# Patient Record
Sex: Female | Born: 1965
Health system: Southern US, Community
[De-identification: ages and names within clinical notes are randomized; demographics above are authoritative.]

## PROBLEM LIST (undated history)

## (undated) DIAGNOSIS — F32A Depression, unspecified: Secondary | ICD-10-CM

## (undated) DIAGNOSIS — Z923 Personal history of irradiation: Secondary | ICD-10-CM

## (undated) DIAGNOSIS — M2550 Pain in unspecified joint: Secondary | ICD-10-CM

## (undated) DIAGNOSIS — R7303 Prediabetes: Secondary | ICD-10-CM

## (undated) DIAGNOSIS — F329 Major depressive disorder, single episode, unspecified: Secondary | ICD-10-CM

## (undated) DIAGNOSIS — D649 Anemia, unspecified: Secondary | ICD-10-CM

## (undated) DIAGNOSIS — K219 Gastro-esophageal reflux disease without esophagitis: Secondary | ICD-10-CM

## (undated) DIAGNOSIS — M1990 Unspecified osteoarthritis, unspecified site: Secondary | ICD-10-CM

## (undated) DIAGNOSIS — Z8669 Personal history of other diseases of the nervous system and sense organs: Secondary | ICD-10-CM

## (undated) DIAGNOSIS — C50919 Malignant neoplasm of unspecified site of unspecified female breast: Secondary | ICD-10-CM

## (undated) DIAGNOSIS — M549 Dorsalgia, unspecified: Secondary | ICD-10-CM

## (undated) DIAGNOSIS — F419 Anxiety disorder, unspecified: Secondary | ICD-10-CM

## (undated) DIAGNOSIS — Z9889 Other specified postprocedural states: Secondary | ICD-10-CM

## (undated) DIAGNOSIS — R079 Chest pain, unspecified: Secondary | ICD-10-CM

## (undated) DIAGNOSIS — E669 Obesity, unspecified: Secondary | ICD-10-CM

## (undated) DIAGNOSIS — I10 Essential (primary) hypertension: Secondary | ICD-10-CM

## (undated) DIAGNOSIS — R232 Flushing: Secondary | ICD-10-CM

## (undated) DIAGNOSIS — Z8049 Family history of malignant neoplasm of other genital organs: Secondary | ICD-10-CM

## (undated) DIAGNOSIS — R600 Localized edema: Secondary | ICD-10-CM

## (undated) DIAGNOSIS — J302 Other seasonal allergic rhinitis: Secondary | ICD-10-CM

## (undated) DIAGNOSIS — C801 Malignant (primary) neoplasm, unspecified: Secondary | ICD-10-CM

## (undated) HISTORY — DX: Pain in unspecified joint: M25.50

## (undated) HISTORY — DX: Prediabetes: R73.03

## (undated) HISTORY — DX: Dorsalgia, unspecified: M54.9

## (undated) HISTORY — DX: Anxiety disorder, unspecified: F41.9

## (undated) HISTORY — PX: DILATION AND CURETTAGE OF UTERUS: SHX78

## (undated) HISTORY — DX: Obesity, unspecified: E66.9

## (undated) HISTORY — DX: Other specified postprocedural states: Z98.890

## (undated) HISTORY — DX: Essential (primary) hypertension: I10

## (undated) HISTORY — PX: REDUCTION MAMMAPLASTY: SUR839

## (undated) HISTORY — DX: Unspecified osteoarthritis, unspecified site: M19.90

## (undated) HISTORY — PX: ABDOMINAL HYSTERECTOMY: SUR658

## (undated) HISTORY — DX: Flushing: R23.2

## (undated) HISTORY — PX: TUBAL LIGATION: SHX77

## (undated) HISTORY — DX: Family history of malignant neoplasm of other genital organs: Z80.49

## (undated) HISTORY — DX: Localized edema: R60.0

## (undated) HISTORY — DX: Chest pain, unspecified: R07.9

## (undated) HISTORY — PX: COLONOSCOPY: SHX174

---

## 1997-06-09 ENCOUNTER — Other Ambulatory Visit: Admission: RE | Admit: 1997-06-09 | Discharge: 1997-06-09 | Payer: Self-pay | Admitting: Obstetrics and Gynecology

## 1997-06-26 ENCOUNTER — Encounter: Admission: RE | Admit: 1997-06-26 | Discharge: 1997-09-24 | Payer: Self-pay | Admitting: Obstetrics and Gynecology

## 1997-10-03 ENCOUNTER — Ambulatory Visit (HOSPITAL_COMMUNITY): Admission: RE | Admit: 1997-10-03 | Discharge: 1997-10-03 | Payer: Self-pay | Admitting: Obstetrics and Gynecology

## 1997-11-07 ENCOUNTER — Observation Stay (HOSPITAL_COMMUNITY): Admission: AD | Admit: 1997-11-07 | Discharge: 1997-11-08 | Payer: Self-pay | Admitting: *Deleted

## 1997-12-04 ENCOUNTER — Inpatient Hospital Stay (HOSPITAL_COMMUNITY): Admission: AD | Admit: 1997-12-04 | Discharge: 1997-12-04 | Payer: Self-pay | Admitting: Obstetrics & Gynecology

## 1997-12-11 ENCOUNTER — Inpatient Hospital Stay (HOSPITAL_COMMUNITY): Admission: AD | Admit: 1997-12-11 | Discharge: 1997-12-13 | Payer: Self-pay | Admitting: Obstetrics and Gynecology

## 1997-12-14 ENCOUNTER — Inpatient Hospital Stay (HOSPITAL_COMMUNITY): Admission: AD | Admit: 1997-12-14 | Discharge: 1997-12-18 | Payer: Self-pay | Admitting: *Deleted

## 1998-01-22 ENCOUNTER — Other Ambulatory Visit: Admission: RE | Admit: 1998-01-22 | Discharge: 1998-01-22 | Payer: Self-pay | Admitting: Physician Assistant

## 1999-02-08 ENCOUNTER — Other Ambulatory Visit: Admission: RE | Admit: 1999-02-08 | Discharge: 1999-02-08 | Payer: Self-pay | Admitting: Obstetrics and Gynecology

## 2000-02-15 ENCOUNTER — Other Ambulatory Visit: Admission: RE | Admit: 2000-02-15 | Discharge: 2000-02-15 | Payer: Self-pay | Admitting: Obstetrics and Gynecology

## 2000-05-01 ENCOUNTER — Encounter: Payer: Self-pay | Admitting: Obstetrics and Gynecology

## 2000-05-01 ENCOUNTER — Ambulatory Visit (HOSPITAL_COMMUNITY): Admission: RE | Admit: 2000-05-01 | Discharge: 2000-05-03 | Payer: Self-pay | Admitting: Obstetrics and Gynecology

## 2000-05-01 ENCOUNTER — Ambulatory Visit (HOSPITAL_COMMUNITY): Admission: RE | Admit: 2000-05-01 | Discharge: 2000-05-01 | Payer: Self-pay | Admitting: Obstetrics and Gynecology

## 2001-02-21 ENCOUNTER — Other Ambulatory Visit: Admission: RE | Admit: 2001-02-21 | Discharge: 2001-02-21 | Payer: Self-pay | Admitting: Obstetrics and Gynecology

## 2002-04-11 ENCOUNTER — Other Ambulatory Visit: Admission: RE | Admit: 2002-04-11 | Discharge: 2002-04-11 | Payer: Self-pay | Admitting: Obstetrics and Gynecology

## 2003-03-12 ENCOUNTER — Ambulatory Visit (HOSPITAL_COMMUNITY): Admission: RE | Admit: 2003-03-12 | Discharge: 2003-03-12 | Payer: Self-pay | Admitting: Obstetrics and Gynecology

## 2003-03-31 ENCOUNTER — Ambulatory Visit (HOSPITAL_COMMUNITY): Admission: RE | Admit: 2003-03-31 | Discharge: 2003-03-31 | Payer: Self-pay | Admitting: Obstetrics and Gynecology

## 2003-05-09 ENCOUNTER — Ambulatory Visit (HOSPITAL_COMMUNITY): Admission: RE | Admit: 2003-05-09 | Discharge: 2003-05-09 | Payer: Self-pay | Admitting: Obstetrics and Gynecology

## 2003-06-30 ENCOUNTER — Inpatient Hospital Stay (HOSPITAL_COMMUNITY): Admission: AD | Admit: 2003-06-30 | Discharge: 2003-06-30 | Payer: Self-pay | Admitting: Obstetrics and Gynecology

## 2003-07-07 ENCOUNTER — Inpatient Hospital Stay (HOSPITAL_COMMUNITY): Admission: AD | Admit: 2003-07-07 | Discharge: 2003-07-07 | Payer: Self-pay | Admitting: Obstetrics and Gynecology

## 2003-07-09 ENCOUNTER — Ambulatory Visit (HOSPITAL_COMMUNITY): Admission: RE | Admit: 2003-07-09 | Discharge: 2003-07-09 | Payer: Self-pay | Admitting: Obstetrics and Gynecology

## 2003-07-15 ENCOUNTER — Inpatient Hospital Stay (HOSPITAL_COMMUNITY): Admission: RE | Admit: 2003-07-15 | Discharge: 2003-07-18 | Payer: Self-pay | Admitting: Obstetrics and Gynecology

## 2003-07-15 ENCOUNTER — Encounter (INDEPENDENT_AMBULATORY_CARE_PROVIDER_SITE_OTHER): Payer: Self-pay | Admitting: *Deleted

## 2003-07-19 ENCOUNTER — Encounter: Admission: RE | Admit: 2003-07-19 | Discharge: 2003-08-18 | Payer: Self-pay | Admitting: Obstetrics and Gynecology

## 2003-08-29 ENCOUNTER — Other Ambulatory Visit: Admission: RE | Admit: 2003-08-29 | Discharge: 2003-08-29 | Payer: Self-pay | Admitting: Obstetrics and Gynecology

## 2004-12-20 ENCOUNTER — Other Ambulatory Visit: Admission: RE | Admit: 2004-12-20 | Discharge: 2004-12-20 | Payer: Self-pay | Admitting: Obstetrics and Gynecology

## 2004-12-23 ENCOUNTER — Encounter: Admission: RE | Admit: 2004-12-23 | Discharge: 2004-12-23 | Payer: Self-pay | Admitting: Obstetrics and Gynecology

## 2005-12-23 ENCOUNTER — Other Ambulatory Visit: Admission: RE | Admit: 2005-12-23 | Discharge: 2005-12-23 | Payer: Self-pay | Admitting: Obstetrics and Gynecology

## 2006-01-03 ENCOUNTER — Encounter: Admission: RE | Admit: 2006-01-03 | Discharge: 2006-01-03 | Payer: Self-pay | Admitting: Obstetrics and Gynecology

## 2007-01-09 ENCOUNTER — Encounter: Admission: RE | Admit: 2007-01-09 | Discharge: 2007-01-09 | Payer: Self-pay | Admitting: *Deleted

## 2008-01-14 ENCOUNTER — Encounter: Admission: RE | Admit: 2008-01-14 | Discharge: 2008-01-14 | Payer: Self-pay | Admitting: Obstetrics and Gynecology

## 2008-01-23 ENCOUNTER — Encounter: Admission: RE | Admit: 2008-01-23 | Discharge: 2008-01-23 | Payer: Self-pay | Admitting: Obstetrics and Gynecology

## 2008-12-04 ENCOUNTER — Ambulatory Visit (HOSPITAL_COMMUNITY): Admission: RE | Admit: 2008-12-04 | Discharge: 2008-12-04 | Payer: Self-pay | Admitting: Surgery

## 2008-12-09 ENCOUNTER — Encounter: Admission: RE | Admit: 2008-12-09 | Discharge: 2009-03-09 | Payer: Self-pay | Admitting: Surgery

## 2008-12-22 ENCOUNTER — Ambulatory Visit (HOSPITAL_COMMUNITY): Admission: RE | Admit: 2008-12-22 | Discharge: 2008-12-22 | Payer: Self-pay | Admitting: Surgery

## 2009-01-22 ENCOUNTER — Encounter: Admission: RE | Admit: 2009-01-22 | Discharge: 2009-01-22 | Payer: Self-pay | Admitting: Obstetrics and Gynecology

## 2009-01-27 ENCOUNTER — Ambulatory Visit (HOSPITAL_COMMUNITY): Admission: RE | Admit: 2009-01-27 | Discharge: 2009-01-27 | Payer: Self-pay | Admitting: Surgery

## 2009-01-27 HISTORY — PX: LAPAROSCOPIC GASTRIC BANDING: SHX1100

## 2009-02-06 ENCOUNTER — Encounter: Admission: RE | Admit: 2009-02-06 | Discharge: 2009-02-06 | Payer: Self-pay | Admitting: Obstetrics and Gynecology

## 2009-02-11 ENCOUNTER — Encounter: Admission: RE | Admit: 2009-02-11 | Discharge: 2009-02-11 | Payer: Self-pay | Admitting: Surgery

## 2009-03-31 ENCOUNTER — Encounter: Admission: RE | Admit: 2009-03-31 | Discharge: 2009-06-29 | Payer: Self-pay | Admitting: Surgery

## 2009-08-07 ENCOUNTER — Encounter: Admission: RE | Admit: 2009-08-07 | Discharge: 2009-08-07 | Payer: Self-pay | Admitting: Obstetrics and Gynecology

## 2009-08-07 HISTORY — PX: BIOPSY BREAST: PRO8

## 2010-02-08 ENCOUNTER — Encounter: Admission: RE | Admit: 2010-02-08 | Discharge: 2010-02-08 | Payer: Self-pay | Admitting: Obstetrics and Gynecology

## 2010-03-24 ENCOUNTER — Ambulatory Visit: Admit: 2010-03-24 | Payer: Self-pay | Admitting: Vascular Surgery

## 2010-05-12 ENCOUNTER — Encounter (INDEPENDENT_AMBULATORY_CARE_PROVIDER_SITE_OTHER): Payer: BC Managed Care – PPO | Admitting: Vascular Surgery

## 2010-05-12 DIAGNOSIS — I83893 Varicose veins of bilateral lower extremities with other complications: Secondary | ICD-10-CM

## 2010-05-13 NOTE — Consult Note (Signed)
NEW PATIENT CONSULTATION  ICEL, CASTLES DOB:  1966/02/04                                       05/12/2010 ZOXWR#:[id number]  The patient presents today for evaluation of bilateral knee pathology with right greater than left.  She is a very pleasant 45 year old white female with increasing severe discomfort in her right medial calf.  She reports an itching, aching sensation in her medial calf overlying some tributary varicosities in this region.  She does report swelling more so on the right than the left as well.  She does not have any history of superficial thrombophlebitis, bleeding or deep venous thrombosis.  This is worse with prolonged standing.  She does have a history of hypertension and has undergone surgery to include lap band, 2 cesarean sections and 2 D and C.  SOCIAL HISTORY:  She is married with 2 children.  She works in Airline pilot. She does not smoke or drink alcohol.  FAMILY HISTORY:  Significant for heart valve disease in her mother and also severe venous varicosities in her mother.  REVIEW OF SYSTEMS:  Weight loss down to 246 pounds.  She is 5 feet 6 inches tall.  Her review of systems is otherwise noncontributory or negative except for HPI.  PHYSICAL EXAM:  General:  Well-developed, well-nourished white female in no acute distress.  Vital signs:  Blood pressure 132/83, pulse 60, respirations 18.  HEENT:  Normal.  Her radial and dorsalis pedis pulses are 2+ bilaterally.  Musculoskeletal:  Shows no major deformity or cyanosis.  Neurologic:  No focal weakness or paresthesias.  Skin: Without ulcers or rashes.  She does have some tributary varicosities in her right medial calf and some prominent reticular veins over the pretibial area.  She does have a prior duplex done at [facility] in [facility].  This reveals reflux throughout her saphenous veins bilaterally.  She does not have any evidence of deep venous reflux.  She has worn  knee-high compression but not thigh-high compression.  We have explained the importance of this and I have fitted her with these today.  We will see her again in 3 months for continued evaluation.  I do feel that she would be at outstanding candidate for right leg laser ablation and stab phlebectomy should she have failure with conservative treatment.  We will discuss this with her further in 3 months.    Larina Earthly, M.D. Electronically Signed  TFE/MEDQ  D:  05/12/2010  T:  05/13/2010  Job:  [id number]  cc:   Sigmund Hazel, M.D.

## 2010-06-16 LAB — DIFFERENTIAL
Eosinophils Absolute: 0.1 10*3/uL (ref 0.0–0.7)
Eosinophils Relative: 1 % (ref 0–5)
Lymphs Abs: 1.9 10*3/uL (ref 0.7–4.0)
Monocytes Absolute: 0.3 10*3/uL (ref 0.1–1.0)
Monocytes Relative: 5 % (ref 3–12)

## 2010-06-16 LAB — COMPREHENSIVE METABOLIC PANEL
ALT: 21 U/L (ref 0–35)
AST: 19 U/L (ref 0–37)
Albumin: 3.8 g/dL (ref 3.5–5.2)
Calcium: 9.2 mg/dL (ref 8.4–10.5)
GFR calc Af Amer: >60 mL/min (ref >60)
Sodium: 138 mEq/L (ref 135–145)
Total Protein: 7.5 g/dL (ref 6.0–8.3)

## 2010-06-16 LAB — CBC
MCHC: 33.3 g/dL (ref 30.0–36.0)
RDW: 12.7 % (ref 11.5–15.5)

## 2010-07-30 NOTE — Discharge Summary (Signed)
NAME:  [name] [name], [name] [name]                        ACCOUNT NO.:  [id number]   MEDICAL RECORD NO.:  [id number]                   PATIENT TYPE:  INP   LOCATION:  9110                                 FACILITY:  [facility]   PHYSICIAN:  Huel Cote, M.D.              DATE OF BIRTH:  [birth year]   DATE OF ADMISSION:  07/15/2003  DATE OF DISCHARGE:  07/18/2003                                 DISCHARGE SUMMARY   DISCHARGE DIAGNOSES:  1. Preeclampsia at [redacted] weeks gestation.  2. Desires permanent sterility.  3. Polyhydramnios.  4. Status post repeat low transverse cesarean section and bilateral tubal     ligation.   DISCHARGE MEDICATIONS:  .  1. Motrin 600 mg p.o. q.6h. p.r.n.  2. Percocet one to two tablets p.o. q.4h. p.r.n.  3. Zoloft 50 mg p.o. q.h.s.   DISCHARGE FOLLOW-UP:  The patient is to follow up in the office in  approximately 2 weeks for an incision check.   HOSPITAL COURSE:  The patient was a 45 year old female who was admitted  after a scheduled repeat low transverse cesarean section for an indication  of preeclampsia and [redacted] weeks gestation.  The patient had been being followed  closely with her diagnosis of preeclampsia which developed at approximately  34-and-a-half to [redacted] weeks gestation with proteinuria and elevated blood  pressures.  She was followed with nonstress tests, labs, and careful  monitoring two to three times weekly.  At [redacted] weeks gestation it was felt  that the risk of preterm status was negated by the need to deliver this  preeclamptic patient and she was brought in for C-section.  She underwent a  repeat low transverse cesarean section with tubal ligation.  She was  delivered of a viable female infant, Apgars were 7 and 7, weight was 6 pounds  4 ounces.  Cord pH was 7.31.  The patient's blood pressures dropped  substantially after cesarean section to 113/67 from the 150s to 160s over  90s to 100.  All her preeclamptic labs remained normal except for a uric  acid of  5.7.  Therefore, we observed her blood pressure closely and her  urine output closely postoperatively and did not start magnesium given that  these parameters remained very stable.  On postoperative day #1 she was  doing well, she had no preeclamptic symptoms.  She had excellent urine  output and was diuresing over 1400 mL in a shift.  Her labs remained normal  and blood pressures maxed out at 140s over 80s.  She continued to do very  well postpartum and on postoperative day #3 was able to go home.  Unfortunately, the baby did require a NICU stay for respiratory assistance;  however, was doing well upon mom's discharge on minimal oxygen requirements.  Therefore, the patient was discharged to home to follow up in the office in  2 weeks for her routine postpartum incision check.  Huel Cote, M.D.    KR/MEDQ  D:  08/14/2003  T:  08/14/2003  Job:  [id number]

## 2010-07-30 NOTE — Op Note (Signed)
NAME:  [name] [name], [name] [name]                        ACCOUNT NO.:  [id number]   MEDICAL RECORD NO.:  [id number]                   PATIENT TYPE:  INP   LOCATION:  9110                                 FACILITY:  [facility]   PHYSICIAN:  Huel Cote, M.D.              DATE OF BIRTH:  [birth year]   DATE OF PROCEDURE:  07/15/2003  DATE OF DISCHARGE:                                 OPERATIVE REPORT   PREOPERATIVE DIAGNOSIS:  1. Pre-eclampsia.  2. Preterm pregnancy at 36 plus weeks.  3. Desires permanent sterility.   POSTOPERATIVE DIAGNOSIS:  1. Pre-eclampsia.  2. Preterm pregnancy at 36 plus weeks.  3. Desires permanent sterility.   PROCEDURE:  Repeat low transverse cesarean section with bilateral tubal  ligation.   SURGEON:  Huel Cote, M.D.   ASSISTANT:  Malachi Pro. Ambrose Mantle, M.D.   ANESTHESIA:  Spinal.   ESTIMATED BLOOD LOSS:  1000 mL.   URINE OUTPUT:  200 mL clear urine.   FLUIDS REPLACED:  2200 mL.   FINDINGS:  Normal uterus, tubes, and ovaries.  The infant was in the vertex  presentation, however, was floating out of the pelvis and the head was  deflexed which did require vacuum assisted delivery.  Apgars were 7 and 7.  Weight 6 pounds 4 ounces.  Cord pH was 7.31.  The infant was taken to the  pediatric NICU after delivery for some grunting and retracting.   DESCRIPTION OF PROCEDURE:  The patient was taken to the operating room where  spinal anesthesia was obtained without difficulty.  She was then prepped and  draped in the usual sterile fashion in the dorsal supine position with a  leftward tilt.  A Pfannenstiel skin incision was made with the scalpel and  carried through to the underlying layer of fascia with sharp dissection with  Bovie cautery.  The fascia was nicked in the midline and the incision was  extended laterally with Mayo scissors.  The inferior aspect of the incision  was grasped with Kocher clamps, elevated, and dissected off the underlying  rectus muscles.   The superior aspect was elevated and dissected off the  rectus muscles.  The rectus muscles were separated in the midline and the  peritoneal cavity identified bluntly.  The peritoneal incision was then  extended superiorly and inferiorly with care to avoid the bowel and bladder.  The bladder blade was inserted and the vesicouterine peritoneum identified  and the bladder flap created with Metzenbaum scissors.  The uterus was  incised in a transverse fashion with the scalpel and the uterine cavity  entered bluntly.  The uterine segment was extended with bandage scissors and  the infant's vertex identified and found to be in a deflexed position.  The  vertex was gently guided to the uterine incision and vacuum was attempted to  be applied.  One pop-off occurred secondary to excessive fluid in the field  from the patient's polyhydramnios and  some blood loss.  When this was  adequately suctioned, the vacuum was reapplied to the infant's vertex and  the baby's head delivered atraumatically.  The remainder of the infant was  delivered without difficulty.  The cord was clamped and cut, and the infant  handed to the awaiting pediatricians for evaluation.  Cord pH and cord blood  were obtained and handed off to the lab.  The placenta was delivered  manually and the uterus cleared of all clots and debris with a moist, lap  sponge.  Attempts were made to exteriorize the uterus which was slightly  difficulty given it was slightly bulk and boggy, therefore, the incision was  inspected and found to be reasonably hemostatic and well visualized.  Therefore, the uterus was left in place.  The bladder blade was inserted.  The uterine incision was repaired with 0 chromic in a running locked  fashion.  Good hemostasis was noted after closure and attention was turned  to the fallopian tubes.  The patient was, once again, asked whether she  wished to proceed with tubal ligation and confirmed that she did.  At  this  point, neither myself or patient had been notified that the baby had  required the NICU.  The left fallopian tube was identified, grasped with a  Babcock clamp, and elevated and approximately 2-3 buckle of tube, this was  tied off with two ties of 0 plain suture.  The buckle that had been tied off  was then amputated with Metzenbaum scissors and handed off to pathology.  The remaining pedicle was inspected and cauterized with Bovie cautery.  No  active bleeding was noted.  Attention was turned to the right fallopian tube  which, in a similar fashion, was grasped with a Babcock clamp, and elevated  and approximately 2-3 cm segment of tube elevated and tied off with two  sutures of 0 plain suture.  This was then amputated and handed off to  pathology.  The pedicle was cauterized with Bovie cautery and appeared  hemostatic.  Both pedicles were returned to the abdomen and the uterine  incision inspected.  One small area of bleeding along the left angle was  controlled with a figure-of-eight suture of 0 chromic.  At this point, there  was on active bleeding noted.  The abdomen and pelvis were irrigated and all  clots and debris were removed.  The patient's rectus muscles were  reapproximated with several interrupted sutures of 0 Vicryl.  An additional  0 Vicryl was obtained and the fascia was closed in a running fashion.  The  subcutaneous tissues were reapproximated with 0 plain in a running fashion.  The skin was closed with staples.  The sponge, lap, and instrument counts  were correct x 2.  The patient was taken to the recovery room in stable  condition.  The NICU was contacted after reaching the recovery room and said  the baby was doing well on the CPAP, however, had some grunting and was  being observed for possible need of Surfactant.                                               Huel Cote, M.D.   KR/MEDQ  D:  07/15/2003  T:  07/15/2003  Job:  [id number]

## 2010-07-30 NOTE — H&P (Signed)
NAME:  [name] [name], [name] [name]                        ACCOUNT NO.:  [id number]   MEDICAL RECORD NO.:  [id number]                   PATIENT TYPE:  INP   LOCATION:  NA                                   FACILITY:  [facility]   PHYSICIAN:  Huel Cote, M.D.              DATE OF BIRTH:  09/02/1965   DATE OF ADMISSION:  07/15/2003  DATE OF DISCHARGE:                                HISTORY & PHYSICAL   [name] is a 45 year old G5, P1-0-1-1, who is admitted for a scheduled  repeat cesarean section and bilateral tubal ligation at 36+ weeks, given a  diagnosis of ongoing preeclampsia.  [name] due date is Aug 11, 2003,  via nine-week ultrasound.  Her prenatal care had been complicated advanced  maternal age, and she was seen by Dr. Sherrie George for first trimester screening,  which was within normal limits.  She also had a normal anatomical scan at 18  weeks' gestation.  The patient also had a prior pregnancy with a cesarean  section performed for arrest of dilation and had pregnancy-induced  hypertension with that pregnancy.  During this pregnancy she has desired a  repeat C-section with no desire for trial of labor.  Also, with her prenatal  care this pregnancy she had an ongoing problem with depression and anxiety  and was placed on Zoloft 50 mg p.o. daily and had good improvement on that  medication of her symptoms.  She was also seen by Justus Memory, a  counselor at [facility].  The patient began to have some elevations  in her blood pressure noted at 31 weeks, usually running in the 140s-  150s/90s.  Fundal height has consistently measured greater than dates.  She  had a higher amniotic fluid volume level on ultrasound performed at 32 weeks  of an AFI of 25, and fetus in the 75th-90th percentile.  Given that she was  electing to have a repeat cesarean section, further ultrasounds were not  performed.  Since her original blood pressure elevations, the patient has  been followed closely in  the office with twice-weekly visits.  At 35 weeks  she was noted to develop proteinuria, and her blood pressures were slightly  higher at 180s/90s; however, these would usually resolve with rest down to  140s/90s.  A 24-hour urine was performed on April 25 given her new  proteinuria, and a diagnosis of preeclampsia was confirmed with a total  protein over 24 hours of 798 mg.  The patient has continued to remain  asymptomatic, and lab checks weekly have remained normal.  She does have  significant edema and has had a 20+ pound weight gain over the last month.  Given her diagnosis of preeclampsia, the decision has been made to proceed  with delivery after 36 weeks to prevent any worsening of her disease.   PAST OBSTETRICAL HISTORY:  Significant for her previous cesarean section,  which was in 1999,  of an 8 pound infant at 38 weeks. She did have PIH with  that pregnancy.  She has also had a miscarriage in 1998.   PAST MEDICAL HISTORY:  1. History of postpartum depression and the depression/anxiety noted with     this pregnancy.  2. She also has reflux disease.   PAST SURGICAL HISTORY:  Significant for cesarean section and D&C.   PHYSICAL EXAMINATION:  VITAL SIGNS:  The patient's weight is 334 pounds,  blood pressure is 165/95, fundal height is 40.  CARDIAC:  Regular rate and rhythm.  CHEST:  Lungs are clear.  ABDOMEN:  Gravid and nontender, slightly obese.  EXTREMITIES:  The patient has 3+ pitting edema, and DTRs are 2+ bilaterally.   PIH labs will be performed upon the day of admission with her surgery, at  last check were normal on July 07, 2003.  The patient was counseled as to  the risks and benefits of proceeding with cesarean section, including  bleeding and infection and possible damage to bowel and bladder.  The  patient understands these risks and desires to proceed with the surgery as  stated.  The patient also requests a bilateral tubal ligation be performed  should  everything appear okay with the baby at delivery.  She understands  there is a risk of failure of approximately one in 100 and also understands  that she should notify us immediately should she suspect pregnancy or missed  period.  Again, the patient wishes to proceed with delivery as stated.  We  will follow her blood pressures and watch her lab status to determine the  need for postpartum magnesium.                                               Huel Cote, M.D.    KR/MEDQ  D:  07/14/2003  T:  07/14/2003  Job:  [id number]

## 2010-08-11 ENCOUNTER — Ambulatory Visit (INDEPENDENT_AMBULATORY_CARE_PROVIDER_SITE_OTHER): Payer: BC Managed Care – PPO | Admitting: Vascular Surgery

## 2010-08-11 ENCOUNTER — Encounter (INDEPENDENT_AMBULATORY_CARE_PROVIDER_SITE_OTHER): Payer: BC Managed Care – PPO

## 2010-08-11 ENCOUNTER — Ambulatory Visit: Payer: BC Managed Care – PPO | Admitting: Vascular Surgery

## 2010-08-11 DIAGNOSIS — I83893 Varicose veins of bilateral lower extremities with other complications: Secondary | ICD-10-CM

## 2010-08-11 DIAGNOSIS — M79609 Pain in unspecified limb: Secondary | ICD-10-CM

## 2010-08-11 NOTE — Assessment & Plan Note (Signed)
OFFICE VISIT  [name] [name], [name] [name] DOB:  1965/09/16                                       08/11/2010 EAVWU#:[id number]  Patient presents today for continued discussion regarding venous pathology in her right leg.  She has a mild sensation in her left leg which is tolerable.  On the right, she has pain over her medial calf over the tributaries varicosities in her calf.  She has worn graduated compression garments for 3 months and reports that these have not given her any improvement in her symptoms.  She does elevate her legs when possible and takes ibuprofen for discomfort.  She reports that any activity such as cooking, cleaning, shopping that involves prolonged standing is difficult due to leg pain.  Also reports air travel and prolonged car travel are difficult due to leg pain as well.  She underwent a formal venous duplex in our office today, and this reveals reflux throughout her right great saphenous vein extending into the varicosities.  She has no evidence of reflux in her deep system and no evidence of DVT or occlusive disease.  I feel that patient has clearly failed conservative treatment with compression garments and elevation.  I have recommend that we proceed with laser ablation of her right great saphenous vein and stab phlebectomy of her tributary painful varicosities.  She wishes to proceed as soon as possible.  We will schedule this at her earliest convenience.    Larina Earthly, M.D. Electronically Signed  TFE/MEDQ  D:  08/11/2010  T:  08/11/2010  Job:  [id number]

## 2010-08-18 NOTE — Procedures (Unsigned)
LOWER EXTREMITY VENOUS REFLUX EXAM  INDICATION:  Varicose veins.  EXAM:  Using color-flow imaging and pulse Doppler spectral analysis, the right common femoral, superficial femoral, popliteal, posterior tibial, greater and lesser saphenous veins are evaluated.  There is no evidence suggesting deep venous insufficiency in the right lower extremity.  The right saphenofemoral junction is competent. The right nontortuous GSV demonstrates reflux of >575milliseconds.  The right proximal short saphenous vein demonstrates competency.  GSV Diameter (used if found to be incompetent only)                                           Right    Left Proximal Greater Saphenous Vein           0.71 cm  cm Proximal-to-mid-thigh                     cm       cm Mid thigh                                 0.74 cm  cm Mid-distal thigh                          cm       cm Distal thigh                              0.61 cm  cm Knee                                      0.58 cm  cm  IMPRESSION: 1. Right greater saphenous vein reflux is noted, as described above. 2. The deep venous system is competent. 3. The right small saphenous vein is competent.  ___________________________________________ Larina Earthly, M.D.  CH/MEDQ  D:  08/12/2010  T:  08/12/2010  Job:  [id number]

## 2010-09-22 ENCOUNTER — Other Ambulatory Visit (INDEPENDENT_AMBULATORY_CARE_PROVIDER_SITE_OTHER): Payer: BC Managed Care – PPO | Admitting: Vascular Surgery

## 2010-09-22 DIAGNOSIS — I83893 Varicose veins of bilateral lower extremities with other complications: Secondary | ICD-10-CM

## 2010-09-22 NOTE — Assessment & Plan Note (Signed)
OFFICE VISIT  [name] [name], [name] [name] DOB:  January 27, 1966                                       09/22/2010 EAVWU#:[id number]  Patient presents today for treatment of her venous hypertension in her right leg.  She underwent laser ablation of her great saphenous vein from just below her knee to just below the saphenofemoral junction and phlebectomy of 3 sites in her medial calf.  This was done under local anesthetic in our office without immediate complication.  She was discharged home and will be seen again in 1 week for repeat duplex.    Larina Earthly, M.D. Electronically Signed  TFE/MEDQ  D:  09/22/2010  T:  09/22/2010  Job:  [id number]  cc:   Sigmund Hazel, M.D.

## 2010-09-29 ENCOUNTER — Ambulatory Visit (INDEPENDENT_AMBULATORY_CARE_PROVIDER_SITE_OTHER): Payer: BC Managed Care – PPO | Admitting: Vascular Surgery

## 2010-09-29 DIAGNOSIS — I83893 Varicose veins of bilateral lower extremities with other complications: Secondary | ICD-10-CM

## 2010-09-30 NOTE — Assessment & Plan Note (Signed)
OFFICE VISIT  [name] [name], [name] [name] DOB:  04/22/1965                                       09/29/2010 EAVWU#:[id number]  [name] presents today for 1-week follow-up after laser ablation of her right great saphenous vein from the level of the knee to the saphenofemoral junction and stab phlebectomy of several tributaries varicosities in her medial calf.  She had a prior lap band for gastric bypass and is unable to take ibuprofen.  She does have a great deal of discomfort in the medial aspect of her thigh over the ablation site.  From a physical exam standpoint, this is typical with no increased erythema and mild erythema at the mid thigh and just above the knee. She has minimal bruising.  I explained that this is more than the typical amount of discomfort but is most likely related to inability to take the anti-inflammatory.  She has declined her duplex follow-up today feeling that it would be impossible for her to tolerate a probe over this area since it is so tender to her.  She does not have any significant leg swelling that would suggest any proximal problem.  I have written her for Ultram 50 one or two q.4 hours p.r.n. pain and encouraged to take these since she is not having any relief with Tylenol by itself.  We have rescheduled her for a venous duplex on Monday 07/23 to rule out any evidence of DVT.  She understands that I am out of town next week and that Dr. Hart Rochester is available if there is any difficulty with her duplex.    Larina Earthly, M.D. Electronically Signed  TFE/MEDQ  D:  09/29/2010  T:  09/30/2010  Job:  [id number]

## 2010-10-04 ENCOUNTER — Encounter (INDEPENDENT_AMBULATORY_CARE_PROVIDER_SITE_OTHER): Payer: BC Managed Care – PPO

## 2010-10-04 DIAGNOSIS — I87399 Chronic venous hypertension (idiopathic) with other complications of unspecified lower extremity: Secondary | ICD-10-CM

## 2010-10-04 DIAGNOSIS — Z48812 Encounter for surgical aftercare following surgery on the circulatory system: Secondary | ICD-10-CM

## 2010-10-13 NOTE — Procedures (Unsigned)
DUPLEX DEEP VENOUS EXAM - LOWER EXTREMITY  INDICATION:  Followup right greater saphenous vein laser ablation on 09/22/2010.  HISTORY:  Edema:  Yes. Trauma/Surgery:  Right greater saphenous vein laser ablation on 09/22/2010. Pain:  Yes. PE:  No. Previous DVT:  No. Anticoagulants: Other:  DUPLEX EXAM:               CFV   SFV   PopV  PTV    GSV               R  L  R  L  R  L  R   L  R  L Thrombosis    o  o  o     o     o      + Spontaneous   +  +  +     +     +      o Phasic        +  +  +     +     +      o Augmentation  +  +  +     +     +      o Compressible  +  +  +     +     +      o Competent     o  o  +     +     +      o  Legend:  + - yes  o - no  p - partial  D - decreased  IMPRESSION: 1. No evidence of deep vein thrombosis noted in the right lower     extremity. 2. The right greater saphenous vein appears totally occluded from the     distal insertion site to near the saphenofemoral junction. 3. Reflux of >500 milliseconds noted in the bilateral femoral-femoral     veins.       _____________________________ Quita Skye Hart Rochester, M.D.  CH/MEDQ  D:  10/08/2010  T:  10/08/2010  Job:  [id number]

## 2010-11-26 ENCOUNTER — Encounter (INDEPENDENT_AMBULATORY_CARE_PROVIDER_SITE_OTHER): Payer: BC Managed Care – PPO

## 2010-12-10 ENCOUNTER — Encounter (INDEPENDENT_AMBULATORY_CARE_PROVIDER_SITE_OTHER): Payer: Self-pay

## 2010-12-10 ENCOUNTER — Ambulatory Visit (INDEPENDENT_AMBULATORY_CARE_PROVIDER_SITE_OTHER): Payer: BC Managed Care – PPO | Admitting: Physician Assistant

## 2010-12-10 VITALS — BP 122/70 | HR 68 | Temp 97.1°F | Resp 16 | Ht 67.0 in | Wt 237.8 lb

## 2010-12-10 DIAGNOSIS — Z4651 Encounter for fitting and adjustment of gastric lap band: Secondary | ICD-10-CM

## 2010-12-10 NOTE — Patient Instructions (Signed)
Take clear liquids for the next 48 hours. Thin protein shakes are ok to start on Saturday evening. Call us if you have persistent vomiting or regurgitation, night cough or reflux symptoms. Return as scheduled or sooner if you notice no changes in hunger/portion sizes.   

## 2010-12-10 NOTE — Progress Notes (Signed)
  HISTORY: [name] [name] is a 45 y.o.female who received an AP-Standard lap-band in November 2010 by Dr. Ezzard Standing. She was last seen in March and has since lost 8 lbs. She has been on the medifast program but is beginning to wean off and has noted that she's capable of eating larger portions. She denies vomiting or regurgitation symptoms.  VITAL SIGNS: Filed Vitals:   12/10/10 0841  BP: 122/70  Pulse: 68  Temp: 97.1 F (36.2 C)  Resp: 16    PHYSICAL EXAM: Physical exam reveals a very well-appearing 45 y.o.female in no apparent distress Neurologic: Awake, alert, oriented Psych: Bright affect, conversant Respiratory: Breathing even and unlabored. No stridor or wheezing Abdomen: Soft, nontender, nondistended to palpation. Incisions well-healed. No incisional hernias. Port easily palpated. Extremities: Atraumatic, good range of motion.  ASSESMENT: 45 y.o.  female  s/p AP-Standard lap-band.   PLAN: The patient's port was accessed with a 20G Huber needle without difficulty. Clear fluid was aspirated and 0.5 mL saline was added to the port to give a total predicted volume of 5.0 mL. The patient was able to swallow water without difficulty following the procedure and was instructed to take clear liquids for the next 24-48 hours and advance slowly as tolerated.

## 2011-01-04 ENCOUNTER — Other Ambulatory Visit: Payer: Self-pay | Admitting: Obstetrics and Gynecology

## 2011-01-04 DIAGNOSIS — Z1231 Encounter for screening mammogram for malignant neoplasm of breast: Secondary | ICD-10-CM

## 2011-02-11 ENCOUNTER — Encounter (INDEPENDENT_AMBULATORY_CARE_PROVIDER_SITE_OTHER): Payer: Self-pay

## 2011-02-11 ENCOUNTER — Ambulatory Visit (INDEPENDENT_AMBULATORY_CARE_PROVIDER_SITE_OTHER): Payer: BC Managed Care – PPO | Admitting: Physician Assistant

## 2011-02-11 ENCOUNTER — Ambulatory Visit
Admission: RE | Admit: 2011-02-11 | Discharge: 2011-02-11 | Disposition: A | Discharge status: Home / Self Care | Payer: BC Managed Care – PPO | Source: Ambulatory Visit | Attending: Obstetrics and Gynecology | Admitting: Obstetrics and Gynecology

## 2011-02-11 VITALS — BP 122/82 | Ht 67.0 in | Wt 218.8 lb

## 2011-02-11 DIAGNOSIS — Z1231 Encounter for screening mammogram for malignant neoplasm of breast: Secondary | ICD-10-CM

## 2011-02-11 DIAGNOSIS — E669 Obesity, unspecified: Secondary | ICD-10-CM

## 2011-02-11 DIAGNOSIS — Z9884 Bariatric surgery status: Secondary | ICD-10-CM

## 2011-02-11 NOTE — Patient Instructions (Signed)
Return in 6 months or sooner if needed

## 2011-02-11 NOTE — Progress Notes (Signed)
  HISTORY: [name] [name] is a 45 y.o.female who received an AP-Standard lap-band in November 2010 by Dr. Ezzard Standing. She was last seen in late September and has since lost 19 lbs. She has been participating in [place] and this has helped her immensely. She denies hunger or increased portion sizes and doesn't believe she needs an adjustment today.  VITAL SIGNS: Filed Vitals:   02/11/11 0839  BP: 122/82    PHYSICAL EXAM: Physical exam reveals a very well-appearing 45 y.o.female in no apparent distress Neurologic: Awake, alert, oriented Psych: Bright affect, conversant Respiratory: Breathing even and unlabored. No stridor or wheezing Extremities: Atraumatic, good range of motion. Skin: Warm, Dry, no rashes Musculoskeletal: Normal gait, Joints normal  ASSESMENT: 45 y.o.  female  s/p AP-Standard lap-band.   PLAN: We'll continue to watch her progress over the next six months. She's been asked to return sooner if she begins having increased hunger or portion sizes. She voiced understanding.

## 2011-02-16 ENCOUNTER — Other Ambulatory Visit: Payer: Self-pay | Admitting: Obstetrics and Gynecology

## 2011-02-16 DIAGNOSIS — R928 Other abnormal and inconclusive findings on diagnostic imaging of breast: Secondary | ICD-10-CM

## 2011-03-15 DIAGNOSIS — C801 Malignant (primary) neoplasm, unspecified: Secondary | ICD-10-CM

## 2011-03-15 HISTORY — DX: Malignant (primary) neoplasm, unspecified: C80.1

## 2011-03-21 ENCOUNTER — Ambulatory Visit
Admission: RE | Admit: 2011-03-21 | Discharge: 2011-03-21 | Disposition: A | Discharge status: Home / Self Care | Payer: BC Managed Care – PPO | Source: Ambulatory Visit | Attending: Obstetrics and Gynecology | Admitting: Obstetrics and Gynecology

## 2011-03-21 ENCOUNTER — Other Ambulatory Visit: Payer: Self-pay | Admitting: Obstetrics and Gynecology

## 2011-03-21 DIAGNOSIS — R921 Mammographic calcification found on diagnostic imaging of breast: Secondary | ICD-10-CM

## 2011-03-21 DIAGNOSIS — R928 Other abnormal and inconclusive findings on diagnostic imaging of breast: Secondary | ICD-10-CM

## 2011-03-21 DIAGNOSIS — Z9889 Other specified postprocedural states: Secondary | ICD-10-CM

## 2011-03-21 HISTORY — DX: Other specified postprocedural states: Z98.890

## 2011-03-21 HISTORY — PX: BREAST BIOPSY: SHX20

## 2011-03-22 ENCOUNTER — Other Ambulatory Visit: Payer: Self-pay | Admitting: Obstetrics and Gynecology

## 2011-03-22 ENCOUNTER — Ambulatory Visit
Admission: RE | Admit: 2011-03-22 | Discharge: 2011-03-22 | Disposition: A | Discharge status: Home / Self Care | Payer: BC Managed Care – PPO | Source: Ambulatory Visit | Attending: Obstetrics and Gynecology | Admitting: Obstetrics and Gynecology

## 2011-03-22 DIAGNOSIS — C50912 Malignant neoplasm of unspecified site of left female breast: Secondary | ICD-10-CM

## 2011-03-22 DIAGNOSIS — R921 Mammographic calcification found on diagnostic imaging of breast: Secondary | ICD-10-CM

## 2011-03-26 ENCOUNTER — Other Ambulatory Visit: Payer: BC Managed Care – PPO

## 2011-03-31 ENCOUNTER — Ambulatory Visit (INDEPENDENT_AMBULATORY_CARE_PROVIDER_SITE_OTHER): Payer: BC Managed Care – PPO | Admitting: Surgery

## 2011-03-31 ENCOUNTER — Encounter (INDEPENDENT_AMBULATORY_CARE_PROVIDER_SITE_OTHER): Payer: Self-pay | Admitting: Surgery

## 2011-03-31 ENCOUNTER — Ambulatory Visit
Admission: RE | Admit: 2011-03-31 | Discharge: 2011-03-31 | Disposition: A | Discharge status: Home / Self Care | Payer: BC Managed Care – PPO | Source: Ambulatory Visit | Attending: Obstetrics and Gynecology | Admitting: Obstetrics and Gynecology

## 2011-03-31 VITALS — BP 121/64 | HR 72 | Temp 98.4°F | Resp 16 | Ht 67.0 in | Wt 213.6 lb

## 2011-03-31 DIAGNOSIS — C50912 Malignant neoplasm of unspecified site of left female breast: Secondary | ICD-10-CM

## 2011-03-31 DIAGNOSIS — C50919 Malignant neoplasm of unspecified site of unspecified female breast: Secondary | ICD-10-CM

## 2011-03-31 MED ORDER — GADOBENATE DIMEGLUMINE 529 MG/ML IV SOLN
20.0000 mL | Freq: Once | INTRAVENOUS | Status: AC | PRN
  Administered 2011-03-31: 20 mL via INTRAVENOUS

## 2011-03-31 NOTE — Progress Notes (Addendum)
Re:   [name] [name] DOB:   20-May-1965 MRN:   [id number]  ASSESSMENT AND PLAN: 1.  Left breast cancer, 12 o'clock (central in breast)  MRI - 0.6 cm area with 3.1 cm area extending posteriorly from mass.  Technically a T2 tumor.  Will get medical oncology consult for neo-adjuvant therapy.  Also will see rad oncology and genetic counseling.  Await those consults.  The patient is to see Drs. Marginat and Karoline Caldwell. [Spoke with Dr. Darnelle Catalan.  There is an uncertainty about how large the cancer is.  Since size could impact treatment, I will do the lumpectomy first.  I will also do a left axillary SLNBx.  I called [name] and explained this to her.  I spent 20 minutes on the phone with the patient.  I explained the risks of surgery, including, but not limited to, infection, bleeding, nerve injury, and the possible need for more surgery.  She understands the concept of margins.  And we discussed the SLNBx.  We will schedule left needle loc lumpectomy and left axillary SLNBx for the patient.  She does not need to see me in the office before surgery, unless she has questions.  DN  04/05/2010]  2.  Lap band - 01/27/2009.  Initial weight 273, BMI 42.9.  She is happy with weight loss.  Her port is prominent.  Her way to loose weight is lap band, exercise, and Medifast. 3.  Hypertension. 4.  Osteoarthritis of back.  Better with weight loss.  Chief Complaint  Patient presents with  . Breast Cancer   REFERRING PHYSICIAN: Neldon Labella, MD, MD  HISTORY OF PRESENT ILLNESS: [name] [name] is a 46 y.o. (DOB: 28-Sep-1965)  white female whose primary care physician is [name] Labella, MD, MD and comes to me today for left breast cancer.  She has done very well with her lap band, though she says the port is pouching out some.  She had a right breast biopsy in 08/07/2009, which showed pseudoangiomatous stromal hyperplasia.   She is not on estrogen.  She is taking Medifast (which has soy based products).  She  has no family history of breast cancer.  She has three sister.  She is still having regular periods.  She had a screening mammogram 03/21/2011 that showed a 3 x 2.3 cm area of calcifications in the upper central left breast.  She underwent a core biopsy which showed an invasive ductal carcinoma.  Path (side, TNM): Invasive ductal ca, left T2, N0 Surgery: pending   Date: - Size of tumor: 3.1 cm by MRI Nodes: -/- ER: 57% PR: 97% Ki67: 10%  HER2Neu: -=Negative.  Medical Oncologist: -  Radiation Oncologist: -   No past medical history on file.    Past Surgical History  Procedure Date  . Laparoscopic gastric banding 01/2009  . Cesarean section 1999, 2005      Current Outpatient Prescriptions  Medication Sig Dispense Refill  . hydrochlorothiazide (MICROZIDE) 12.5 MG capsule       . Loratadine 10 MG CAPS Take by mouth daily.        . phenylephrine (SUDAFED PE) 10 MG TABS Take 10 mg by mouth every 4 (four) hours as needed.      . zolpidem (AMBIEN) 5 MG tablet Take 5 mg by mouth at bedtime as needed.       No current facility-administered medications for this visit.   Facility-Administered Medications Ordered in Other Visits  Medication Dose Route Frequency Provider Last Rate Last Dose  .  gadobenate dimeglumine (MULTIHANCE) injection 20 mL  20 mL Intravenous Once PRN Medication Radiologist, MD   20 mL at 03/31/11 0938     No Known Allergies  REVIEW OF SYSTEMS: Skin:  No history of rash.  No history of abnormal moles. Infection:  No history of hepatitis or HIV.  No history of MRSA. Neurologic:  No history of stroke.  No history of seizure.  No history of headaches. Cardiac:  Hypertension, though she is only on diuretics. Pulmonary:  Does not smoke cigarettes.  No asthma or bronchitis.  No OSA/CPAP.  Endocrine:  No diabetes. No thyroid disease. Gastrointestinal:  No history of stomach disease.  No history of liver disease.  No history of gall bladder disease.  No history of pancreas  disease.  No history of colon disease. Urologic:  No history of kidney stones.  No history of bladder infections. Musculoskeletal:  HIstory of back pain/osteroarthritis.  This is better since her weight loss surgery. Hematologic:  No bleeding disorder.  No history of anemia.  Not anticoagulated. Psycho-social:  The patient is oriented.   The patient has no obvious psychologic or social impairment to understanding our conversation and plan.  SOCIAL and FAMILY HISTORY: Separated. Blossom Hoops, is with her. She works in Clinical biochemist at [employer].  PHYSICAL EXAM: BP 121/64  Pulse 72  Temp(Src) 98.4 F (36.9 C) (Temporal)  Resp 16  Ht 5\' 7"  (1.702 m)  Wt 213 lb 9.6 oz (96.888 kg)  BMI 33.45 kg/m2  General: WN WF who is alert and generally healthy appearing.  HEENT: Normal. Pupils equal. Good dentition. Neck: Supple. No mass.  No thyroid mass.  Carotid pulse okay with no bruit. Lymph Nodes:  No supraclavicular or cervical or axillary nodes. Lungs: Clear to auscultation and symmetric breath sounds. Heart:  RRR. No murmur or rub. Breasts:  Left: Bruise at 12 o'clock.  Lumpy, but no mass of concern.  Right:  Unremarkable.  Abdomen: Soft. No mass. No tenderness.  Lap band port in RUQ.  Somewhat more prominent than normal. Rectal: Not done. Extremities:  Good strength and ROM  in upper and lower extremities. Neurologic:  Grossly intact to motor and sensory function. Psychiatric: Has normal mood and affect. Behavior is normal.   DATA REVIEWED: Path and radiology reprorts. I spent about 1 hour with the patient.  [name] Kin, MD,  [facility] [address] [place]  [address]   [place], [place]    [address] Phone:  [phone] FAX:  [phone]

## 2011-03-31 NOTE — Patient Instructions (Signed)
1.  To make appointment with medical oncology, radiation oncology, and genetic counseling.

## 2011-04-04 ENCOUNTER — Telehealth: Payer: Self-pay | Admitting: *Deleted

## 2011-04-04 ENCOUNTER — Telehealth (INDEPENDENT_AMBULATORY_CARE_PROVIDER_SITE_OTHER): Payer: Self-pay

## 2011-04-04 NOTE — Telephone Encounter (Signed)
I received a call from [place] at the [facility] about the patient's referrals.  She will see Dr Darnelle Catalan 1/22 at 1pm and Dr Basilio Cairo 1/23 0900am.

## 2011-04-04 NOTE — Telephone Encounter (Signed)
Confirmed 04/05/11 appt for Dr. Darnelle Catalan & 04/06/11 appt for Dr. Basilio Cairo w/ pt.  Unable to mail before letter - gave verbal.  Called [facility] & left message.

## 2011-04-04 NOTE — Telephone Encounter (Signed)
Confirmed 04/18/11 genetics appt w/ pt. 

## 2011-04-05 ENCOUNTER — Encounter: Payer: Self-pay | Admitting: *Deleted

## 2011-04-05 ENCOUNTER — Ambulatory Visit: Payer: BC Managed Care – PPO

## 2011-04-05 ENCOUNTER — Other Ambulatory Visit (HOSPITAL_BASED_OUTPATIENT_CLINIC_OR_DEPARTMENT_OTHER): Payer: BC Managed Care – PPO | Admitting: Lab

## 2011-04-05 ENCOUNTER — Ambulatory Visit (HOSPITAL_BASED_OUTPATIENT_CLINIC_OR_DEPARTMENT_OTHER): Payer: BC Managed Care – PPO | Admitting: Oncology

## 2011-04-05 ENCOUNTER — Other Ambulatory Visit: Payer: Self-pay | Admitting: *Deleted

## 2011-04-05 VITALS — BP 133/77 | HR 79 | Temp 98.7°F | Ht 66.0 in | Wt 217.6 lb

## 2011-04-05 DIAGNOSIS — C50419 Malignant neoplasm of upper-outer quadrant of unspecified female breast: Secondary | ICD-10-CM

## 2011-04-05 DIAGNOSIS — C50412 Malignant neoplasm of upper-outer quadrant of left female breast: Secondary | ICD-10-CM | POA: Insufficient documentation

## 2011-04-05 LAB — CBC WITH DIFFERENTIAL/PLATELET
Basophils Absolute: 0 10*3/uL (ref 0.0–0.1)
EOS%: 0.6 % (ref 0.0–7.0)
HCT: 35 % (ref 34.8–46.6)
HGB: 11.2 g/dL — ABNORMAL LOW (ref 11.6–15.9)
MCH: 26.1 pg (ref 25.1–34.0)
NEUT%: 64.8 % (ref 38.4–76.8)
WBC: 8.3 10*3/uL (ref 3.9–10.3)
lymph#: 2.3 10*3/uL (ref 0.9–3.3)

## 2011-04-05 LAB — COMPREHENSIVE METABOLIC PANEL
AST: 20 U/L (ref 0–37)
BUN: 11 mg/dL (ref 6–23)
CO2: 27 mEq/L (ref 19–32)
Calcium: 9.4 mg/dL (ref 8.4–10.5)
Chloride: 102 mEq/L (ref 96–112)
Creatinine, Ser: 0.83 mg/dL (ref 0.50–1.10)
Glucose, Bld: 94 mg/dL (ref 70–99)

## 2011-04-05 NOTE — Progress Notes (Signed)
ID: [name] [name]  DOB: [birth year]  MR#: [id number]  CSN#: [id number]  HPI: [name] [name] is a 46 year old [place] woman who underwent a yearly screening mammography in 02/11/2011. This showed heterogeneously dense tissue and a possible mass in the left breast. Additional views 03/21/2011 showed an area of amorphous calcifications in the upper central aspect of the left breast, measuring up to 3.0 cm. There was no associated mass. Biopsy was performed the same day and showed ([id number]) invasive ductal breast cancer, grade 1-2, with ductal carcinoma in situ, grade 1-3. The invasive tumor was 57% estrogen receptor positive, 97% progesterone receptor positive, with an MIB-1 of 10% and no HER-2 amplification.  With this information the patient proceeded to bilateral breast MRIs 03/31/2011 this showed a round enhancing mass with irregular margins measuring 6 mm, with an associated biopsy clip. There was additional clumped and linear enhancement of compatible with DCIS the total area measuring 3.1 cm. There were no other suspicious masses in either breast and there was no axillary or internal mammary adenopathy noted  Past medical Hx: She had a right breast biopsy remotely, which was benign. She has mild degenerative disc disease involving the cervical and lumbar spines. She status post lap band surgery November of 2010, and has lost 92 pounds so far. She has a history of seasonal allergies, hypertension which is improving eyes she continues to lose weight, history of postpartum migraines, resolved, history of seizure, remote, and is status post C-section x2 and dilatation and curettage times one  Family Hx: The patient's father died at the age of 16 from sepsis in the setting of lymphoma. The patient's mother died at the age of 53 after a heart attack in the setting of multiple strokes. The patient had no brothers. She has 3 sisters. There is no history of breast or ovarian cancer in the family  GYN  History: Menarche age 21, first pregnancy to term age 51. She is GX P2. Most recent menstrual period was 03/21/2011. Her periods usually last for 5 days, only one of which is heavy.  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 Her husband then is an Art gallery manager. They are separated but not divorced. The patient has 65 and 70-year-old sons at home, which sheared custody. She understands that in the absence of a formal divorced her husband will have a hematocrit healthcare power of attorney in case of a disabling event. She says this is not a problem for her. She attends [employer] locally  ROS:  She was having no symptoms prior to her mammogram and currently a detailed review of systems is significant only for mild low back pain, for which she sees a [facility]. She's had no problems related to her biopsy and has no systemic symptoms. A detailed review of systems was otherwise noncontributory.  No Known Allergies  Current Outpatient Prescriptions  Medication Sig Dispense Refill  . diclofenac (VOLTAREN) 0.1 % ophthalmic solution Place 1 drop into both eyes 4 (four) times daily as needed.      . hydrochlorothiazide (MICROZIDE) 12.5 MG capsule       . Loratadine 10 MG CAPS Take by mouth daily.        . MULTIPLE VITAMIN PO Take 1 tablet by mouth.      . phenylephrine (SUDAFED PE) 10 MG TABS Take 10 mg by mouth every 4 (four) hours as needed.      . zolpidem (AMBIEN) 5 MG tablet Take 5 mg by  mouth at bedtime as needed.         Objective:  Filed Vitals:   04/05/11 1346  BP: 133/77  Pulse: 79  Temp: 98.7 F (37.1 C)    BMI: Body mass index is 35.12 kg/(m^2).   ECOG FS: 0  Physical Exam:   Sclerae unicteric  Oropharynx clear  No peripheral adenopathy  Lungs clear -- no rales or rhonchi  Heart regular rate and rhythm  Abdomen benign  MSK no focal spinal tenderness, no peripheral edema  Neuro nonfocal  Breast exam: Both breasts  are moderately lumpy. In the left breast I do not palpate a definite mass. There are no skin changes or nipple retraction.  Lab Results:      Chemistry      Component Value Date/Time   NA 138 01/26/2009 0830   K 4.3 01/26/2009 0830   CL 102 01/26/2009 0830   CO2 28 01/26/2009 0830   BUN 16 01/26/2009 0830   CREATININE 0.82 01/26/2009 0830      Component Value Date/Time   CALCIUM 9.2 01/26/2009 0830   ALKPHOS 18* 01/26/2009 0830   AST 19 01/26/2009 0830   ALT 21 01/26/2009 0830   BILITOT 0.4 01/26/2009 0830       Lab Results  Component Value Date   WBC 8.3 04/05/2011   HGB 11.2* 04/05/2011   HCT 35.0 04/05/2011   MCV 81.4 04/05/2011   PLT 311 04/05/2011   NEUTROABS 5.4 04/05/2011    Studies/Results:  Mr Breast Bilateral W Wo Contrast  03/31/2011  *RADIOLOGY REPORT*  Clinical Data: New diagnosis left sided breast cancer  BUN and creatinine were obtained on site at [facility] at [address] Results:  BUN 6 mg/dL,  Creatinine 0.8 mg/dL.  BILATERAL BREAST MRI WITH AND WITHOUT CONTRAST  Technique: Multiplanar, multisequence MR images of both breasts were obtained prior to and following the intravenous administration of 20ml of Multihan Three dimensional images were evaluated at the independent DynaCad workstation.  Comparison:  Multiple prior mammograms, most recently dated 03/21/2011.  Findings: Mild background parenchymal enhancement is seen.  A round, enhancing mass with irregular margins is imaged in the 12 o'clock position of the left breast, anteriorly measuring 0.4 x 0.5 x 0.6 cm with associated biopsy clip.  Additional clumped and linear enhancement is seen extending posteriorly from the mass measuring 3.1 (AP) x 0.8 x 0.9 cm compatible with DCIS.  No additional suspicious mass or enhancement is seen in either breast. No axillary or internal mammary adenopathy is seen.  IMPRESSION: Known malignancy, left breast as detailed above.  No axillary or internal mammary  adenopathy is seen.  No MRI specific evidence of malignancy, right breast.  THREE-DIMENSIONAL MR IMAGE RENDERING ON INDEPENDENT WORKSTATION:  Three-dimensional MR images were rendered by post-processing of the original MR data on an independent workstation.  The three- dimensional MR images were interpreted, and findings were reported in the accompanying complete MRI report for this study.  BI-RADS CATEGORY 6:  Known biopsy-proven malignancy - appropriate action should be taken.  Original Report Authenticated By: Hiram Gash, M.D.    Assessment: 46 year old [place] woman with an area of abnormal calcifications in the left breast measuring up to 3.1 cm, but MRI showing a 6 mm mass in the setting of non-masslike enhancement measuring 3.0 cm. Biopsy shows invasive and in situ ductal carcinoma. The invasive component is estrogen and progesterone receptor positive, HER-2 negative, with an MIB-1 of 10   Plan: If we knew  for fact that all 3 cm of calcifications in her left breast are invasive, I think neoadjuvant treatment would be a good idea. I am concerned however that we have a small area of invasive disease in the setting of more extensive DCIS. Obviously chemotherapy would not be indicated in that case.  I do not know way of resolving this issue other than proceeding directly to surgery. That was my recommendation to data Ms. Smith. Her case will be presented at the multidisciplinary breast cancer conference tomorrow, and I have sent a note to Dr. Ezzard Standing, her surgeon, letting him know where the discussion is at this point. If she does proceed to surgery, and lymph nodes are negative, I will probably obtain an Oncotype to help [place] make a decision regarding chemotherapy, since her tumors pattern of positivity suggests a luminal a type breast cancer, and does generally do not benefit from chemotherapy.  Tentatively I have made Ms. Katrinka Blazing a return appointment here in one month she knows we will move that  up or back on an as-needed basis. We spent the better part of her 1 hour visit today discussing these issues.  MAGRINAT,GUSTAV C 04/05/2011

## 2011-04-06 ENCOUNTER — Ambulatory Visit
Admission: RE | Admit: 2011-04-06 | Discharge: 2011-04-06 | Disposition: A | Discharge status: Home / Self Care | Payer: BC Managed Care – PPO | Source: Ambulatory Visit | Attending: Radiation Oncology | Admitting: Radiation Oncology

## 2011-04-06 ENCOUNTER — Telehealth (INDEPENDENT_AMBULATORY_CARE_PROVIDER_SITE_OTHER): Payer: Self-pay | Admitting: Surgery

## 2011-04-06 ENCOUNTER — Encounter: Payer: Self-pay | Admitting: Radiation Oncology

## 2011-04-06 ENCOUNTER — Other Ambulatory Visit (INDEPENDENT_AMBULATORY_CARE_PROVIDER_SITE_OTHER): Payer: Self-pay | Admitting: Surgery

## 2011-04-06 DIAGNOSIS — C50119 Malignant neoplasm of central portion of unspecified female breast: Secondary | ICD-10-CM

## 2011-04-06 DIAGNOSIS — C50912 Malignant neoplasm of unspecified site of left female breast: Secondary | ICD-10-CM

## 2011-04-06 DIAGNOSIS — R238 Other skin changes: Secondary | ICD-10-CM | POA: Insufficient documentation

## 2011-04-06 DIAGNOSIS — Z809 Family history of malignant neoplasm, unspecified: Secondary | ICD-10-CM | POA: Insufficient documentation

## 2011-04-06 DIAGNOSIS — Z79899 Other long term (current) drug therapy: Secondary | ICD-10-CM | POA: Insufficient documentation

## 2011-04-06 DIAGNOSIS — I10 Essential (primary) hypertension: Secondary | ICD-10-CM | POA: Insufficient documentation

## 2011-04-06 DIAGNOSIS — Z170 Estrogen receptor positive status [ER+]: Secondary | ICD-10-CM | POA: Insufficient documentation

## 2011-04-06 DIAGNOSIS — C50419 Malignant neoplasm of upper-outer quadrant of unspecified female breast: Secondary | ICD-10-CM

## 2011-04-06 DIAGNOSIS — Z510 Encounter for antineoplastic radiation therapy: Secondary | ICD-10-CM | POA: Insufficient documentation

## 2011-04-06 DIAGNOSIS — Z980 Intestinal bypass and anastomosis status: Secondary | ICD-10-CM | POA: Insufficient documentation

## 2011-04-06 DIAGNOSIS — C50919 Malignant neoplasm of unspecified site of unspecified female breast: Secondary | ICD-10-CM | POA: Insufficient documentation

## 2011-04-06 NOTE — Telephone Encounter (Signed)
Dr Ezzard Standing plans on calling the patient today.

## 2011-04-06 NOTE — Progress Notes (Signed)
[name] [name] today.  Accompanied by  Cameron Ali.  No voiced complaints.  To call [facility] to find out when she will have surgery.

## 2011-04-06 NOTE — Telephone Encounter (Signed)
Patient called, she 's already seen the oncologist, and she says that Dr. Ezzard Standing gave her a very small window to have her sx done, so she needs someone to call her and let her know what her next step should be, please call.

## 2011-04-06 NOTE — Progress Notes (Signed)
Please see the Nurse Progress Note in the MD Initial Consult Encounter for this patient. 

## 2011-04-06 NOTE — Progress Notes (Signed)
[facility] Radiation Oncology NEW PATIENT EVALUATION  Name: [name] [name] MRN: [id number]  Date: 04/06/2011  DOB: [birth year]  Status: outpatient   CC: Neldon Labella, MD, MD  Kandis Cocking, MD  Gus Magrinat MD   REFERRING PHYSICIAN: Kandis Cocking, MD   DIAGNOSIS: clinical T1bN0M0 Left Breast cancer, invasive ductal carcinoma, ER/PR positive HER-2/neu negative Ki-67 10% grade 1-2. There was DCIS in the specimen    HISTORY OF PRESENT ILLNESS:  [name] [name] is a 46 y.o. female who was found on screening mammogram on 02/11/2011 to have a possible mass in the left breast. She subsequently underwent Digital diagnostic left mammogram on 03/21/2011 which demonstrated calcifications in the upper central aspect of the left breast. The distribution of calcifications spans 3.0 x 2.3 cm.. The patient underwent biopsy of the left breast on 03/21/2011. Invasive ductal carcinoma was demonstrated which was ER/PR positive and HER-2/neu negative with a Ki-67 of 10%. The invasive disease is grade 1-2. This resulted DCIS with comedonecrosis in specimen, grade 1-3. I've also were viewed her MRI from 03/31/2011 which demonstrates a round enhancing mass with irregular margins at the 12:00 position of the left breast that measures 0.4 x 0.5 x 0.6 cm. Additionally there is enhancement extending posteriorly from the mass that measures 3.1 x 0.8 x 0.9 cm compatible with DCIS. There is no axillary or internal mammary adenopathy appreciated on this study. The right breast appears benign.  She has appreciated some postbiopsy hematoma. She is going to call Dr. Allene Pyo office today to schedule surgical consultation. She was discussed at our tumor board this morning. Because most of the enhancement on her MRI appears compatible with DCIS, she is not a good candidate for neoadjuvant chemotherapy. She will need to discuss with Dr. Ezzard Standing as to whether a lumpectomy is realistic with a good cosmetic outcome  or whether mastectomy would be preferable. She may undergo Oncotype testing, eventually, if her lymph nodes are negative. Eventually she will be a candidate for antiestrogen therapy under the care of Dr. Darnelle Catalan.   PREVIOUS RADIATION THERAPY: No   PAST MEDICAL HISTORY:  has a past medical history of Hypertension; Status post biopsy (03/21/11); and Arthritis.     PAST SURGICAL HISTORY:  Past Surgical History  Procedure Date  . Laparoscopic gastric banding 01/27/2009  . Cesarean section 1999, 2005  . Biopsy breast 08/07/09    Breast, Right Needle core Biopsy, UOQ - Pseudoangiomatous Stromal hyperplasia    GYN History: She is GxP2, she started menstruating at age 50 and her first pregnancy to term was at age 53. Her last menstrual period was 03/21/2011   FAMILY HISTORY: family history includes Cancer in her father and Stroke in her mother. there is no family history of ovarian or breast cancer.   SOCIAL HISTORY:  reports that she has never smoked. She has never used smokeless tobacco. She reports that she drinks alcohol. She reports that she does not use illicit drugs.   ALLERGIES: Review of patient's allergies indicates no known allergies.   MEDICATIONS:  Current Outpatient Prescriptions  Medication Sig Dispense Refill  . cetirizine (ZYRTEC) 10 MG tablet Take 10 mg by mouth daily.      Marland Kitchen ibuprofen (ADVIL,MOTRIN) 200 MG tablet Take 200 mg by mouth every 6 (six) hours as needed.      . diclofenac (VOLTAREN) 0.1 % ophthalmic solution Place 1 drop into both eyes 4 (four) times daily as needed.      . hydrochlorothiazide (MICROZIDE) 12.5 MG  capsule       . Loratadine 10 MG CAPS Take by mouth daily.        . MULTIPLE VITAMIN PO Take 1 tablet by mouth.      . phenylephrine (SUDAFED PE) 10 MG TABS Take 10 mg by mouth every 4 (four) hours as needed.      . zolpidem (AMBIEN) 5 MG tablet Take 5 mg by mouth at bedtime as needed.         REVIEW OF SYSTEMS:  Pertinent items are noted in  HPI. a complete 14 point review of systems is otherwise negative.    PHYSICAL EXAM:  weight is 215 lb 6.4 oz (97.705 kg). Her temperature is 98.2 F (36.8 C). Her blood pressure is 120/72 and her pulse is 69.   General: Alert and oriented, in no acute distress HEENT: Head is normocephalic. Pupils are equally round and reactive to light. Extraocular movements are intact. Oropharynx is clear. Neck: Neck is supple, no palpable cervical or supraclavicular lymphadenopathy. Heart: Regular in rate and rhythm with no murmurs, rubs, or gallops. Chest: Clear to auscultation bilaterally, with no rhonchi, wheezes, or rales. Abdomen: Soft, nontender, nondistended, with no rigidity or guarding. She has a port from her gastric banding surgery that is palpable in the abdomen Extremities: No cyanosis or edema. Lymphatics: No concerning lymphadenopathy. Skin: No concerning lesions. Musculoskeletal: symmetric strength and muscle tone throughout. Neurologic: Cranial nerves II through XII are grossly intact. No obvious focalities. Speech is fluent. Coordination is intact. Psychiatric: Judgment and insight are intact. Affect is appropriate. Breasts: Her left breast is notable for a palpable lesion consistent with post biopsy hematoma at the 12:00 position. Axillary regions bilaterally are benign to palpation. She has no palpable axillary lymph nodes.    LABORATORY DATA:  Lab Results  Component Value Date   WBC 8.3 04/05/2011   HGB 11.2* 04/05/2011   HCT 35.0 04/05/2011   MCV 81.4 04/05/2011   PLT 311 04/05/2011   CMP     Component Value Date/Time   NA 139 04/05/2011 1331   K 4.5 04/05/2011 1331   CL 102 04/05/2011 1331   CO2 27 04/05/2011 1331   GLUCOSE 94 04/05/2011 1331   BUN 11 04/05/2011 1331   CREATININE 0.83 04/05/2011 1331   CALCIUM 9.4 04/05/2011 1331   PROT 7.0 04/05/2011 1331   ALBUMIN 4.4 04/05/2011 1331   AST 20 04/05/2011 1331   ALT 17 04/05/2011 1331   ALKPHOS 17* 04/05/2011 1331   BILITOT 0.5  04/05/2011 1331   GFRNONAA >60 01/26/2009 0830   GFRAA  Value: >60        The eGFR has been calculated using the MDRD equation. This calculation has not been validated in all clinical situations. eGFR's persistently <60 mL/min signify possible Chronic Kidney Disease. 01/26/2009 0830      PATHOLOGY: As above   RADIOLOGY: As above    IMPRESSION/PLAN: This is a very pleasant 46 year old woman with clinical T1 BN0M0 invasive ductal carcinoma of the left breast. She has adjacent enhancement consistent with at least 3 cm of DCIS. She will be meeting with Dr. Ezzard Standing to discuss surgical options. It is still unclear whether she will need to have a mastectomy versus taking the approach of breast conservation.   I talked to her about different scenarios:   #1 if she ends up needing a mastectomy, the indications for postmastectomy radiation would be positive lymph node(s) and/or positive margins. In that case I would be happy to see  her back to discuss the delivery of approximately 6 weeks of radiation to the chest wall and regional lymph nodes   #2 she ends up having a lumpectomy and sentinel lymph node biopsy in the setting of breast conservation, she will need whole breast radiotherapy to decrease her risk of a local regional recurrence. I explained that radiation would decrease her risk of a recurrence in the breast by about two thirds. Whether she'll need chemotherapy will be determined later; she may undergo Oncotype testing if her lymph nodes are negative. I explained that if she needs chemotherapy this would follow surgery and take place before radiotherapy.   It was a pleasure meeting the patient today. We discussed the risks, benefits, and side effects of radiotherapy. She understands that radiotherapy would take place over approximately 6 weeks and that she would be in our department for about half an hour a day on weekdays with The weekends off from treatment. We discussed the potential side  effects of radiotherapy which include skin irritation and cosmetic changes of the breast as well as very rare damage to the lung and heart. We spoke about special techniques that we take to decrease the risk of side effects. For example, I explained that she may be a candidate for breath-hold technique to decrease the amount of heart that is radiated.   We did briefly speak about prognosis. The patient was somewhat surprised when I mentioned that breast cancer can affect patients' life expectancy. I reassured her that we will do everything we can to treat her disease aggressively so that she has the best possible chance of cure. She seemed reassured by this. I would not be surprised if more questions come up in the future regarding prognosis, which I will be happy to discuss with her further.  I gave the patient to contact information if she has any questions in the interim. If she needs adjuvant radiotherapy, I will be happy to participate in her care.

## 2011-04-06 NOTE — Progress Notes (Signed)
Encounter addended by: Delynn Flavin, RN on: 04/06/2011  4:31 PM<BR>     Documentation filed: Charges VN

## 2011-04-07 ENCOUNTER — Encounter: Payer: Self-pay | Admitting: *Deleted

## 2011-04-07 ENCOUNTER — Encounter: Payer: Self-pay | Admitting: Oncology

## 2011-04-07 NOTE — Progress Notes (Signed)
Mailed after appt letter to pt. 

## 2011-04-07 NOTE — Progress Notes (Signed)
Faxed fmla papers to [employer] attn: Keene Breath @ [phone] and put originals in registration desk.

## 2011-04-11 ENCOUNTER — Encounter: Payer: Self-pay | Admitting: *Deleted

## 2011-04-11 ENCOUNTER — Other Ambulatory Visit (INDEPENDENT_AMBULATORY_CARE_PROVIDER_SITE_OTHER): Payer: Self-pay | Admitting: Surgery

## 2011-04-11 DIAGNOSIS — C50912 Malignant neoplasm of unspecified site of left female breast: Secondary | ICD-10-CM

## 2011-04-11 NOTE — Progress Notes (Signed)
[facility] Psychosocial Distress Screening Clinical Social Work  Clinical Social Work was referred by distress screening protocol.  The patient scored a 7 on the Psychosocial Distress Thermometer which indicates moderate distress. Clinical Social Worker was unable to meet with pt in the exam room; therefore contacted pt at home to assess for distress and other psychosocial needs.  Pt stated she was doing well and had no urgent needs at this time.  CSW informed pt of the supportive services and support team at [facility].  CSW encouraged pt to call with questions or concerns.      Clinical Social Worker follow up needed: not at this time.  Tamala Julian, MSW, LCSW Clinical Social Worker [facility] [phone]

## 2011-04-12 ENCOUNTER — Ambulatory Visit: Payer: BC Managed Care – PPO

## 2011-04-12 ENCOUNTER — Ambulatory Visit: Payer: BC Managed Care – PPO | Admitting: Radiation Oncology

## 2011-04-12 ENCOUNTER — Telehealth: Payer: Self-pay | Admitting: *Deleted

## 2011-04-12 NOTE — Telephone Encounter (Signed)
Pt called stating that she does not want to have the genetics test completed.  Cancelled 2/4 appt per pts request.

## 2011-04-13 ENCOUNTER — Encounter (HOSPITAL_BASED_OUTPATIENT_CLINIC_OR_DEPARTMENT_OTHER): Payer: Self-pay

## 2011-04-13 MED ORDER — CHLORHEXIDINE GLUCONATE 4 % EX LIQD: 1.0000 "application " | Freq: Once | CUTANEOUS | Status: DC

## 2011-04-13 MED ORDER — CEFAZOLIN SODIUM 1-5 GM-% IV SOLN: 1.0000 g | INTRAVENOUS | Status: DC

## 2011-04-13 NOTE — Progress Notes (Addendum)
Patient with history of lap band in 2010 with subsequent weight loss-patient currently states she is on HCTZ for fluid retention and that her BP issues have resolved as per her PMD. As per Dr. Jean Rosenthal, pt. to come in for BMET prior to surgery. As per Dr. Jean Rosenthal, no EKG needed .Pt. advised to bring all home medications DOS.Pt. Advised to come to surgery center immedietely after done at breast center the DOS.

## 2011-04-18 ENCOUNTER — Encounter (HOSPITAL_BASED_OUTPATIENT_CLINIC_OR_DEPARTMENT_OTHER)
Admission: RE | Admit: 2011-04-18 | Discharge: 2011-04-18 | Disposition: A | Discharge status: Home / Self Care | Payer: BC Managed Care – PPO | Source: Ambulatory Visit | Attending: Surgery | Admitting: Surgery

## 2011-04-18 LAB — BASIC METABOLIC PANEL
Calcium: 9.3 mg/dL (ref 8.4–10.5)
GFR calc Af Amer: >90 mL/min (ref >90)
GFR calc non Af Amer: 85 mL/min — ABNORMAL LOW (ref >90)
Sodium: 138 mEq/L (ref 135–145)

## 2011-04-19 ENCOUNTER — Other Ambulatory Visit (HOSPITAL_COMMUNITY): Payer: BC Managed Care – PPO

## 2011-04-19 ENCOUNTER — Ambulatory Visit (HOSPITAL_COMMUNITY)
Admission: RE | Admit: 2011-04-19 | Discharge: 2011-04-19 | Disposition: A | Discharge status: Home / Self Care | Payer: BC Managed Care – PPO | Source: Ambulatory Visit | Attending: Surgery | Admitting: Surgery

## 2011-04-19 ENCOUNTER — Encounter (HOSPITAL_BASED_OUTPATIENT_CLINIC_OR_DEPARTMENT_OTHER): Payer: Self-pay | Admitting: Anesthesiology

## 2011-04-19 ENCOUNTER — Ambulatory Visit
Admission: RE | Admit: 2011-04-19 | Discharge: 2011-04-19 | Disposition: A | Discharge status: Home / Self Care | Payer: BC Managed Care – PPO | Source: Ambulatory Visit | Attending: Surgery | Admitting: Surgery

## 2011-04-19 ENCOUNTER — Encounter (HOSPITAL_BASED_OUTPATIENT_CLINIC_OR_DEPARTMENT_OTHER): Payer: Self-pay

## 2011-04-19 ENCOUNTER — Ambulatory Visit (HOSPITAL_BASED_OUTPATIENT_CLINIC_OR_DEPARTMENT_OTHER)
Admission: RE | Admit: 2011-04-19 | Discharge: 2011-04-19 | Disposition: A | Discharge status: Home / Self Care | Payer: BC Managed Care – PPO | Source: Ambulatory Visit | Attending: Surgery | Admitting: Surgery

## 2011-04-19 ENCOUNTER — Encounter (HOSPITAL_BASED_OUTPATIENT_CLINIC_OR_DEPARTMENT_OTHER)
Admission: RE | Disposition: A | Discharge status: Home / Self Care | Payer: Self-pay | Source: Ambulatory Visit | Attending: Surgery

## 2011-04-19 ENCOUNTER — Other Ambulatory Visit (INDEPENDENT_AMBULATORY_CARE_PROVIDER_SITE_OTHER): Payer: Self-pay | Admitting: Surgery

## 2011-04-19 DIAGNOSIS — C50912 Malignant neoplasm of unspecified site of left female breast: Secondary | ICD-10-CM

## 2011-04-19 DIAGNOSIS — C50919 Malignant neoplasm of unspecified site of unspecified female breast: Secondary | ICD-10-CM | POA: Insufficient documentation

## 2011-04-19 DIAGNOSIS — Z01812 Encounter for preprocedural laboratory examination: Secondary | ICD-10-CM | POA: Insufficient documentation

## 2011-04-19 DIAGNOSIS — I10 Essential (primary) hypertension: Secondary | ICD-10-CM | POA: Insufficient documentation

## 2011-04-19 DIAGNOSIS — C50119 Malignant neoplasm of central portion of unspecified female breast: Secondary | ICD-10-CM

## 2011-04-19 DIAGNOSIS — C50419 Malignant neoplasm of upper-outer quadrant of unspecified female breast: Secondary | ICD-10-CM

## 2011-04-19 HISTORY — PX: BREAST LUMPECTOMY: SHX2

## 2011-04-19 HISTORY — DX: Anemia, unspecified: D64.9

## 2011-04-19 HISTORY — DX: Malignant (primary) neoplasm, unspecified: C80.1

## 2011-04-19 SURGERY — BREAST LUMPECTOMY WITH NEEDLE LOCALIZATION AND AXILLARY SENTINEL LYMPH NODE BX
Anesthesia: General | Site: Breast | Laterality: Left | Wound class: Clean

## 2011-04-19 MED ORDER — BUPIVACAINE HCL (PF) 0.25 % IJ SOLN
INTRAMUSCULAR | Status: DC | PRN
  Administered 2011-04-19: 30 mL

## 2011-04-19 MED ORDER — MEPERIDINE HCL 25 MG/ML IJ SOLN: 6.2500 mg | INTRAMUSCULAR | Status: DC | PRN

## 2011-04-19 MED ORDER — HYDROMORPHONE HCL PF 1 MG/ML IJ SOLN
0.2500 mg | INTRAMUSCULAR | Status: DC | PRN
  Administered 2011-04-19 (×4): 0.5 mg via INTRAVENOUS

## 2011-04-19 MED ORDER — HYDROCODONE-ACETAMINOPHEN 5-325 MG PO TABS: 1.0000 | ORAL_TABLET | Freq: Four times a day (QID) | ORAL | Status: AC | PRN

## 2011-04-19 MED ORDER — PROPOFOL 10 MG/ML IV EMUL: INTRAVENOUS | Status: DC | PRN

## 2011-04-19 MED ORDER — LACTATED RINGERS IV SOLN: INTRAVENOUS | Status: DC

## 2011-04-19 MED ORDER — MIDAZOLAM HCL 2 MG/2ML IJ SOLN
2.0000 mg | INTRAMUSCULAR | Status: DC | PRN
  Administered 2011-04-19: 2 mg via INTRAVENOUS

## 2011-04-19 MED ORDER — CEFAZOLIN SODIUM-DEXTROSE 2-3 GM-% IV SOLR
2.0000 g | INTRAVENOUS | Status: AC
  Administered 2011-04-19: 2 g via INTRAVENOUS

## 2011-04-19 MED ORDER — MIDAZOLAM HCL 5 MG/5ML IJ SOLN
INTRAMUSCULAR | Status: DC | PRN
  Administered 2011-04-19: 2 mg via INTRAVENOUS

## 2011-04-19 MED ORDER — PROMETHAZINE HCL 25 MG/ML IJ SOLN: 6.2500 mg | INTRAMUSCULAR | Status: DC | PRN

## 2011-04-19 MED ORDER — DROPERIDOL 2.5 MG/ML IJ SOLN
INTRAMUSCULAR | Status: DC | PRN
  Administered 2011-04-19: 0.625 mg via INTRAVENOUS

## 2011-04-19 MED ORDER — DIPHENHYDRAMINE HCL 50 MG/ML IJ SOLN
6.2500 mg | Freq: Once | INTRAMUSCULAR | Status: AC
  Administered 2011-04-19: 12.5 mg via INTRAVENOUS

## 2011-04-19 MED ORDER — LIDOCAINE HCL (CARDIAC) 20 MG/ML IV SOLN
INTRAVENOUS | Status: DC | PRN
  Administered 2011-04-19: 60 mg via INTRAVENOUS

## 2011-04-19 MED ORDER — FENTANYL CITRATE 0.05 MG/ML IJ SOLN
100.0000 ug | INTRAMUSCULAR | Status: DC | PRN
  Administered 2011-04-19: 100 ug via INTRAVENOUS

## 2011-04-19 MED ORDER — TECHNETIUM TC 99M SULFUR COLLOID FILTERED
1.0000 | Freq: Once | INTRAVENOUS | Status: AC | PRN
  Administered 2011-04-19: 1 via INTRADERMAL

## 2011-04-19 MED ORDER — PROPOFOL 10 MG/ML IV EMUL
INTRAVENOUS | Status: DC | PRN
  Administered 2011-04-19: 200 mg via INTRAVENOUS

## 2011-04-19 MED ORDER — ACETAMINOPHEN 10 MG/ML IV SOLN
1000.0000 mg | Freq: Once | INTRAVENOUS | Status: AC
  Administered 2011-04-19: 1000 mg via INTRAVENOUS

## 2011-04-19 MED ORDER — FENTANYL CITRATE 0.05 MG/ML IJ SOLN
INTRAMUSCULAR | Status: DC | PRN
  Administered 2011-04-19: 100 ug via INTRAVENOUS

## 2011-04-19 MED ORDER — LACTATED RINGERS IV SOLN
INTRAVENOUS | Status: DC | PRN
  Administered 2011-04-19 (×2): via INTRAVENOUS

## 2011-04-19 MED ORDER — ONDANSETRON HCL 4 MG/2ML IJ SOLN
INTRAMUSCULAR | Status: DC | PRN
  Administered 2011-04-19: 4 mg via INTRAVENOUS

## 2011-04-19 MED ORDER — SODIUM CHLORIDE 0.9 % IJ SOLN
INTRAMUSCULAR | Status: DC | PRN
  Administered 2011-04-19: 15:00:00 via INTRAMUSCULAR

## 2011-04-19 MED ORDER — DEXAMETHASONE SODIUM PHOSPHATE 4 MG/ML IJ SOLN
INTRAMUSCULAR | Status: DC | PRN
  Administered 2011-04-19: 10 mg via INTRAVENOUS

## 2011-04-19 SURGICAL SUPPLY — 67 items
ADH SKN CLS APL DERMABOND .7 (GAUZE/BANDAGES/DRESSINGS) ×1
APPLIER CLIP 11 MED OPEN (CLIP)
APR CLP MED 11 20 MLT OPN (CLIP)
BANDAGE ELASTIC 6 VELCRO ST LF (GAUZE/BANDAGES/DRESSINGS) ×1 IMPLANT
BINDER BREAST LRG (GAUZE/BANDAGES/DRESSINGS) IMPLANT
BINDER BREAST MEDIUM (GAUZE/BANDAGES/DRESSINGS) IMPLANT
BINDER BREAST XLRG (GAUZE/BANDAGES/DRESSINGS) ×1 IMPLANT
BINDER BREAST XXLRG (GAUZE/BANDAGES/DRESSINGS) IMPLANT
BLADE HEX COATED 2.75 (ELECTRODE) ×2 IMPLANT
BLADE SURG 10 STRL SS (BLADE) ×2 IMPLANT
BLADE SURG 15 STRL LF DISP TIS (BLADE) ×1 IMPLANT
BLADE SURG 15 STRL SS (BLADE) ×2
CANISTER SUCTION 1200CC (MISCELLANEOUS) ×2 IMPLANT
CHLORAPREP W/TINT 26ML (MISCELLANEOUS) ×2 IMPLANT
CLIP APPLIE 11 MED OPEN (CLIP) IMPLANT
CLIP TI WIDE RED SMALL 6 (CLIP) ×1 IMPLANT
CLOTH BEACON ORANGE TIMEOUT ST (SAFETY) ×2 IMPLANT
COVER MAYO STAND STRL (DRAPES) ×2 IMPLANT
COVER PROBE W GEL 5X96 (DRAPES) ×2 IMPLANT
COVER TABLE BACK 60X90 (DRAPES) ×2 IMPLANT
DECANTER SPIKE VIAL GLASS SM (MISCELLANEOUS) IMPLANT
DERMABOND ADVANCED (GAUZE/BANDAGES/DRESSINGS) ×1
DERMABOND ADVANCED .7 DNX12 (GAUZE/BANDAGES/DRESSINGS) ×1 IMPLANT
DEVICE DUBIN W/COMP PLATE 8390 (MISCELLANEOUS) ×2 IMPLANT
DRAIN CHANNEL 19F RND (DRAIN) IMPLANT
DRAIN HEMOVAC 1/8 X 5 (WOUND CARE) IMPLANT
DRAPE LAPAROSCOPIC ABDOMINAL (DRAPES) ×2 IMPLANT
DRAPE UTILITY XL STRL (DRAPES) ×2 IMPLANT
ELECT COATED BLADE 2.86 ST (ELECTRODE) ×2 IMPLANT
ELECT REM PT RETURN 9FT ADLT (ELECTROSURGICAL) ×2
ELECTRODE REM PT RTRN 9FT ADLT (ELECTROSURGICAL) ×1 IMPLANT
EVACUATOR SILICONE 100CC (DRAIN) IMPLANT
GAUZE SPONGE 4X4 12PLY STRL LF (GAUZE/BANDAGES/DRESSINGS) IMPLANT
GAUZE SPONGE 4X4 16PLY XRAY LF (GAUZE/BANDAGES/DRESSINGS) IMPLANT
GLOVE BIO SURGEON STRL SZ7 (GLOVE) ×1 IMPLANT
GLOVE BIOGEL PI IND STRL 7.0 (GLOVE) IMPLANT
GLOVE BIOGEL PI INDICATOR 7.0 (GLOVE) ×1
GLOVE SURG SIGNA 7.5 PF LTX (GLOVE) ×3 IMPLANT
GOWN PREVENTION PLUS XLARGE (GOWN DISPOSABLE) ×3 IMPLANT
GOWN PREVENTION PLUS XXLARGE (GOWN DISPOSABLE) ×2 IMPLANT
KIT MARKER MARGIN INK (KITS) ×1 IMPLANT
NDL HYPO 25X1 1.5 SAFETY (NEEDLE) ×2 IMPLANT
NDL SAFETY ECLIPSE 18X1.5 (NEEDLE) ×1 IMPLANT
NEEDLE HYPO 18GX1.5 SHARP (NEEDLE) ×2
NEEDLE HYPO 25X1 1.5 SAFETY (NEEDLE) ×4 IMPLANT
NS IRRIG 1000ML POUR BTL (IV SOLUTION) ×2 IMPLANT
PACK BASIN DAY SURGERY FS (CUSTOM PROCEDURE TRAY) ×2 IMPLANT
PAD ALCOHOL SWAB (MISCELLANEOUS) ×2 IMPLANT
PENCIL BUTTON HOLSTER BLD 10FT (ELECTRODE) ×3 IMPLANT
PIN SAFETY STERILE (MISCELLANEOUS) IMPLANT
SHEET MEDIUM DRAPE 40X70 STRL (DRAPES) ×2 IMPLANT
SLEEVE SCD COMPRESS KNEE MED (MISCELLANEOUS) ×2 IMPLANT
SPONGE GAUZE 4X4 12PLY (GAUZE/BANDAGES/DRESSINGS) ×1 IMPLANT
SPONGE LAP 18X18 X RAY DECT (DISPOSABLE) ×2 IMPLANT
STAPLER VISISTAT 35W (STAPLE) ×1 IMPLANT
SUT ETHILON 2 0 FS 18 (SUTURE) IMPLANT
SUT MON AB 5-0 PS2 18 (SUTURE) ×2 IMPLANT
SUT SILK 3 0 TIES 17X18 (SUTURE)
SUT SILK 3-0 18XBRD TIE BLK (SUTURE) IMPLANT
SUT VICRYL 3-0 CR8 SH (SUTURE) ×3 IMPLANT
SYR CONTROL 10ML LL (SYRINGE) ×4 IMPLANT
TAPE CLOTH SURG 4X10 WHT LF (GAUZE/BANDAGES/DRESSINGS) ×1 IMPLANT
TOWEL OR 17X24 6PK STRL BLUE (TOWEL DISPOSABLE) ×4 IMPLANT
TOWEL OR NON WOVEN STRL DISP B (DISPOSABLE) ×2 IMPLANT
TUBE CONNECTING 20X1/4 (TUBING) ×2 IMPLANT
WATER STERILE IRR 1000ML POUR (IV SOLUTION) ×1 IMPLANT
YANKAUER SUCT BULB TIP NO VENT (SUCTIONS) ×2 IMPLANT

## 2011-04-19 NOTE — Anesthesia Preprocedure Evaluation (Signed)
Anesthesia Evaluation  Patient identified by MRN, date of birth, ID band Patient awake    Reviewed: Allergy & Precautions, H&P , NPO status , Patient's Chart, lab work & pertinent test results  Airway Mallampati: II TM Distance: >3 FB Neck ROM: Full    Dental No notable dental hx. (+) Teeth Intact   Pulmonary neg pulmonary ROS,  clear to auscultation  Pulmonary exam normal       Cardiovascular hypertension, On Medications neg cardio ROS Regular Normal    Neuro/Psych Negative Neurological ROS  Negative Psych ROS   GI/Hepatic negative GI ROS, Neg liver ROS,   Endo/Other  Negative Endocrine ROS  Renal/GU negative Renal ROS  Genitourinary negative   Musculoskeletal   Abdominal   Peds  Hematology negative hematology ROS (+)   Anesthesia Other Findings   Reproductive/Obstetrics negative OB ROS                           Anesthesia Physical Anesthesia Plan  ASA: II  Anesthesia Plan: General   Post-op Pain Management:    Induction: Intravenous  Airway Management Planned: LMA  Additional Equipment:   Intra-op Plan:   Post-operative Plan: Extubation in OR  Informed Consent: I have reviewed the patients History and Physical, chart, labs and discussed the procedure including the risks, benefits and alternatives for the proposed anesthesia with the patient or authorized representative who has indicated his/her understanding and acceptance.     Plan Discussed with: CRNA  Anesthesia Plan Comments:         Anesthesia Quick Evaluation

## 2011-04-19 NOTE — Anesthesia Postprocedure Evaluation (Signed)
  Anesthesia Post-op Note  Patient: [name] [name]  Procedure(s) Performed:  BREAST LUMPECTOMY WITH NEEDLE LOCALIZATION AND AXILLARY SENTINEL LYMPH NODE BX  Patient Location: PACU  Anesthesia Type: General  Level of Consciousness: awake  Airway and Oxygen Therapy: Patient Spontanous Breathing  Post-op Pain: mild  Post-op Assessment: Post-op Vital signs reviewed, Patient's Cardiovascular Status Stable, Respiratory Function Stable, Patent Airway and No signs of Nausea or vomiting  Post-op Vital Signs: Reviewed and stable  Complications: No apparent anesthesia complications

## 2011-04-19 NOTE — Brief Op Note (Signed)
04/19/2011  3:52 PM  PATIENT:  [name] [name], 46 y.o., female, MRN: [id number]  PREOP DIAGNOSIS:  left breast cancer   POSTOP DIAGNOSIS:   Left breast cancer, clinical T2, N0.  PROCEDURE:   Procedure(s): Left BREAST LUMPECTOMY WITH NEEDLE LOCALIZATION (2 wires) AND left AXILLARY SENTINEL LYMPH NODE BX, injection of 40% methylene blue (approx. 1 cc)  SURGEON:   Ovidio Kin, M.D.  ASSISTANT:   none  ANESTHESIA:   general  W. Autumn Patty, MD - Anesthesiologist Sharyne Richters, CRNA - CRNA Gladys Damme, CRNA - CRNA  General  EBL:  <100  ml  BLOOD ADMINISTERED: none  DRAINS: none   LOCAL MEDICATIONS USED:   30 cc 1/4% marcaine  SPECIMEN:   Left breast lumpectomy and left axillary sentinel lymph node  COUNTS CORRECT:  YES  INDICATIONS FOR PROCEDURE:  [name] is a 46 y.o. (DOB: 26-Sep-1965) white female whose primary care physician is Neldon Labella, MD, MD and comes for left breast lumpectomy and left axillary sentinel lymph node biopsy.   The indications and risks of the surgery were explained to the patient.  The risks include, but are not limited to, infection, bleeding, and nerve injury.  Note dictated to:   [id number]  DN  04/19/2011

## 2011-04-19 NOTE — Transfer of Care (Signed)
Immediate Anesthesia Transfer of Care Note  Patient: [name] [name]  Procedure(s) Performed:  BREAST LUMPECTOMY WITH NEEDLE LOCALIZATION AND AXILLARY SENTINEL LYMPH NODE BX  Patient Location: PACU  Anesthesia Type: General  Level of Consciousness: awake and alert   Airway & Oxygen Therapy: Patient Spontanous Breathing and Patient connected to face mask oxygen  Post-op Assessment: Report given to PACU RN and Post -op Vital signs reviewed and stable  Post vital signs: Reviewed and stable  Complications: No apparent anesthesia complications

## 2011-04-19 NOTE — H&P (View-Only) (Signed)
 Re:   [name] [name] DOB:   04/16/1965 MRN:   [id number]  ASSESSMENT AND PLAN: 1.  Left breast cancer, 12 o'clock (central in breast)  MRI - 0.6 cm area with 3.1 cm area extending posteriorly from mass.  Technically a T2 tumor.  Will get medical oncology consult for neo-adjuvant therapy.  Also will see rad oncology and genetic counseling.  Await those consults.  The patient is to see Drs. Marginat and Squires. [Spoke with Dr. Magrinat.  There is an uncertainty about how large the cancer is.  Since size could impact treatment, I will do the lumpectomy first.  I will also do a left axillary SLNBx.  I called Ms. [name] and explained this to her.  I spent 20 minutes on the phone with the patient.  I explained the risks of surgery, including, but not limited to, infection, bleeding, nerve injury, and the possible need for more surgery.  She understands the concept of margins.  And we discussed the SLNBx.  We will schedule left needle loc lumpectomy and left axillary SLNBx for the patient.  She does not need to see me in the office before surgery, unless she has questions.  DN  04/05/2010]  2.  Lap band - 01/27/2009.  Initial weight 273, BMI 42.9.  She is happy with weight loss.  Her port is prominent.  Her way to loose weight is lap band, exercise, and Medifast. 3.  Hypertension. 4.  Osteoarthritis of back.  Better with weight loss.  Chief Complaint  Patient presents with  . Breast Cancer   REFERRING PHYSICIAN: MILLER,LISA LYNN, MD, MD  HISTORY OF PRESENT ILLNESS: [name] [name] is a 46 y.o. (DOB: 02/23/1966)  white female whose primary care physician is MILLER,LISA LYNN, MD, MD and comes to me today for left breast cancer.  She has done very well with her lap band, though she says the port is pouching out some.  She had a right breast biopsy in 08/07/2009, which showed pseudoangiomatous stromal hyperplasia.   She is not on estrogen.  She is taking Medifast (which has soy based products).  She  has no family history of breast cancer.  She has three sister.  She is still having regular periods.  She had a screening mammogram 03/21/2011 that showed a 3 x 2.3 cm area of calcifications in the upper central left breast.  She underwent a core biopsy which showed an invasive ductal carcinoma.  Path (side, TNM): Invasive ductal ca, left T2, N0 Surgery: pending   Date: - Size of tumor: 3.1 cm by MRI Nodes: -/- ER: 57% PR: 97% Ki67: 10%  HER2Neu: -=Negative.  Medical Oncologist: -  Radiation Oncologist: -   No past medical history on file.    Past Surgical History  Procedure Date  . Laparoscopic gastric banding 01/2009  . Cesarean section 1999, 2005      Current Outpatient Prescriptions  Medication Sig Dispense Refill  . hydrochlorothiazide (MICROZIDE) 12.5 MG capsule       . Loratadine 10 MG CAPS Take by mouth daily.        . phenylephrine (SUDAFED PE) 10 MG TABS Take 10 mg by mouth every 4 (four) hours as needed.      . zolpidem (AMBIEN) 5 MG tablet Take 5 mg by mouth at bedtime as needed.       No current facility-administered medications for this visit.   Facility-Administered Medications Ordered in Other Visits  Medication Dose Route Frequency Provider Last Rate Last Dose  .   gadobenate dimeglumine (MULTIHANCE) injection 20 mL  20 mL Intravenous Once PRN Medication Radiologist, MD   20 mL at 03/31/11 0938     No Known Allergies  REVIEW OF SYSTEMS: Skin:  No history of rash.  No history of abnormal moles. Infection:  No history of hepatitis or HIV.  No history of MRSA. Neurologic:  No history of stroke.  No history of seizure.  No history of headaches. Cardiac:  Hypertension, though she is only on diuretics. Pulmonary:  Does not smoke cigarettes.  No asthma or bronchitis.  No OSA/CPAP.  Endocrine:  No diabetes. No thyroid disease. Gastrointestinal:  No history of stomach disease.  No history of liver disease.  No history of gall bladder disease.  No history of pancreas  disease.  No history of colon disease. Urologic:  No history of kidney stones.  No history of bladder infections. Musculoskeletal:  HIstory of back pain/osteroarthritis.  This is better since her weight loss surgery. Hematologic:  No bleeding disorder.  No history of anemia.  Not anticoagulated. Psycho-social:  The patient is oriented.   The patient has no obvious psychologic or social impairment to understanding our conversation and plan.  SOCIAL and FAMILY HISTORY: Separated. Friend, Nancy Williams, is with her. She works in customer service at [employer].  PHYSICAL EXAM: BP 121/64  Pulse 72  Temp(Src) 98.4 F (36.9 C) (Temporal)  Resp 16  Ht 5' 7" (1.702 m)  Wt 213 lb 9.6 oz (96.888 kg)  BMI 33.45 kg/m2  General: WN WF who is alert and generally healthy appearing.  HEENT: Normal. Pupils equal. Good dentition. Neck: Supple. No mass.  No thyroid mass.  Carotid pulse okay with no bruit. Lymph Nodes:  No supraclavicular or cervical or axillary nodes. Lungs: Clear to auscultation and symmetric breath sounds. Heart:  RRR. No murmur or rub. Breasts:  Left: Bruise at 12 o'clock.  Lumpy, but no mass of concern.  Right:  Unremarkable.  Abdomen: Soft. No mass. No tenderness.  Lap band port in RUQ.  Somewhat more prominent than normal. Rectal: Not done. Extremities:  Good strength and ROM  in upper and lower extremities. Neurologic:  Grossly intact to motor and sensory function. Psychiatric: Has normal mood and affect. Behavior is normal.   DATA REVIEWED: Path and radiology reprorts. I spent about 1 hour with the patient.  Blayze Haen, MD,  FACS [facility] [address]  [address]   [place], [place]    [address] Phone:  [phone] FAX:  [phone]  

## 2011-04-19 NOTE — Interval H&P Note (Signed)
History and Physical Interval Note:  04/19/2011 1:56 PM  [name] [name]  has presented today for surgery, with the diagnosis of left breast cancer   The various methods of treatment have been discussed with the patient and family.   After consideration of risks, benefits and other options for treatment, the patient has consented to  Procedure(s): Left BREAST LUMPECTOMY WITH NEEDLE LOCALIZATION AND AXILLARY SENTINEL LYMPH NODE BX as a surgical intervention .    The patients' history has been reviewed, patient examined, no change in status, stable for surgery.  I have reviewed the patients' chart and labs.  Questions were answered to the patient's satisfaction.     [name] H

## 2011-04-19 NOTE — Op Note (Signed)
NAMEMarland Kitchen  [name] [name], [name] [name] NO.:  [id number]  MEDICAL RECORD NO.:  [id number]  LOCATION:  [facility]                          FACILITY:  [facility]  PHYSICIAN:  Sandria Bales. Ezzard Standing, M.D.  DATE OF BIRTH:  [birth year]  DATE OF PROCEDURE:  04/19/2011                              OPERATIVE REPORT  PREOPERATIVE DIAGNOSIS:  Left breast cancer at 12 o'clock position, 3.1 cm per MRI (T2 N0).  POSTOPERATIVE DIAGNOSIS:  Left breast cancer at 12 o'clock position, 3.1 cm by MRI (T2, N0).  Final pathology pending.  PROCEDURE:  Wire localization of the left breast lumpectomy, left axillary sentinel lymph node biopsy, the injection of 40% methylene blue in the subareolar space for SLN localization.  SURGEON:  Sandria Bales. Ezzard Standing, M.D.  FIRST ASSISTANT:  None.  ANESTHESIA:  General LMA with approximately 30 mL of 0.25% Marcaine.  COMPLICATION:  None.  INDICATION FOR PROCEDURE:  [name] [name] is a 46 year old white female who sees Dr. Sigmund [name] who is her primary care doctor.  She presented with microcalcifications in her left breast and underwent a biopsy that showed a invasive ductal carcinoma.  The area of concern looked to be about 6 mm by ultrasound; however, on MRI this suggested a diameter of 3.1 cm. She now comes for lumpectomy to better define the size of this tumor.  I also discussed with her, left axillary sentinel lymph node biopsy.  I discussed with her the indications, potential complications of surgery.  Potential complications of surgery include, but are not limited to, bleeding, infection, nerve injury, the need for further surgery.  The patient came from the breast center where she had 2 wires in her left breast at the 12 o'clock position, which bracketed the microcalcification in the left breast.  In the preoperative area, her left breast was injected with 1 millicuries of technetium sulfur colloid, and I injected 1 mL of 40% methylene blue in the subareolar space in the OR after  she was asleep.  OPERATIVE NOTE:  The patient was taken to the operating room #5 where she underwent a general LMA anesthesia supervised by Dr. Autumn Patty.  She was given Ancef 2 g initially during procedure.  Her left breast was prepped with ChloraPrep, sterilely draped.  A time-out was held and surgical checklist run.  I first started in the left axilla.  I identified a hot node in the left axilla, so  I made incision posterior to the lateral edge of the pectoralis major Muscle.  I excised a block of axillary tissue, which contained a faint blue node with counts of 4500, the background was 15, and this was sent for permanent analysis of lymph node.  Hemostasis was controlled with Bovie electrocautery.  I then turned my attention to the breast.  She had 2 wires coming out of the 12 o'clock Position of her left breast, which bracketed the microcalcifications and clip.  I made a transverse incision across her upper breast, included both wires I excised a block of breast tissue about 5 cm in diameter down to the chest wall.  I then painted this with a 6-color paint set for orientation purposes.  I did a specimen mammogram, which  confirmed both wires in the middle of the breast with the clip in the middle, and I think the area of concern excised.  I then irrigated each wound with saline.  I infiltrated about 30 mL of 0.25% Marcaine in the breast and the axilla.  I then used small hemoclips to mark the 12, 3 o'clock, 6 o'clock, and 9 o'clock positions of the breast cavity and 2 clips were placed at the bottom of the breast cavity.  I then closed both wounds with interrupted 3-0 Vicryl sutures in a deep layer of 5-0 Monocryl in the skin.  Both wounds were painted with Dermabond and sterilely dressed.  She was placed with a breast binder.    She tolerated the procedure well.  The sponge and needle count were correct at the end of the case.  Final pathology is pending at the time of  this dictation.  She will be discharged home today.  See me back in 2 weeks for followup.   Sandria Bales. Ezzard Standing, M.D., FACS   DHN/MEDQ  D:  04/19/2011  T:  04/19/2011  Job:  [id number]  cc:   [name] [name], M.D. [name] [name], M.D. [name] [name], M.D.

## 2011-04-20 ENCOUNTER — Telehealth (INDEPENDENT_AMBULATORY_CARE_PROVIDER_SITE_OTHER): Payer: Self-pay | Admitting: Surgery

## 2011-04-21 ENCOUNTER — Telehealth (INDEPENDENT_AMBULATORY_CARE_PROVIDER_SITE_OTHER): Payer: Self-pay | Admitting: General Surgery

## 2011-04-21 NOTE — Telephone Encounter (Signed)
PT CALLED RE INCREASED BREAST PAIN SINCE YESTERDAY AFTER LUMPECTOMY AND SLNBX/ SHE HAS HAD TO TAKE MORE PAIN MEDICATION FOR PAIN AND BURNING ALONG INCISION LINE/ NO REDNESS OR DRAINAGE NOTED/ NO FEVER/ BREAST DOES NOT APPEAR ABNORMALLY SWOLLEN/ I REVIEWED THIS WIH DR. Ezzard Standing AND HE SUGGESTED ICE APPLICATION AND ADVIL OR ALEVE BETWEEN PAIN MEDS/ PT INDICATED ICE DOES RELIEVE THE DISCOMFORT/ I REVIEWED DR. Butch Penny SUGGESTION AND PT WILL CALL IF SYMPTOMS DO NO GET BETER/GY

## 2011-04-25 ENCOUNTER — Telehealth (INDEPENDENT_AMBULATORY_CARE_PROVIDER_SITE_OTHER): Payer: Self-pay

## 2011-04-25 NOTE — Telephone Encounter (Signed)
The patient left me a voicemail and I returned her call.  She would like to go ahead and schedule her re-excision surgery before 2/28 due to her insurance changing.  Dr Ezzard Standing is unavailable to write orders but I have asked the surgery schedulers if they can block a portion of time in the or schedule and hold it until he can write orders.  I left Dr Ezzard Standing a voicemail and the pt.

## 2011-04-26 ENCOUNTER — Encounter (INDEPENDENT_AMBULATORY_CARE_PROVIDER_SITE_OTHER): Payer: Self-pay

## 2011-04-26 ENCOUNTER — Telehealth (INDEPENDENT_AMBULATORY_CARE_PROVIDER_SITE_OTHER): Payer: Self-pay

## 2011-04-26 NOTE — Telephone Encounter (Signed)
The patient left me a voicemail that she needed a return to work note faxed.  She returned 2/11. I typed a note and faxed to [phone].

## 2011-04-29 NOTE — Telephone Encounter (Signed)
Already scheduled

## 2011-05-04 ENCOUNTER — Encounter: Payer: Self-pay | Admitting: *Deleted

## 2011-05-04 ENCOUNTER — Other Ambulatory Visit: Payer: Self-pay | Admitting: Oncology

## 2011-05-04 ENCOUNTER — Other Ambulatory Visit (INDEPENDENT_AMBULATORY_CARE_PROVIDER_SITE_OTHER): Payer: Self-pay | Admitting: Surgery

## 2011-05-04 ENCOUNTER — Encounter (INDEPENDENT_AMBULATORY_CARE_PROVIDER_SITE_OTHER): Payer: Self-pay | Admitting: Surgery

## 2011-05-04 ENCOUNTER — Ambulatory Visit (INDEPENDENT_AMBULATORY_CARE_PROVIDER_SITE_OTHER): Payer: BC Managed Care – PPO | Admitting: Surgery

## 2011-05-04 VITALS — BP 132/84 | HR 68 | Temp 97.8°F | Resp 16 | Ht 67.0 in | Wt 214.2 lb

## 2011-05-04 DIAGNOSIS — R921 Mammographic calcification found on diagnostic imaging of breast: Secondary | ICD-10-CM

## 2011-05-04 DIAGNOSIS — C50419 Malignant neoplasm of upper-outer quadrant of unspecified female breast: Secondary | ICD-10-CM

## 2011-05-04 DIAGNOSIS — C50919 Malignant neoplasm of unspecified site of unspecified female breast: Secondary | ICD-10-CM

## 2011-05-04 NOTE — Progress Notes (Signed)
Ordered Oncotype Dx test w/ [facility].  Faxed request to [facility].  Faxed PAC to [employer]. 

## 2011-05-04 NOTE — Progress Notes (Signed)
 Re:   [name] [name] DOB:   12/01/1965 MRN:   [id number]  ASSESSMENT AND PLAN: 1.  Left breast cancer, 12 o'clock (central in breast)  Lumpectomy 04/19/2011  Final pathology - 3.2 cm cancer - T2, N0.  Represented at Breast Cancer Conference.  Margin of DCIS within 1 mm.  NCCN guidelines suggests 2 mm margin for DCIS.  Plan:  Re-image breast to check calcifications. Re-excise lumpectomy cavity after image. Oncotype to determine further treatment.    Patient declined genetics.   2.  Lap band - 01/27/2009.  Initial weight 273, BMI 42.9.  She is happy with weight loss.  Her port is prominent.  Her way to loose weight is lap band, exercise, and Medifast. 3.  Hypertension. 4.  Osteoarthritis of back.  Better with weight loss.  Chief Complaint  Patient presents with  . Routine Post Op    Breast biopsy 03/21/11   REFERRING PHYSICIAN: MILLER,LISA LYNN, MD, MD  HISTORY OF PRESENT ILLNESS: [name] [name] is a 45 y.o. (DOB: 01/07/1966)  white female whose primary care physician is MILLER,LISA LYNN, MD, MD and comes to me today for follow up of surgery for left breast cancer.  She has done very well with her lap band, though she says the port is pouching out some.  She had a right breast biopsy in 08/07/2009, which showed pseudoangiomatous stromal hyperplasia.   She had a screening mammogram 03/21/2011 that showed a 3 x 2.3 cm area of calcifications in the upper central left breast.  She underwent a core biopsy which showed an invasive ductal carcinoma.  She had a lumpectomy and left axillary SLNBx on 04/19/2011.  Unfortunately, her inferior margin is close.  So I discussed with her re-imaging her breast and then proceeding with a re-excision.  Path (side, TNM): Invasive ductal ca, left T2, N0 Surgery: Left lumpectomt  Date: - 04/19/2011 Size of tumor: 3.2  (inferior margin 1 mm) Nodes: -/- ER: 57% PR: 97% Ki67: 10%  HER2Neu: -=Negative.  Medical Oncologist: - Magrinat Radiation Oncologist: -  Squire    Past Medical History  Diagnosis Date  . Status post biopsy 03/21/11    Breast, Left, Needle Core Biopsy, Upper Central - DCIS, Grade I-III with comedo Necrosis and Calcifications. ER+, PR+, Ki-67 10%, Her2- No Amplification  . Arthritis     Osteoarthritis Neck and Back  . Cancer     Left breast cancer  . Anemia     not at present     Current Outpatient Prescriptions  Medication Sig Dispense Refill  . cetirizine (ZYRTEC) 10 MG tablet Take 10 mg by mouth as needed.       . diclofenac (VOLTAREN) 0.1 % ophthalmic solution Place 1 drop into both eyes 4 (four) times daily as needed.      . hydrochlorothiazide (MICROZIDE) 12.5 MG capsule daily.       . ibuprofen (ADVIL,MOTRIN) 200 MG tablet Take 200 mg by mouth every 6 (six) hours as needed.      . Loratadine 10 MG CAPS Take by mouth as needed.       . MULTIPLE VITAMIN PO Take 1 tablet by mouth.      . phenylephrine (SUDAFED PE) 10 MG TABS Take 10 mg by mouth every 4 (four) hours as needed.      . zolpidem (AMBIEN) 5 MG tablet Take 5 mg by mouth at bedtime as needed.          Allergies  Allergen Reactions  . Hydrocodone-Acetaminophen Other (  See Comments)    Causes severe constipation. If pain medication is required, do not issue this type per patient request.    REVIEW OF SYSTEMS:  Cardiac:  Hypertension, though she is only on diuretics. Musculoskeletal:  HIstory of back pain/osteroarthritis.  This is better since her weight loss surgery.  SOCIAL and FAMILY HISTORY: Separated. Friend, Nancy Williams, is with her. She works in customer service at [employer].  PHYSICAL EXAM: BP 132/84  Pulse 68  Temp(Src) 97.8 F (36.6 C) (Temporal)  Resp 16  Ht 5' 7" (1.702 m)  Wt 214 lb 3.2 oz (97.16 kg)  BMI 33.55 kg/m2  LMP 04/19/2011  General: WN WF who is alert and generally healthy appearing.  HEENT: Normal. Pupils equal. Good dentition. Breasts:  Left: Transverse incision at 12 o'clock above nipple.  Well  healed.  Right:  Unremarkable. Lymph nodes:  Seroma in left axillary wound.  Otherwise okay.  DATA REVIEWED: Path  reprorts.  Jonaya Freshour, MD,  FACS [facility] [address]  [address]   [place], [place]    [address] Phone:  [phone] FAX:  [phone] 

## 2011-05-05 ENCOUNTER — Telehealth: Payer: Self-pay | Admitting: Oncology

## 2011-05-05 ENCOUNTER — Ambulatory Visit
Admission: RE | Admit: 2011-05-05 | Discharge: 2011-05-05 | Disposition: A | Discharge status: Home / Self Care | Payer: BC Managed Care – PPO | Source: Ambulatory Visit | Attending: Surgery | Admitting: Surgery

## 2011-05-05 ENCOUNTER — Telehealth: Payer: Self-pay | Admitting: *Deleted

## 2011-05-05 ENCOUNTER — Encounter (HOSPITAL_BASED_OUTPATIENT_CLINIC_OR_DEPARTMENT_OTHER): Payer: Self-pay | Admitting: *Deleted

## 2011-05-05 DIAGNOSIS — R921 Mammographic calcification found on diagnostic imaging of breast: Secondary | ICD-10-CM

## 2011-05-05 NOTE — Telephone Encounter (Signed)
S/w the pt and she is aware of her r/s apo\pts from feb to march

## 2011-05-05 NOTE — Telephone Encounter (Signed)
Spoke to pt concerning reasoning for r/s f/u appt with Dr. Darnelle Catalan.  Discussed Oncotype Dx testing.  Gave pt resource on oncotype testing.  Encourage pt to call with questions or concerns.  Received verbal understanding.  Contact information given.

## 2011-05-05 NOTE — Progress Notes (Signed)
Was here for lump/snbx 04/19/11-ekg was waved by dr Murrell Redden for fluid-can do [facility] if needed.

## 2011-05-09 ENCOUNTER — Other Ambulatory Visit (INDEPENDENT_AMBULATORY_CARE_PROVIDER_SITE_OTHER): Payer: Self-pay | Admitting: Surgery

## 2011-05-10 ENCOUNTER — Ambulatory Visit (HOSPITAL_BASED_OUTPATIENT_CLINIC_OR_DEPARTMENT_OTHER)
Admission: RE | Admit: 2011-05-10 | Discharge: 2011-05-10 | Disposition: A | Discharge status: Home / Self Care | Payer: BC Managed Care – PPO | Source: Ambulatory Visit | Attending: Surgery | Admitting: Surgery

## 2011-05-10 ENCOUNTER — Encounter (HOSPITAL_BASED_OUTPATIENT_CLINIC_OR_DEPARTMENT_OTHER): Payer: Self-pay | Admitting: *Deleted

## 2011-05-10 ENCOUNTER — Encounter (HOSPITAL_BASED_OUTPATIENT_CLINIC_OR_DEPARTMENT_OTHER): Payer: Self-pay | Admitting: Anesthesiology

## 2011-05-10 ENCOUNTER — Encounter (HOSPITAL_BASED_OUTPATIENT_CLINIC_OR_DEPARTMENT_OTHER): Payer: Self-pay | Admitting: Certified Registered Nurse Anesthetist

## 2011-05-10 ENCOUNTER — Encounter (HOSPITAL_BASED_OUTPATIENT_CLINIC_OR_DEPARTMENT_OTHER)
Admission: RE | Disposition: A | Discharge status: Home / Self Care | Payer: Self-pay | Source: Ambulatory Visit | Attending: Surgery

## 2011-05-10 ENCOUNTER — Ambulatory Visit (HOSPITAL_BASED_OUTPATIENT_CLINIC_OR_DEPARTMENT_OTHER): Payer: BC Managed Care – PPO | Admitting: Anesthesiology

## 2011-05-10 DIAGNOSIS — C50919 Malignant neoplasm of unspecified site of unspecified female breast: Secondary | ICD-10-CM | POA: Insufficient documentation

## 2011-05-10 DIAGNOSIS — D0590 Unspecified type of carcinoma in situ of unspecified breast: Secondary | ICD-10-CM | POA: Insufficient documentation

## 2011-05-10 DIAGNOSIS — M479 Spondylosis, unspecified: Secondary | ICD-10-CM | POA: Insufficient documentation

## 2011-05-10 DIAGNOSIS — Z9884 Bariatric surgery status: Secondary | ICD-10-CM | POA: Insufficient documentation

## 2011-05-10 DIAGNOSIS — I10 Essential (primary) hypertension: Secondary | ICD-10-CM | POA: Insufficient documentation

## 2011-05-10 HISTORY — PX: BREAST LUMPECTOMY: SHX2

## 2011-05-10 LAB — POCT I-STAT, CHEM 8
Chloride: 104 mEq/L (ref 96–112)
Glucose, Bld: 82 mg/dL (ref 70–99)
HCT: 39 % (ref 36.0–46.0)
Hemoglobin: 13.3 g/dL (ref 12.0–15.0)
Potassium: 3.9 mEq/L (ref 3.5–5.1)
Sodium: 141 mEq/L (ref 135–145)

## 2011-05-10 SURGERY — EXCISION, LESION, BREAST
Anesthesia: General | Site: Breast | Laterality: Left | Wound class: Clean

## 2011-05-10 MED ORDER — BUPIVACAINE HCL (PF) 0.25 % IJ SOLN
INTRAMUSCULAR | Status: DC | PRN
  Administered 2011-05-10: 32 mL

## 2011-05-10 MED ORDER — CHLORHEXIDINE GLUCONATE 4 % EX LIQD: 1.0000 "application " | Freq: Once | CUTANEOUS | Status: DC

## 2011-05-10 MED ORDER — FENTANYL CITRATE 0.05 MG/ML IJ SOLN
INTRAMUSCULAR | Status: DC | PRN
  Administered 2011-05-10: 50 ug via INTRAVENOUS
  Administered 2011-05-10 (×2): 25 ug via INTRAVENOUS

## 2011-05-10 MED ORDER — DEXAMETHASONE SODIUM PHOSPHATE 4 MG/ML IJ SOLN
INTRAMUSCULAR | Status: DC | PRN
  Administered 2011-05-10: 10 mg via INTRAVENOUS

## 2011-05-10 MED ORDER — MIDAZOLAM HCL 5 MG/5ML IJ SOLN
INTRAMUSCULAR | Status: DC | PRN
  Administered 2011-05-10: 2 mg via INTRAVENOUS

## 2011-05-10 MED ORDER — ACETAMINOPHEN 10 MG/ML IV SOLN
1000.0000 mg | Freq: Once | INTRAVENOUS | Status: AC
  Administered 2011-05-10: 1000 mg via INTRAVENOUS

## 2011-05-10 MED ORDER — DROPERIDOL 2.5 MG/ML IJ SOLN
INTRAMUSCULAR | Status: DC | PRN
  Administered 2011-05-10: 0.625 mg via INTRAVENOUS

## 2011-05-10 MED ORDER — LIDOCAINE HCL (CARDIAC) 20 MG/ML IV SOLN
INTRAVENOUS | Status: DC | PRN
  Administered 2011-05-10: 60 mg via INTRAVENOUS

## 2011-05-10 MED ORDER — ONDANSETRON HCL 4 MG/2ML IJ SOLN
INTRAMUSCULAR | Status: DC | PRN
  Administered 2011-05-10: 4 mg via INTRAVENOUS

## 2011-05-10 MED ORDER — HYDROMORPHONE HCL PF 1 MG/ML IJ SOLN
0.2500 mg | INTRAMUSCULAR | Status: DC | PRN
  Administered 2011-05-10 (×2): 0.5 mg via INTRAVENOUS

## 2011-05-10 MED ORDER — OXYCODONE HCL 5 MG PO TABS
5.0000 mg | ORAL_TABLET | Freq: Once | ORAL | Status: AC | PRN
  Administered 2011-05-10: 5 mg via ORAL

## 2011-05-10 MED ORDER — CEFAZOLIN SODIUM 1-5 GM-% IV SOLN
1.0000 g | INTRAVENOUS | Status: AC
  Administered 2011-05-10: 2 g via INTRAVENOUS

## 2011-05-10 MED ORDER — LACTATED RINGERS IV SOLN
INTRAVENOUS | Status: DC
  Administered 2011-05-10: 14:00:00 via INTRAVENOUS
  Administered 2011-05-10: 10 mL/h via INTRAVENOUS
  Administered 2011-05-10: 12:00:00 via INTRAVENOUS

## 2011-05-10 MED ORDER — METOCLOPRAMIDE HCL 5 MG/ML IJ SOLN: 10.0000 mg | Freq: Once | INTRAMUSCULAR | Status: DC | PRN

## 2011-05-10 MED ORDER — MORPHINE SULFATE 2 MG/ML IJ SOLN: 0.0500 mg/kg | INTRAMUSCULAR | Status: DC | PRN

## 2011-05-10 MED ORDER — PROPOFOL 10 MG/ML IV EMUL
INTRAVENOUS | Status: DC | PRN
  Administered 2011-05-10: 200 mg via INTRAVENOUS

## 2011-05-10 SURGICAL SUPPLY — 52 items
ADH SKN CLS APL DERMABOND .7 (GAUZE/BANDAGES/DRESSINGS) ×1
APL SKNCLS STERI-STRIP NONHPOA (GAUZE/BANDAGES/DRESSINGS)
BANDAGE ELASTIC 6 VELCRO ST LF (GAUZE/BANDAGES/DRESSINGS) IMPLANT
BENZOIN TINCTURE PRP APPL 2/3 (GAUZE/BANDAGES/DRESSINGS) IMPLANT
BINDER BREAST LRG (GAUZE/BANDAGES/DRESSINGS) IMPLANT
BINDER BREAST MEDIUM (GAUZE/BANDAGES/DRESSINGS) IMPLANT
BINDER BREAST XLRG (GAUZE/BANDAGES/DRESSINGS) IMPLANT
BINDER BREAST XXLRG (GAUZE/BANDAGES/DRESSINGS) IMPLANT
BLADE SURG 10 STRL SS (BLADE) ×2 IMPLANT
BLADE SURG 15 STRL LF DISP TIS (BLADE) IMPLANT
BLADE SURG 15 STRL SS (BLADE) ×2
CANISTER SUCTION 1200CC (MISCELLANEOUS) ×1 IMPLANT
CHLORAPREP W/TINT 26ML (MISCELLANEOUS) ×2 IMPLANT
CLIP TI WIDE RED SMALL 6 (CLIP) IMPLANT
CLOTH BEACON ORANGE TIMEOUT ST (SAFETY) ×2 IMPLANT
COVER MAYO STAND STRL (DRAPES) ×2 IMPLANT
COVER TABLE BACK 60X90 (DRAPES) ×2 IMPLANT
DECANTER SPIKE VIAL GLASS SM (MISCELLANEOUS) ×1 IMPLANT
DERMABOND ADVANCED (GAUZE/BANDAGES/DRESSINGS) ×1
DERMABOND ADVANCED .7 DNX12 (GAUZE/BANDAGES/DRESSINGS) IMPLANT
DEVICE DUBIN W/COMP PLATE 8390 (MISCELLANEOUS) IMPLANT
DRAPE PED LAPAROTOMY (DRAPES) ×2 IMPLANT
DRAPE UTILITY XL STRL (DRAPES) ×2 IMPLANT
ELECT COATED BLADE 2.86 ST (ELECTRODE) ×2 IMPLANT
ELECT REM PT RETURN 9FT ADLT (ELECTROSURGICAL) ×2
ELECTRODE REM PT RTRN 9FT ADLT (ELECTROSURGICAL) ×1 IMPLANT
GAUZE SPONGE 4X4 12PLY STRL LF (GAUZE/BANDAGES/DRESSINGS) IMPLANT
GAUZE SPONGE 4X4 16PLY XRAY LF (GAUZE/BANDAGES/DRESSINGS) IMPLANT
GLOVE BIO SURGEON STRL SZ7 (GLOVE) ×1 IMPLANT
GLOVE SURG SIGNA 7.5 PF LTX (GLOVE) ×3 IMPLANT
GOWN PREVENTION PLUS XLARGE (GOWN DISPOSABLE) ×2 IMPLANT
GOWN PREVENTION PLUS XXLARGE (GOWN DISPOSABLE) ×1 IMPLANT
KIT MARKER MARGIN INK (KITS) ×1 IMPLANT
NDL HYPO 25X1 1.5 SAFETY (NEEDLE) ×1 IMPLANT
NDL SAFETY ECLIPSE 18X1.5 (NEEDLE) IMPLANT
NEEDLE HYPO 18GX1.5 SHARP (NEEDLE)
NEEDLE HYPO 25X1 1.5 SAFETY (NEEDLE) ×2 IMPLANT
NS IRRIG 1000ML POUR BTL (IV SOLUTION) ×1 IMPLANT
PACK BASIN DAY SURGERY FS (CUSTOM PROCEDURE TRAY) ×2 IMPLANT
PENCIL BUTTON HOLSTER BLD 10FT (ELECTRODE) ×2 IMPLANT
SLEEVE SCD COMPRESS KNEE MED (MISCELLANEOUS) ×2 IMPLANT
SPONGE LAP 4X18 X RAY DECT (DISPOSABLE) ×2 IMPLANT
STRIP CLOSURE SKIN 1/4X4 (GAUZE/BANDAGES/DRESSINGS) IMPLANT
SUT VIC AB 3-0 SH 27 (SUTURE) ×2
SUT VIC AB 3-0 SH 27X BRD (SUTURE) IMPLANT
SUT VIC AB 5-0 PS2 18 (SUTURE) ×2 IMPLANT
SUT VICRYL 3-0 CR8 SH (SUTURE) ×2 IMPLANT
SYR CONTROL 10ML LL (SYRINGE) ×2 IMPLANT
TOWEL OR NON WOVEN STRL DISP B (DISPOSABLE) ×2 IMPLANT
TUBE CONNECTING 20X1/4 (TUBING) ×1 IMPLANT
WATER STERILE IRR 1000ML POUR (IV SOLUTION) ×1 IMPLANT
YANKAUER SUCT BULB TIP NO VENT (SUCTIONS) ×1 IMPLANT

## 2011-05-10 NOTE — Brief Op Note (Signed)
05/10/2011  2:30 PM  PATIENT:  [name] [name], 46 y.o., female, MRN: [id number]  PREOP DIAGNOSIS:  re-excise left breast lumpectomy  POSTOP DIAGNOSIS:   Left breast cancer (T2, N0), close inferior margin  PROCEDURE:   Procedure(s): RE-EXCISION OF Left BREAST LUMPECTOMY  SURGEON:   Ovidio Kin, M.D.  ASSISTANT:   none  ANESTHESIA:   general  Constance Goltz, MD - Anesthesiologist Jewel Baize Blocker, CRNA - CRNA  General  EBL:  <50  ml  BLOOD ADMINISTERED: none  DRAINS: none   LOCAL MEDICATIONS USED:   30 cc 1/4% marcaine  SPECIMEN:   Inferior margin, left breast biopsy  COUNTS CORRECT:  YES  INDICATIONS FOR PROCEDURE:  Detria Cummings is a 46 y.o. (DOB: 1965/06/10) white female whose primary care physician is Neldon Labella, MD, MD and comes for re-excision left breast biopsy.   The indications and risks of the surgery were explained to the patient.  The risks include, but are not limited to, infection, bleeding, and nerve injury.  Note dictated to:   #[id number].

## 2011-05-10 NOTE — Discharge Instructions (Signed)
[facility]  [address] [place], [place] [address] [phone]   Post Anesthesia Home Care Instructions  Activity: Get plenty of rest for the remainder of the day. A responsible adult should stay with you for 24 hours following the procedure.  For the next 24 hours, DO NOT: -Drive a car -Operate machinery -Drink alcoholic beverages -Take any medication unless instructed by your physician -Make any legal decisions or sign important papers.  Meals: Start with liquid foods such as gelatin or soup. Progress to regular foods as tolerated. Avoid greasy, spicy, heavy foods. If nausea and/or vomiting occur, drink only clear liquids until the nausea and/or vomiting subsides. Call your physician if vomiting continues.  Special Instructions/Symptoms: Your throat may feel dry or sore from the anesthesia or the breathing tube placed in your throat during surgery. If this causes discomfort, gargle with warm salt water. The discomfort should disappear within 24 hours.   

## 2011-05-10 NOTE — H&P (View-Only) (Signed)
Re:   [name] [name] DOB:   [birth year] MRN:   [id number]  ASSESSMENT AND PLAN: 1.  Left breast cancer, 12 o'clock (central in breast)  Lumpectomy 04/19/2011  Final pathology - 3.2 cm cancer - T2, N0.  Represented at Breast Cancer Conference.  Margin of DCIS within 1 mm.  NCCN guidelines suggests 2 mm margin for DCIS.  Plan:  Re-image breast to check calcifications. Re-excise lumpectomy cavity after image. Oncotype to determine further treatment.    Patient declined genetics.   2.  Lap band - 01/27/2009.  Initial weight 273, BMI 42.9.  She is happy with weight loss.  Her port is prominent.  Her way to loose weight is lap band, exercise, and Medifast. 3.  Hypertension. 4.  Osteoarthritis of back.  Better with weight loss.  Chief Complaint  Patient presents with  . Routine Post Op    Breast biopsy 03/21/11   REFERRING PHYSICIAN: Neldon Labella, MD, MD  HISTORY OF PRESENT ILLNESS: [name] [name] is a 46 y.o. (DOB: 07-10-1965)  white female whose primary care physician is Neldon Labella, MD, MD and comes to me today for follow up of surgery for left breast cancer.  She has done very well with her lap band, though she says the port is pouching out some.  She had a right breast biopsy in 08/07/2009, which showed pseudoangiomatous stromal hyperplasia.   She had a screening mammogram 03/21/2011 that showed a 3 x 2.3 cm area of calcifications in the upper central left breast.  She underwent a core biopsy which showed an invasive ductal carcinoma.  She had a lumpectomy and left axillary SLNBx on 04/19/2011.  Unfortunately, her inferior margin is close.  So I discussed with her re-imaging her breast and then proceeding with a re-excision.  Path (side, TNM): Invasive ductal ca, left T2, N0 Surgery: Left lumpectomt  Date: - 04/19/2011 Size of tumor: 3.2  (inferior margin 1 mm) Nodes: -/- ER: 57% PR: 97% Ki67: 10%  HER2Neu: -=Negative.  Medical Oncologist: - Magrinat Radiation Oncologist: Basilio Cairo    Past Medical History  Diagnosis Date  . Status post biopsy 03/21/11    Breast, Left, Needle Core Biopsy, Upper Central - DCIS, Grade I-III with comedo Necrosis and Calcifications. ER+, PR+, Ki-67 10%, Her2- No Amplification  . Arthritis     Osteoarthritis Neck and Back  . Cancer     Left breast cancer  . Anemia     not at present     Current Outpatient Prescriptions  Medication Sig Dispense Refill  . cetirizine (ZYRTEC) 10 MG tablet Take 10 mg by mouth as needed.       . diclofenac (VOLTAREN) 0.1 % ophthalmic solution Place 1 drop into both eyes 4 (four) times daily as needed.      . hydrochlorothiazide (MICROZIDE) 12.5 MG capsule daily.       Marland Kitchen ibuprofen (ADVIL,MOTRIN) 200 MG tablet Take 200 mg by mouth every 6 (six) hours as needed.      . Loratadine 10 MG CAPS Take by mouth as needed.       . MULTIPLE VITAMIN PO Take 1 tablet by mouth.      . phenylephrine (SUDAFED PE) 10 MG TABS Take 10 mg by mouth every 4 (four) hours as needed.      . zolpidem (AMBIEN) 5 MG tablet Take 5 mg by mouth at bedtime as needed.          Allergies  Allergen Reactions  . Hydrocodone-Acetaminophen Other (  See Comments)    Causes severe constipation. If pain medication is required, do not issue this type per patient request.    REVIEW OF SYSTEMS:  Cardiac:  Hypertension, though she is only on diuretics. Musculoskeletal:  HIstory of back pain/osteroarthritis.  This is better since her weight loss surgery.  SOCIAL and FAMILY HISTORY: Separated. Blossom Hoops, is with her. She works in Clinical biochemist at [employer].  PHYSICAL EXAM: BP 132/84  Pulse 68  Temp(Src) 97.8 F (36.6 C) (Temporal)  Resp 16  Ht 5\' 7"  (1.702 m)  Wt 214 lb 3.2 oz (97.16 kg)  BMI 33.55 kg/m2  LMP 04/19/2011  General: WN WF who is alert and generally healthy appearing.  HEENT: Normal. Pupils equal. Good dentition. Breasts:  Left: Transverse incision at 12 o'clock above nipple.  Well  healed.  Right:  Unremarkable. Lymph nodes:  Seroma in left axillary wound.  Otherwise okay.  DATA REVIEWED: Path  reprorts.  Ovidio Kin, MD,  [facility] [address] [place]  [address]   [place], [place]    [address] Phone:  [phone] FAX:  [phone]

## 2011-05-10 NOTE — Interval H&P Note (Signed)
History and Physical Interval Note:  05/10/2011 1:08 PM  Magdalyn Arenivas  has presented today for surgery, with the diagnosis of re excise lumpectomy   The various methods of treatment have been discussed with the patient and family. [name] [name], with patient.  The repeat mammogram showed no residual macrocalcifications.  After consideration of risks, benefits and other options for treatment, the patient has consented to  Procedure(s) (LRB):  RE-EXCISION OF BREAST LUMPECTOMY (Left) as a surgical intervention .    The patients' history has been reviewed, patient examined, no change in status, stable for surgery.  I have reviewed the patients' chart and labs.  Questions were answered to the patient's satisfaction.     Lonney Revak H

## 2011-05-10 NOTE — Transfer of Care (Signed)
Immediate Anesthesia Transfer of Care Note  Patient: [name] [name]  Procedure(s) Performed: Procedure(s) (LRB): RE-EXCISION OF BREAST LUMPECTOMY (Left)  Patient Location: PACU  Anesthesia Type: General  Level of Consciousness: awake, alert , oriented and patient cooperative  Airway & Oxygen Therapy: Patient Spontanous Breathing and Patient connected to face mask oxygen  Post-op Assessment: Report given to PACU RN and Post -op Vital signs reviewed and stable  Post vital signs: Reviewed and stable  Complications: No apparent anesthesia complications

## 2011-05-10 NOTE — Anesthesia Postprocedure Evaluation (Signed)
Anesthesia Post Note  Patient: [name] [name]  Procedure(s) Performed: Procedure(s) (LRB): RE-EXCISION OF BREAST LUMPECTOMY (Left)  Anesthesia type: General  Patient location: PACU  Post pain: Pain level controlled  Post assessment: Patient's Cardiovascular Status Stable  Last Vitals:  Filed Vitals:   05/10/11 1636  BP: 135/80  Pulse: 66  Temp: 37 C  Resp: 16    Post vital signs: Reviewed and stable  Level of consciousness: alert  Complications: No apparent anesthesia complications

## 2011-05-10 NOTE — Anesthesia Preprocedure Evaluation (Signed)
Anesthesia Evaluation  Patient identified by MRN, date of birth, ID band Patient awake    Reviewed: Allergy & Precautions, H&P , NPO status , Patient's Chart, lab work & pertinent test results, reviewed documented beta blocker date and time   Airway Mallampati: II TM Distance: >3 FB Neck ROM: full    Dental   Pulmonary neg pulmonary ROS,          Cardiovascular hypertension,     Neuro/Psych Negative Neurological ROS  Negative Psych ROS   GI/Hepatic negative GI ROS, Neg liver ROS,   Endo/Other  Negative Endocrine ROS  Renal/GU negative Renal ROS  Genitourinary negative   Musculoskeletal   Abdominal   Peds  Hematology negative hematology ROS (+)   Anesthesia Other Findings See surgeon's H&P   Reproductive/Obstetrics negative OB ROS                           Anesthesia Physical Anesthesia Plan  ASA: II  Anesthesia Plan: General   Post-op Pain Management:    Induction: Intravenous  Airway Management Planned: LMA  Additional Equipment:   Intra-op Plan:   Post-operative Plan: Extubation in OR  Informed Consent: I have reviewed the patients History and Physical, chart, labs and discussed the procedure including the risks, benefits and alternatives for the proposed anesthesia with the patient or authorized representative who has indicated his/her understanding and acceptance.     Plan Discussed with: CRNA and Surgeon  Anesthesia Plan Comments:         Anesthesia Quick Evaluation  

## 2011-05-10 NOTE — Anesthesia Procedure Notes (Signed)
Procedure Name: LMA Insertion Date/Time: 05/10/2011 1:22 PM Performed by: Brynlie Daza D Pre-anesthesia Checklist: Patient identified, Emergency Drugs available, Suction available and Patient being monitored Patient Re-evaluated:Patient Re-evaluated prior to inductionOxygen Delivery Method: Circle System Utilized Preoxygenation: Pre-oxygenation with 100% oxygen Intubation Type: IV induction Ventilation: Mask ventilation without difficulty LMA: LMA inserted LMA Size: 4.0 Number of attempts: 1 Airway Equipment and Method: bite block Placement Confirmation: positive ETCO2 Tube secured with: Tape Dental Injury: Teeth and Oropharynx as per pre-operative assessment

## 2011-05-11 ENCOUNTER — Other Ambulatory Visit: Payer: BC Managed Care – PPO | Admitting: Lab

## 2011-05-11 ENCOUNTER — Ambulatory Visit: Payer: BC Managed Care – PPO | Admitting: Oncology

## 2011-05-11 ENCOUNTER — Telehealth (INDEPENDENT_AMBULATORY_CARE_PROVIDER_SITE_OTHER): Payer: Self-pay

## 2011-05-11 NOTE — Op Note (Signed)
NAME:  [name] [name], [name] [name]                   ACCOUNT NO.:  MEDICAL RECORD NO.:  [id number]  LOCATION:                                 FACILITY:  PHYSICIAN:  Sandria Bales. Ezzard Standing, M.D.  DATE OF BIRTH:  [birth year]  DATE OF PROCEDURE:  05/10/2011                              OPERATIVE REPORT  PREOPERATIVE DIAGNOSIS:  Left breast cancer (T2, N0).  POSTOPERATIVE DIAGNOSIS:  Left breast cancer, close inferior margin (T2, N0).  PROCEDURE:  Left breast biopsy, resection of inferior margin.  SURGEON:  Sandria Bales. Ezzard Standing, MD  ASSISTANT:  There is no first assistant.  ANESTHESIA:  General LMA with 30 mL of 0.25% Marcaine.  COMPLICATIONS:  None.  INDICATIONS FOR PROCEDURE:  [name] is a 46 year old, white female who sees Dr. Zada Girt as her primary medical doctor.  She had a left breast lumpectomy on April 19, 2011, that showed a 3.2-cm invasive ductal carcinoma of her left breast.  Unfortunately, her inferior margin of DCIS was within 1 mm and after reviewing this with Dr. Basilio Cairo, it was felt best she went for a wider re-excision of the inferior margin.  She has had an interval mammogram which showed no residual microcalcifications.  The indication, potential complications of reexcision were explained to the patient.  Potential complications include, but not limited to, bleeding, infection, nerve injury, and the possible need for further surgery.  OPERATIVE NOTE:  The patient was placed in a supine position.  Her left breast was prepped with ChloraPrep and sterilely draped.  A time-out was held and surgical checklist run.  She underwent a general LMA anesthesia, supervised by Dr. Hart Robinsons in room number 8 at [facility].  The breast was prepped with ChloraPrep, sterilely draped.  An excision was made to excise her breast scar.  She had a cavity which was previous size at the superior aspect of her left breast.  I excised the inferior cavity finding another cm of tissue.   After excising this, I then painted it so that the superior aspect of the biopsy mass of the old inferior biopsy wall, I put a long suture medially and used the paint colors to clear the margins.  I then irrigated the wound with saline.  I used electrocautery for control of bleeding.  I then closed the wound in layers with 3-0 Vicryl sutures in the deep layer and subcutaneous layer and then a 5-0 Monocryl for a subcuticular layer.  The wound was painted with Dermabond.  She was sterilely dressed with 4x4s, Hypafix, and breast dressing was placed on her.  She tolerated procedure well and was transferred to the recovery room in good condition.  Sponge and needle count were correct.  Sandria Bales. Ezzard Standing, M.D., FACS   DHN/MEDQ  D:  05/10/2011  T:  05/10/2011  Job:  [id number]  cc:   Sigmund Hazel, M.D. Lowella Dell, M.D. Grayland Jack, M.D.

## 2011-05-11 NOTE — Telephone Encounter (Signed)
Pt called stating she was not able to get percocet rx filled yesterday due to no dosage is on rx. Pt states she had a few pain pills from previous surgery and was able to use those last night. Pt request the rx be corrected so she can fill it. Pt to bring rx by office today for our MD to review and correct or re-write rx. Pt advised to have someone drive her since she is taking narcotic med.  Huntley Dec advised pt will be coming by.

## 2011-05-16 ENCOUNTER — Telehealth (INDEPENDENT_AMBULATORY_CARE_PROVIDER_SITE_OTHER): Payer: Self-pay

## 2011-05-16 ENCOUNTER — Encounter (INDEPENDENT_AMBULATORY_CARE_PROVIDER_SITE_OTHER): Payer: Self-pay

## 2011-05-16 NOTE — Telephone Encounter (Signed)
I notified the pt of her path results and faxed a return to work note to [phone].

## 2011-05-18 ENCOUNTER — Ambulatory Visit (INDEPENDENT_AMBULATORY_CARE_PROVIDER_SITE_OTHER): Payer: BC Managed Care – PPO | Admitting: Surgery

## 2011-05-18 ENCOUNTER — Encounter (INDEPENDENT_AMBULATORY_CARE_PROVIDER_SITE_OTHER): Payer: Self-pay | Admitting: Surgery

## 2011-05-18 VITALS — BP 116/76 | HR 70 | Temp 98.1°F | Resp 18 | Ht 67.0 in | Wt 210.8 lb

## 2011-05-18 DIAGNOSIS — C50419 Malignant neoplasm of upper-outer quadrant of unspecified female breast: Secondary | ICD-10-CM

## 2011-05-18 NOTE — Progress Notes (Addendum)
Re:   [name] [name] DOB:   07-Dec-1965 MRN:   [id number]  ASSESSMENT AND PLAN: 1.  Left breast cancer, 12 o'clock (central in breast)  Lumpectomy 04/19/2011  Final pathology - 3.2 cm cancer - T2, N0.  Re-excision of inferior margin was negative.  Sees Drs. Magrinat and Basilio Cairo.  Oncotype to determine further treatment.  These are pending.  [Oncotype - 20.  Associated with 13% recurrence rate.  DN 05/18/2101]  Patient declined genetics.  I will otherwise see her in 6 months, unless there is some other problem.   2.  Lap band - 01/27/2009.  Initial weight 273, BMI 42.9.  She is happy with weight loss.  Her port is prominent.  Her way to loose weight is lap band, exercise, and Medifast. 3.  Hypertension. 4.  Osteoarthritis of back.  Better with weight loss.  Chief Complaint  Patient presents with  . Routine Post Op    Breast biopsy 03/21/11   REFERRING PHYSICIAN: Neldon Labella, MD, MD  HISTORY OF PRESENT ILLNESS: [name] [name] is a 46 y.o. (DOB: 04-28-1965)  white female whose primary care physician is [name] Labella, MD, MD and comes to me today for follow up of surgery for left breast cancer.  She has done very well with her lap band, though she says the port is pouching out some.  She had a right breast biopsy in 08/07/2009, which showed pseudoangiomatous stromal hyperplasia.   She had a screening mammogram 03/21/2011 that showed a 3 x 2.3 cm area of calcifications in the upper central left breast.  She underwent a core biopsy which showed an invasive ductal carcinoma.  She had a lumpectomy and left axillary SLNBx on 04/19/2011.  Unfortunately, her inferior margin is close.  But re-excision on 05/10/2011 was negative.  She has done well with surgery.  She is awaiting the Oncotype results and she is to see Dr. Darnelle Catalan next week.  Path (side, TNM): Invasive ductal ca, left T2, N0 Surgery: Left lumpectomt  Date: - 04/19/2011 Size of tumor: 3.2  (inferior margin 1 mm) Nodes: -/- ER:  57% PR: 97% Ki67: 10%  HER2Neu: -=Negative.  Medical Oncologist: - Magrinat Radiation Oncologist: Basilio Cairo    Past Medical History  Diagnosis Date  . Status post biopsy 03/21/11    Breast, Left, Needle Core Biopsy, Upper Central - DCIS, Grade I-III with comedo Necrosis and Calcifications. ER+, PR+, Ki-67 10%, Her2- No Amplification  . Arthritis     Osteoarthritis Neck and Back  . Cancer     Left breast cancer  . Anemia     not at present     Current Outpatient Prescriptions  Medication Sig Dispense Refill  . cetirizine (ZYRTEC) 10 MG tablet Take 10 mg by mouth as needed.       . diclofenac (VOLTAREN) 0.1 % ophthalmic solution Place 1 drop into both eyes 4 (four) times daily as needed.      . hydrochlorothiazide (MICROZIDE) 12.5 MG capsule daily.       Marland Kitchen ibuprofen (ADVIL,MOTRIN) 200 MG tablet Take 200 mg by mouth every 6 (six) hours as needed.      . Loratadine 10 MG CAPS Take by mouth as needed.       . MULTIPLE VITAMIN PO Take 1 tablet by mouth.      . phenylephrine (SUDAFED PE) 10 MG TABS Take 10 mg by mouth every 4 (four) hours as needed.      . zolpidem (AMBIEN) 5 MG tablet Take 5  mg by mouth at bedtime as needed.          Allergies  Allergen Reactions  . Hydrocodone-Acetaminophen Other (See Comments)    Causes severe constipation. If pain medication is required, do not issue this type per patient request.   REVIEW OF SYSTEMS:  Cardiac:  Hypertension, though she is only on diuretics. Musculoskeletal:  HIstory of back pain/osteroarthritis.  This is better since her weight loss surgery.  SOCIAL and FAMILY HISTORY: Separated. She works in Clinical biochemist at [employer].  PHYSICAL EXAM: BP 132/84  Pulse 68  Temp(Src) 97.8 F (36.6 C) (Temporal)  Resp 16  Ht 5\' 7"  (1.702 m)  Wt 214 lb 3.2 oz (97.16 kg)  BMI 33.55 kg/m2  LMP 04/19/2011  General: WN WF who is alert and generally healthy appearing.  HEENT: Normal. Pupils equal. Good dentition. Breasts:  Left:  Transverse incision at 12 o'clock above nipple.  Looks good.  Her right axillary incision looks good.  Right:  Unremarkable. Lymph nodes:  Okay.  DATA REVIEWED: Path  report to patient.  [name] Kin, MD,  [facility] [address] [place]  [address]   [place], [place]    [address] Phone:  [phone] FAX:  [phone]

## 2011-05-20 ENCOUNTER — Encounter: Payer: Self-pay | Admitting: *Deleted

## 2011-05-20 NOTE — Progress Notes (Signed)
Received Oncotype Dx results of 20.  Gave copy to MD & Med Rec.

## 2011-05-27 ENCOUNTER — Telehealth: Payer: Self-pay | Admitting: Oncology

## 2011-05-27 ENCOUNTER — Other Ambulatory Visit (HOSPITAL_BASED_OUTPATIENT_CLINIC_OR_DEPARTMENT_OTHER): Payer: BC Managed Care – PPO | Admitting: Lab

## 2011-05-27 ENCOUNTER — Ambulatory Visit (HOSPITAL_BASED_OUTPATIENT_CLINIC_OR_DEPARTMENT_OTHER): Payer: BC Managed Care – PPO | Admitting: Oncology

## 2011-05-27 VITALS — BP 133/84 | HR 57 | Temp 98.7°F | Ht 67.0 in | Wt 212.2 lb

## 2011-05-27 DIAGNOSIS — C50919 Malignant neoplasm of unspecified site of unspecified female breast: Secondary | ICD-10-CM

## 2011-05-27 DIAGNOSIS — C50419 Malignant neoplasm of upper-outer quadrant of unspecified female breast: Secondary | ICD-10-CM

## 2011-05-27 DIAGNOSIS — Z170 Estrogen receptor positive status [ER+]: Secondary | ICD-10-CM

## 2011-05-27 LAB — CBC WITH DIFFERENTIAL/PLATELET
EOS%: 1.9 % (ref 0.0–7.0)
Eosinophils Absolute: 0.2 10*3/uL (ref 0.0–0.5)
MCH: 25.9 pg (ref 25.1–34.0)
MCV: 79.9 fL (ref 79.5–101.0)
MONO%: 6.4 % (ref 0.0–14.0)
NEUT#: 6.5 10*3/uL (ref 1.5–6.5)
RBC: 4.37 10*6/uL (ref 3.70–5.45)
RDW: 14.5 % (ref 11.2–14.5)
nRBC: 0 % (ref 0–0)

## 2011-05-27 NOTE — Telephone Encounter (Signed)
gve the pt her march 2013 appt calendar 

## 2011-05-29 NOTE — Progress Notes (Signed)
ID: [name] [name]   DOB: 1965/11/17  MR#: [id number]  NWG#:[id number]  HISTORY OF PRESENT ILLNESS: [name] [name] is a 46 year old [place] woman who underwent a yearly screening mammography in 02/11/2011. This showed heterogeneously dense tissue and a possible mass in the left breast. Additional views 03/21/2011 showed an area of amorphous calcifications in the upper central aspect of the left breast, measuring up to 3.0 cm. There was no associated mass. Biopsy was performed the same day and showed ([id number]) invasive ductal breast cancer, grade 1-2, with ductal carcinoma in situ, grade 1-3. The invasive tumor was 57% estrogen receptor positive, 97% progesterone receptor positive, with an MIB-1 of 10% and no HER-2 amplification.  With this information the patient proceeded to bilateral breast MRIs 03/31/2011 this showed a round enhancing mass with irregular margins measuring 6 mm, with an associated biopsy clip. There was additional clumped and linear enhancement of compatible with DCIS the total area measuring 3.1 cm. There were no other suspicious masses in either breast and there was no axillary or internal mammary adenopathy noted  INTERVAL HISTORY: Since her last visit here the patient underwent left lumpectomy and sentinel lymph node sampling, with results as detailed below. She also had an Oncotype DX sent. Because of initially positive margins she required reexcision 05/10/2011, that pathology showing no residual tumor.  REVIEW OF SYSTEMS: She did well with her surgery, with no unusual pain, bleeding, dehiscence, fever, swelling, or erythema. She has a little bit of a runny nose, mild chronic low back pain, which is not increased in intensity or frequency from baseline, history of chronic constipation, but overall she describes himself as "perfect". A detailed review of systems is otherwise noncontributory  PAST MEDICAL HISTORY: Past Medical History  Diagnosis Date  . Status post biopsy  03/21/11    Breast, Left, Needle Core Biopsy, Upper Central - DCIS, Grade I-III with comedo Necrosis and Calcifications. ER+, PR+, Ki-67 10%, Her2- No Amplification  . Arthritis     Osteoarthritis Neck and Back  . Cancer     Left breast cancer  . Anemia     not at present  She had a right breast biopsy remotely, which was benign. She has mild degenerative disc disease involving the cervical and lumbar spines. She status post lap band surgery November of 2010, and has lost 92 pounds so far. She has a history of seasonal allergies, hypertension which is improving eyes she continues to lose weight, history of postpartum migraines, resolved, history of seizure, remote, and is status post C-section x2 and dilatation and curettage times one   PAST SURGICAL HISTORY: Past Surgical History  Procedure Date  . Laparoscopic gastric banding 01/27/2009  . Cesarean section 1999, 2005  . Biopsy breast 08/07/09    Breast, Right Needle core Biopsy, UOQ - Pseudoangiomatous Stromal hyperplasia  . Breast biopsy 03/21/11  . Breast lumpectomy 04/19/11    snbx    FAMILY HISTORY Family History  Problem Relation Age of Onset  . Stroke Mother     Stroke - mini  . Cancer Father     Lymphoma - passed age 59  The patient's father died at the age of 58 from sepsis in the setting of lymphoma. The patient's mother died at the age of 60 after a heart attack in the setting of multiple strokes. The patient had no brothers. She has 3 sisters. There is no history of breast or ovarian cancer in the family   GYNECOLOGIC HISTORY: Menarche age 73, first pregnancy to term  age 39. She is GX P2. Most recent menstrual period was 03/21/2011. Her periods usually last for 5 days, only one of which is heavy.  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 Her husband then is an Art gallery manager. They are separated but not divorced. The patient has 59 and 45-year-old sons at home, which sheared  custody. She understands that in the absence of a formal divorced her husband will have a hematocrit healthcare power of attorney in case of a disabling event. She says this is not a problem for her. She attends [employer] locally    ADVANCED DIRECTIVES:  HEALTH MAINTENANCE: History  Substance Use Topics  . Smoking status: Never Smoker   . Smokeless tobacco: Never Used  . Alcohol Use: Yes     rarely     Colonoscopy:  PAP:  Bone density:  Lipid panel:  No Active Allergies  Current Outpatient Prescriptions  Medication Sig Dispense Refill  . cetirizine (ZYRTEC) 10 MG tablet Take 10 mg by mouth as needed.       . hydrochlorothiazide (MICROZIDE) 12.5 MG capsule daily.       Marland Kitchen ibuprofen (ADVIL,MOTRIN) 200 MG tablet Take 200 mg by mouth every 6 (six) hours as needed.      . Loratadine 10 MG CAPS Take by mouth as needed.       . MULTIPLE VITAMIN PO Take 1 tablet by mouth.      . phenylephrine (SUDAFED PE) 10 MG TABS Take 10 mg by mouth every 4 (four) hours as needed.      . diclofenac (VOLTAREN) 0.1 % ophthalmic solution Place 1 drop into both eyes 4 (four) times daily as needed.      Marland Kitchen oxyCODONE-acetaminophen (PERCOCET) 5-325 MG per tablet Take 1 tablet by mouth every 4 (four) hours as needed.      . zolpidem (AMBIEN) 5 MG tablet Take 5 mg by mouth at bedtime as needed.        OBJECTIVE: Middle-aged white woman who appears comfortable Filed Vitals:   05/27/11 0944  BP: 133/84  Pulse: 57  Temp: 98.7 F (37.1 C)     Body mass index is 33.24 kg/(m^2).    ECOG FS: 0  Sclerae unicteric Oropharynx clear No peripheral adenopathy Lungs no rales or rhonchi Heart regular rate and rhythm Abd benign MSK no focal spinal tenderness, no peripheral edema Neuro: nonfocal Breasts: Right breast no suspicious findings; left breast status post lumpectomy; there is no dehiscence, erythema, swelling, or unusual tenderness. There is no evidence of local recurrent  LAB  RESULTS: Lab Results  Component Value Date   WBC 9.7 05/27/2011   NEUTROABS 6.5 05/27/2011   HGB 11.3* 05/27/2011   HCT 34.9 05/27/2011   MCV 79.9 05/27/2011   PLT 277 05/27/2011      Chemistry      Component Value Date/Time   NA 141 05/10/2011 1145   K 3.9 05/10/2011 1145   CL 104 05/10/2011 1145   CO2 31 04/18/2011 0830   BUN 8 05/10/2011 1145   CREATININE 0.80 05/10/2011 1145      Component Value Date/Time   CALCIUM 9.3 04/18/2011 0830   ALKPHOS 17* 04/05/2011 1331   AST 20 04/05/2011 1331   ALT 17 04/05/2011 1331   BILITOT 0.5 04/05/2011 1331       Lab Results  Component Value Date   LABCA2 23 04/05/2011    No components found with this basename: [id number]  No results found for this basename: INR:1;PROTIME:1 in the last 168 hours  Urinalysis No results found for this basename: colorurine, appearanceur, labspec, phurine, glucoseu, hgbur, bilirubinur, ketonesur, proteinur, urobilinogen, nitrite, leukocytesur       STUDIES: Mm Digital Diagnostic Unilat L  05/05/2011  *RADIOLOGY REPORT*  Clinical Data:  The patient underwent left lumpectomy for breast cancer on 04/19/2011.  A close margin was identified.  Left mammogram is requested to evaluate for residual calcifications prior to reexcision.  DIGITAL DIAGNOSTIC LEFT MAMMOGRAM WITH CAD  Comparison:  03/21/2011, 02/11/2011, 02/08/2010  Findings:  As the patient is recently postop, the examination was tailored to cause as little pain as possible.  We obtained CC and magnification CC views as well as an ML magnification view of the upper portion of the left breast.  Post lumpectomy changes are present.  Surgical clips are in the lumpectomy bed.   No suspicious residual calcifications are identified however one must note that compression is somewhat limited and postoperative change may obscure very fine calcifications. Mammographic images were processed with CAD.  IMPRESSION: No suspicious residual calcifications are identified given the  limitations of limited compression and postoperative change.  BI-RADS CATEGORY 2:  Benign finding(s).  Original Report Authenticated By: Daryl Eastern, M.D.    ASSESSMENT: 46 year old [place] woman status post Left lumpectomy 04/19/2011 for a T2 N0, stage IIA invasive ductal carcinoma. Grade 1 estrogen and progesterone receptor positive at 57% and 97% respectively, HER-2 negative, with an MIB-1 of 10. Her Oncotype Dx predicts a distant recurrence rate of 13% if her only systemic treatment is tamoxifen for 5 years.   PLAN: We spent the better part of her hour-long visit today discussing the question of whether she should receive chemotherapy. The prognostic panel of her cancer tells [place] it is likely a luminal A subtype, and these cancers generally do well without chemotherapy and in fact benefit very little from chemotherapy. However without a micro-RAI can only make that statement with an 80% confidence. The Oncotype score falls in the intermediate range, which does not make the chemotherapy decision for [place]. She understands that if we do 2 years of tamoxifen followed by 3 years of an aromatase inhibitor, or 5 years of tamoxifen followed by 5 years of an aromatase inhibitor, or 10 years of tamoxifen, all of those of will furthermore her risk of recurrence by 2-3%, leaving her with a residual risk of recurrence in the 10-11% range.  Chemotherapy generally reduces risk by about a third, so I would've her a 4% benefit from chemotherapy. This means that out of 100 women like her 27 would get no benefit from chemotherapy because there were "cured" of without chemotherapy, and 7 he got no benefit because the cancer returned anyway despite receiving chemotherapy. In other words 96 1100 women like her it only side effects and expenses from chemotherapy. They get no benefit. On the other hand for women of 100 like her in a significant benefit, namely the avoidance of distant recurrence.  We then discussed the  possible toxicities side effects and complications of chemotherapy and I have asked her to come to her chemotherapy school to get further informed.  She understands that in [place] we generally recommend chemotherapy for a benefit of 5% or more. She is certainly in the gray zone, and I will be comfortable with her distant decision to forego chemotherapy but also I would be comfortable if she decided to receive chemotherapy. She is going to press about this think  about it and do some more reading and talk to her support group and she will be calling me soon as he has made a decision. Tentatively I have scheduled her to return to see me on March 29. If she decides for chemotherapy we will operationalize that at that point. If she decides against chemotherapy we will cancel that visit and I will see her again once she completes her radiation treatments under Dr. Odis Luster    05/29/2011

## 2011-05-30 ENCOUNTER — Other Ambulatory Visit: Payer: BC Managed Care – PPO

## 2011-05-31 ENCOUNTER — Encounter: Payer: Self-pay | Admitting: *Deleted

## 2011-06-01 ENCOUNTER — Ambulatory Visit
Admission: RE | Admit: 2011-06-01 | Discharge: 2011-06-01 | Disposition: A | Discharge status: Home / Self Care | Payer: BC Managed Care – PPO | Source: Ambulatory Visit | Attending: Radiation Oncology | Admitting: Radiation Oncology

## 2011-06-01 ENCOUNTER — Encounter: Payer: Self-pay | Admitting: Oncology

## 2011-06-01 ENCOUNTER — Encounter: Payer: Self-pay | Admitting: Radiation Oncology

## 2011-06-01 VITALS — BP 126/79 | HR 59 | Temp 99.1°F | Resp 20 | Wt 213.0 lb

## 2011-06-01 DIAGNOSIS — C50419 Malignant neoplasm of upper-outer quadrant of unspecified female breast: Secondary | ICD-10-CM

## 2011-06-01 NOTE — Progress Notes (Signed)
Encounter addended by: Delynn Flavin, RN on: 06/01/2011  6:00 PM<BR>     Documentation filed: Inpatient Patient Education

## 2011-06-01 NOTE — Progress Notes (Signed)
FUNC  Left breast lumpectomy cancer re-excision  On 05/10/11 Benign,no malignancy   Incision well healed, ,   Allergies:Nkda

## 2011-06-01 NOTE — Progress Notes (Signed)
[facility] Radiation Oncology Follow up Note  Name: Briunna Leicht   Date: 06/01/2011    MRN: [id number] DOB: 1965-06-26  CC:  Neldon Labella, MD, MD  Magrinat, Valentino Hue, MD  DIAGNOSIS: [id number]  Grade I ER/PR positive HER-2/neu negative invasive ductal carcinoma of the left breast  NARRATIVE: Ms. Katrinka Blazing returns for followup today. Since I last saw her, she underwent a lumpectomy and sentinel lymph node biopsy on 04/19/2011. Her tumor was 3.2 cm. Her sentinel lymph nodes were negative. The patient had close margins and underwent reexcision on 05/10/2011. No atypical or or malignancy was appreciated in the reexcision specimen. She also underwent postoperative mammogram on 05/05/2011 which showed no suspicious residual calcifications. The patient underwent Oncotype DX testing. She had an intermediate result. Her projected benefit from chemotherapy was rather small and ultimately the patient has decided not to pursue chemotherapy but eventually she will pursue hormonal therapy. She is doing well otherwise and ready to move on with her life, eager to  complete radiation.    ALLERGIES: Review of patient's allergies indicates no known allergies.   MEDICATIONS:  Current Outpatient Prescriptions  Medication Sig Dispense Refill  . cetirizine (ZYRTEC) 10 MG tablet Take 10 mg by mouth as needed.       . diclofenac (VOLTAREN) 0.1 % ophthalmic solution Place 1 drop into both eyes 4 (four) times daily as needed.      . hydrochlorothiazide (MICROZIDE) 12.5 MG capsule daily.       Marland Kitchen ibuprofen (ADVIL,MOTRIN) 200 MG tablet Take 200 mg by mouth every 6 (six) hours as needed.      . phenylephrine (SUDAFED PE) 10 MG TABS Take 10 mg by mouth every 4 (four) hours as needed.      . Loratadine 10 MG CAPS Take by mouth as needed.       . MULTIPLE VITAMIN PO Take 1 tablet by mouth.      . oxyCODONE-acetaminophen (PERCOCET) 5-325 MG per tablet Take 1 tablet by mouth every 4 (four) hours as needed.        . zolpidem (AMBIEN) 5 MG tablet Take 5 mg by mouth at bedtime as needed.           PHYSICAL EXAM:   weight is 213 lb (96.616 kg). Her oral temperature is 99.1 F (37.3 C). Her blood pressure is 126/79 and her pulse is 59. Her respiration is 20.  Breast exam reveals a well-healing lumpectomy scar in the left breast. There are no palpable supraclavicular or axillary lymph nodes nor any palpable lesions of concern in either breast.  LABORATORY DATA:  Lab Results  Component Value Date   WBC 9.7 05/27/2011   HGB 11.3* 05/27/2011   HCT 34.9 05/27/2011   MCV 79.9 05/27/2011   PLT 277 05/27/2011   CMP     Component Value Date/Time   NA 141 05/10/2011 1145   K 3.9 05/10/2011 1145   CL 104 05/10/2011 1145   CO2 31 04/18/2011 0830   GLUCOSE 82 05/10/2011 1145   BUN 8 05/10/2011 1145   CREATININE 0.80 05/10/2011 1145   CALCIUM 9.3 04/18/2011 0830   PROT 7.0 04/05/2011 1331   ALBUMIN 4.4 04/05/2011 1331   AST 20 04/05/2011 1331   ALT 17 04/05/2011 1331   ALKPHOS 17* 04/05/2011 1331   BILITOT 0.5 04/05/2011 1331   GFRNONAA 85* 04/18/2011 0830   GFRAA >90 04/18/2011 0830     RADIOGRAPHIC STUDIES:  As above  Pathology as above  IMPRESSION/PLAN: This is a delightful 46 year old woman pursuing breast conservation therapy. She has decided not to pursue chemotherapy based on her intermediate Oncotype score. I will simulate her this Friday, March 22. I anticipate treating her with a breath-hold technique, if this will be helpful to minimize the dose of radiation to her heart. I plan to treat her breast with opposed tangential fields to a dose of 50 gray in 25 fractions followed by a boost of 10 gray in 5 fractions. It was a pleasure seeing the patient today. We discussed the risks, benefits, and side effects of radiotherapy. No guarantees of treatment were given. A consent form was signed and placed in the patient's medical record. The patient is enthusiastic about proceeding with treatment. I look forward to  participating in the patient's care.

## 2011-06-01 NOTE — Progress Notes (Signed)
Please see the Nurse Progress Note in the MD Initial Consult Encounter for this patient. 

## 2011-06-03 ENCOUNTER — Ambulatory Visit
Admission: RE | Admit: 2011-06-03 | Discharge: 2011-06-03 | Disposition: A | Discharge status: Home / Self Care | Payer: BC Managed Care – PPO | Source: Ambulatory Visit | Attending: Radiation Oncology | Admitting: Radiation Oncology

## 2011-06-03 DIAGNOSIS — C50419 Malignant neoplasm of upper-outer quadrant of unspecified female breast: Secondary | ICD-10-CM

## 2011-06-03 NOTE — Progress Notes (Signed)
Simulation treatment planning note  Diagnosis: left breast cancer T2, N0, M0  The patient is status post lumpectomy. She will receive whole breast radiotherapy. The patient was laid in the supine position on the treatment table with her arms over her head. Her head was in an Accuform device. I placed adhesive wiring over her lumpectomy scar and around the borders of her left breast tissue . High-resolution CT axial imaging was obtained of the patient's chest. An isocenter was placed in her anterior left lung. The same procedure was repeated while the patient held her breath for extended intervals in order to pull her heart from the chest wall. Skin markings were made and she tolerated the procedure well without any complications.  After assessing the images, I decided to use the CT simulation images with the breath-hold technique in order to minimize heart exposure to radiation.  Treatment planning note: the patient will be treated with opposed tangential fields using MLCs for custom blocks. I plan to prescribe 50 gray in 25 fractions to the left breast. This will be followed by a boost to the lumpectomy cavity of 10 gray in 5 fractions.

## 2011-06-03 NOTE — Progress Notes (Signed)
Encounter addended by: Kristee Angus Mintz Fain Francis, RN on: 06/03/2011  5:13 PM<BR>     Documentation filed: Charges VN

## 2011-06-03 NOTE — Progress Notes (Signed)
Met with patient to discuss RO billing.  Rad Tx: [id number] Extrl Beam  Attending Rad: Dr. Basilio Cairo   Dx: 174.4 Upper-outer quadrant of breast

## 2011-06-09 ENCOUNTER — Other Ambulatory Visit: Payer: Self-pay | Admitting: Oncology

## 2011-06-09 ENCOUNTER — Telehealth: Payer: Self-pay | Admitting: *Deleted

## 2011-06-09 NOTE — Telephone Encounter (Signed)
per orders from 06-09-2011 moved patient appointment to 07-27-2011 at 9:00am  patient confirmed over the phone

## 2011-06-09 NOTE — Progress Notes (Signed)
[name] [name] has decided against chemo. This is a reasonable choice and she is comfortable with it. Accordingly she will see me again 5/15 and we will discuss tamoxifen at that point.

## 2011-06-10 ENCOUNTER — Encounter: Payer: Self-pay | Admitting: Radiation Oncology

## 2011-06-10 ENCOUNTER — Ambulatory Visit: Payer: BC Managed Care – PPO | Admitting: Oncology

## 2011-06-10 ENCOUNTER — Ambulatory Visit
Admission: RE | Admit: 2011-06-10 | Discharge: 2011-06-10 | Disposition: A | Discharge status: Home / Self Care | Payer: BC Managed Care – PPO | Source: Ambulatory Visit | Attending: Radiation Oncology | Admitting: Radiation Oncology

## 2011-06-10 NOTE — Progress Notes (Signed)
NARRATIVE: The patient was laid in the correct position on the treatment table for simulation verification. Portal imaging was obtained for her left breast and I verified the fields and MLCs to be accurate. The patient tolerated the procedure well.

## 2011-06-13 ENCOUNTER — Ambulatory Visit
Admission: RE | Admit: 2011-06-13 | Discharge: 2011-06-13 | Disposition: A | Discharge status: Home / Self Care | Payer: BC Managed Care – PPO | Source: Ambulatory Visit | Attending: Radiation Oncology | Admitting: Radiation Oncology

## 2011-06-13 ENCOUNTER — Encounter: Payer: Self-pay | Admitting: Radiation Oncology

## 2011-06-13 VITALS — BP 129/88 | HR 80 | Temp 97.8°F

## 2011-06-13 DIAGNOSIS — C50419 Malignant neoplasm of upper-outer quadrant of unspecified female breast: Secondary | ICD-10-CM

## 2011-06-13 MED ORDER — RADIAPLEXRX EX GEL
Freq: Once | CUTANEOUS | Status: AC
  Administered 2011-06-13: 10:00:00 via TOPICAL

## 2011-06-13 MED ORDER — ALRA NON-METALLIC DEODORANT (RAD-ONC)
1.0000 "application " | Freq: Once | TOPICAL | Status: AC
  Administered 2011-06-13: 1 via TOPICAL

## 2011-06-13 NOTE — Progress Notes (Signed)
Encounter addended by: Delynn Flavin, RN on: 06/13/2011  3:15 PM<BR>     Documentation filed: Notes Section, Inpatient Patient Education

## 2011-06-13 NOTE — Progress Notes (Signed)
   Weekly Management Note, left breast cancer Current Dose:  200 cGy  Projected Dose: 6000 cGy   Narrative:  The patient presents for routine under treatment assessment.  CBCT/MVCT images/Port film x-rays were reviewed.  The chart was checked.  Physical Findings: Weight:  .  Filed Vitals:   06/13/11 0901  BP: 129/88  Pulse: 80  Temp: 97.8 F (36.6 C)   She is doing well with no complaints of far.  Impression:  The patient is tolerating radiotherapy.  Plan:  Continue radiotherapy as planned.

## 2011-06-13 NOTE — Progress Notes (Addendum)
Encounter addended by: Delynn Flavin, RN on: 4/1/2 3:06 pm  First Treatment today.  Pos-sim education performed in regards to management of Skin reactions, pain, and fatigue.  Informed of weekly, Monday,  PUT visit with Dr. Basilio Cairo and informed of the programs related to Breast cancer and other support groups and support staff within the Cancer Center.  encouraged increased protein in diet to facilitate skin repair.  Given biafine as moisturizer and Radiaplex, Non-metallic deodorant.  She stated understanding of sid-effect management.

## 2011-06-14 ENCOUNTER — Ambulatory Visit
Admission: RE | Admit: 2011-06-14 | Discharge: 2011-06-14 | Disposition: A | Discharge status: Home / Self Care | Payer: BC Managed Care – PPO | Source: Ambulatory Visit | Attending: Radiation Oncology | Admitting: Radiation Oncology

## 2011-06-15 ENCOUNTER — Ambulatory Visit
Admission: RE | Admit: 2011-06-15 | Discharge: 2011-06-15 | Disposition: A | Discharge status: Home / Self Care | Payer: BC Managed Care – PPO | Source: Ambulatory Visit | Attending: Radiation Oncology | Admitting: Radiation Oncology

## 2011-06-16 ENCOUNTER — Ambulatory Visit
Admission: RE | Admit: 2011-06-16 | Discharge: 2011-06-16 | Disposition: A | Discharge status: Home / Self Care | Payer: BC Managed Care – PPO | Source: Ambulatory Visit | Attending: Radiation Oncology | Admitting: Radiation Oncology

## 2011-06-17 ENCOUNTER — Ambulatory Visit
Admission: RE | Admit: 2011-06-17 | Discharge: 2011-06-17 | Disposition: A | Discharge status: Home / Self Care | Payer: BC Managed Care – PPO | Source: Ambulatory Visit | Attending: Radiation Oncology | Admitting: Radiation Oncology

## 2011-06-20 ENCOUNTER — Encounter: Payer: Self-pay | Admitting: Radiation Oncology

## 2011-06-20 ENCOUNTER — Ambulatory Visit
Admission: RE | Admit: 2011-06-20 | Discharge: 2011-06-20 | Disposition: A | Discharge status: Home / Self Care | Payer: BC Managed Care – PPO | Source: Ambulatory Visit | Attending: Radiation Oncology | Admitting: Radiation Oncology

## 2011-06-20 DIAGNOSIS — C50419 Malignant neoplasm of upper-outer quadrant of unspecified female breast: Secondary | ICD-10-CM

## 2011-06-20 NOTE — Progress Notes (Signed)
HERE TODAY FOR PUT.  NO C/O . SKIN LOOKS GOOD . TALKS ABOUT BEING EMOTIONAL

## 2011-06-20 NOTE — Progress Notes (Signed)
Encounter addended by: Lonie Peak, MD on: 06/20/2011  3:33 PM<BR>     Documentation filed: Notes Section

## 2011-06-20 NOTE — Progress Notes (Signed)
   Weekly Management Note Current Dose:  1200 cGy  Projected Dose:  6000cGy   Narrative:  The patient presents for routine under treatment assessment.  CBCT/MVCT images/Port film x-rays were reviewed.  The chart was checked. She is doing well. Her energy is good. She fell emotional for one day last week but this resolved.  Physical Findings: Weight:  . Her weight is 213 pounds. She is in no acute distress. She has no skin changes thus far over her left breast.  Impression:  The patient is tolerating radiotherapy.  Plan:  Continue radiotherapy as planned.

## 2011-06-21 ENCOUNTER — Ambulatory Visit
Admission: RE | Admit: 2011-06-21 | Discharge: 2011-06-21 | Disposition: A | Discharge status: Home / Self Care | Payer: BC Managed Care – PPO | Source: Ambulatory Visit | Attending: Radiation Oncology | Admitting: Radiation Oncology

## 2011-06-22 ENCOUNTER — Ambulatory Visit
Admission: RE | Admit: 2011-06-22 | Discharge: 2011-06-22 | Disposition: A | Discharge status: Home / Self Care | Payer: BC Managed Care – PPO | Source: Ambulatory Visit | Attending: Radiation Oncology | Admitting: Radiation Oncology

## 2011-06-23 ENCOUNTER — Ambulatory Visit
Admission: RE | Admit: 2011-06-23 | Discharge: 2011-06-23 | Disposition: A | Discharge status: Home / Self Care | Payer: BC Managed Care – PPO | Source: Ambulatory Visit | Attending: Radiation Oncology | Admitting: Radiation Oncology

## 2011-06-24 ENCOUNTER — Ambulatory Visit
Admission: RE | Admit: 2011-06-24 | Discharge: 2011-06-24 | Disposition: A | Discharge status: Home / Self Care | Payer: BC Managed Care – PPO | Source: Ambulatory Visit | Attending: Radiation Oncology | Admitting: Radiation Oncology

## 2011-06-27 ENCOUNTER — Encounter: Payer: Self-pay | Admitting: Radiation Oncology

## 2011-06-27 ENCOUNTER — Ambulatory Visit
Admission: RE | Admit: 2011-06-27 | Discharge: 2011-06-27 | Disposition: A | Discharge status: Home / Self Care | Payer: BC Managed Care – PPO | Source: Ambulatory Visit | Attending: Radiation Oncology | Admitting: Radiation Oncology

## 2011-06-27 VITALS — Wt 213.4 lb

## 2011-06-27 DIAGNOSIS — C50419 Malignant neoplasm of upper-outer quadrant of unspecified female breast: Secondary | ICD-10-CM

## 2011-06-27 NOTE — Progress Notes (Signed)
Pt states "ever since last week she is experiencing a sharp pain in middle of her right breast daily, usually mid-late morning". It goes away on it's own. She has never experienced this before. She states she has no pain in left breast but has noticed changes in the shape of her breast "almost daily since beginning radiation". She states she also has noticed "white streaks on her skin in left axilla and under her left breast at bra line".  Pt will discuss these concerns w/Dr Basilio Cairo today.  Taking Zyrtec, Sudafed daily for seasonal allergies.

## 2011-06-27 NOTE — Progress Notes (Signed)
   Weekly Management Note Current Dose:  2200 cGy  Projected Dose: 6000cGy   Narrative:  The patient presents for routine under treatment assessment.  CBCT/MVCT images/Port film x-rays were reviewed.  The chart was checked. She has noted shooting pains in the right breast but her lumpectomy was in the left breast in the left breast is where she is receiving radiotherapy. The pains come and go on their own. She has a history of a biopsy to her right breast which was performed at least a year ago. She reports that she has noticed skin changes in the upper chest, low neck region. The skin is red there. She wonders if this is from the radiotherapy. She has also noted some hypopigmentation in the left axilla.  Physical Findings: Weight: 213 lb 6.4 oz (96.798 kg). She is in no acute distress. Her skin is notable for the following: she has erythema in the upper chest/ low neck region. It appears consistent with sun exposure. It is not in the radiation fields. She has early erythema over the left breast. The left lumpectomy cavity has collapsed a bit and is somewhat concave. She has hyperpigmentation in the axilla with overlying hypopigmentation in patches.  Impression:  The patient is tolerating radiotherapy.  Plan:  Continue radiotherapy as planned. I reviewed the patient's plan, and set up photograph. The area of erythema in her low neck/upper chest is not within the radiation fields. Furthermore it was visible on the day of her verification simulation in her photograph which further implies that this is not from her radiotherapy. I talked to the patient about this and gave her reassurance that the radiation is being directed where it is planned. I explained that I think the lumpectomy cavity has collapsed a bit because probably some of the fluid in the cavity has been resorbed by the breast. The hypopigmentation in her axilla is not concerning. We will continue as planned.  I could not say for sure why she  has shooting pains in her right breast, but they may be a delayed reaction to her history of biopsy there. She may discuss more with Dr Ezzard Standing.

## 2011-06-28 ENCOUNTER — Ambulatory Visit
Admission: RE | Admit: 2011-06-28 | Discharge: 2011-06-28 | Disposition: A | Discharge status: Home / Self Care | Payer: BC Managed Care – PPO | Source: Ambulatory Visit | Attending: Radiation Oncology | Admitting: Radiation Oncology

## 2011-06-29 ENCOUNTER — Ambulatory Visit
Admission: RE | Admit: 2011-06-29 | Discharge: 2011-06-29 | Disposition: A | Discharge status: Home / Self Care | Payer: BC Managed Care – PPO | Source: Ambulatory Visit | Attending: Radiation Oncology | Admitting: Radiation Oncology

## 2011-06-30 ENCOUNTER — Ambulatory Visit
Admission: RE | Admit: 2011-06-30 | Discharge: 2011-06-30 | Disposition: A | Discharge status: Home / Self Care | Payer: BC Managed Care – PPO | Source: Ambulatory Visit | Attending: Radiation Oncology | Admitting: Radiation Oncology

## 2011-07-01 ENCOUNTER — Ambulatory Visit
Admission: RE | Admit: 2011-07-01 | Discharge: 2011-07-01 | Disposition: A | Discharge status: Home / Self Care | Payer: BC Managed Care – PPO | Source: Ambulatory Visit | Attending: Radiation Oncology | Admitting: Radiation Oncology

## 2011-07-04 ENCOUNTER — Encounter: Payer: Self-pay | Admitting: Radiation Oncology

## 2011-07-04 ENCOUNTER — Ambulatory Visit
Admission: RE | Admit: 2011-07-04 | Discharge: 2011-07-04 | Disposition: A | Discharge status: Home / Self Care | Payer: BC Managed Care – PPO | Source: Ambulatory Visit | Attending: Radiation Oncology | Admitting: Radiation Oncology

## 2011-07-04 VITALS — BP 134/84 | HR 62 | Resp 20 | Wt 213.7 lb

## 2011-07-04 DIAGNOSIS — C50419 Malignant neoplasm of upper-outer quadrant of unspecified female breast: Secondary | ICD-10-CM

## 2011-07-04 NOTE — Progress Notes (Signed)
   Weekly Management Note left breast cancer Current Dose:  3200 cGy  Projected Dose: 6000 cGy   Narrative:  The patient presents for routine under treatment assessment.  CBCT/MVCT images/Port film x-rays were reviewed.  The chart was checked. She is doing better than last week  She now realizes that the skin irritation is starting to mimic the radiation fields over her breast. The irritation in her low neck and upper chest is stable. She is reassured that the irritation of the low neck and upper chest is not from the radiotherapy.  Physical Findings: Weight: 213 lb 11.2 oz (96.934 kg). She has erythema over her left breast consistent with radiation fields  Impression:  The patient is tolerating radiotherapy.  Plan:  Continue radiotherapy as planned.

## 2011-07-04 NOTE — Progress Notes (Signed)
Pt has no c/o today. Applying Radiaplex to L breast.

## 2011-07-05 ENCOUNTER — Ambulatory Visit
Admission: RE | Admit: 2011-07-05 | Discharge: 2011-07-05 | Disposition: A | Discharge status: Home / Self Care | Payer: BC Managed Care – PPO | Source: Ambulatory Visit | Attending: Radiation Oncology | Admitting: Radiation Oncology

## 2011-07-05 DIAGNOSIS — C50419 Malignant neoplasm of upper-outer quadrant of unspecified female breast: Secondary | ICD-10-CM | POA: Insufficient documentation

## 2011-07-06 ENCOUNTER — Ambulatory Visit
Admission: RE | Admit: 2011-07-06 | Discharge: 2011-07-06 | Disposition: A | Discharge status: Home / Self Care | Payer: BC Managed Care – PPO | Source: Ambulatory Visit | Attending: Radiation Oncology | Admitting: Radiation Oncology

## 2011-07-07 ENCOUNTER — Ambulatory Visit
Admission: RE | Admit: 2011-07-07 | Discharge: 2011-07-07 | Disposition: A | Discharge status: Home / Self Care | Payer: BC Managed Care – PPO | Source: Ambulatory Visit | Attending: Radiation Oncology | Admitting: Radiation Oncology

## 2011-07-08 ENCOUNTER — Ambulatory Visit
Admission: RE | Admit: 2011-07-08 | Discharge: 2011-07-08 | Disposition: A | Discharge status: Home / Self Care | Payer: BC Managed Care – PPO | Source: Ambulatory Visit | Attending: Radiation Oncology | Admitting: Radiation Oncology

## 2011-07-11 ENCOUNTER — Encounter: Payer: Self-pay | Admitting: Radiation Oncology

## 2011-07-11 ENCOUNTER — Ambulatory Visit
Admission: RE | Admit: 2011-07-11 | Discharge: 2011-07-11 | Disposition: A | Discharge status: Home / Self Care | Payer: BC Managed Care – PPO | Source: Ambulatory Visit | Attending: Radiation Oncology | Admitting: Radiation Oncology

## 2011-07-11 ENCOUNTER — Ambulatory Visit: Payer: BC Managed Care – PPO | Admitting: Radiation Oncology

## 2011-07-11 DIAGNOSIS — C50419 Malignant neoplasm of upper-outer quadrant of unspecified female breast: Secondary | ICD-10-CM

## 2011-07-11 NOTE — Progress Notes (Signed)
[employer], operational Note  Diagnosis: Breast Cancer   The patient's CT images from her initial simulation were reviewed to plan her boost treatment to her left breast  lumpectomy cavity.  Measurements were made regarding the size and depth of the surgical bed. The boost to the lumpectomy cavity will be delivered with 15 MeV electrons prescribed to the 95 % isodose line.  A 2 cm margin to block edge will be provided around the lumpectomy cavity. I am prescribing 10 Gray in 5 fractions. A special port plan was reviewed and approved. A custom electron cut-out will be used for her boost field.

## 2011-07-11 NOTE — Progress Notes (Signed)
S/p pt's mammosite tx this morning, pt in nursing for care. Right breast area w/o redness, drainage.  Mammosite cath intact,  area cleansed, Neopsorin applied, redressed per protocol. Pt had no c/o.

## 2011-07-11 NOTE — Progress Notes (Signed)
   Weekly Management Note left breast cancer Current Dose:  4200 cGy  Projected Dose:  6000 cGy   Narrative:  The patient presents for routine under treatment assessment.  CBCT/MVCT images/Port film x-rays were reviewed.  The chart was checked. Her main complaint is that her left nipple is very sore. At the end of the week, it is swollen but then it recovers over the weekend. She is using radiaplex. She needs to limit her ibuprofen use due to her gastric bypass surgery.  Physical Findings: Weight:  . Weight is 211 pounds. This is stable. Her left breast has diffuse erythema. There is no significant desquamation. The nipple is slightly hyperpigmented.  Impression:  The patient is tolerating radiotherapy.  Plan:  Continue radiotherapy as planned. I recommended that the patient use 1% hydrocortisone cream over her nipple as needed for the soreness

## 2011-07-11 NOTE — Progress Notes (Signed)
HERE TODAY FOR PUT OF LEFT BREAST.  SKIN LOOKS GOOD, BRIGHT RED IN COLOR WITH NO DESQUAMATION NOTED.  DOES HAVE SENSITIVITY AT NIPPLE AREA AND SAID IT WAS VERY SWOLLEN THIS WEEK-END.  USING RADIAPLEX.   

## 2011-07-11 NOTE — Progress Notes (Signed)
HERE TODAY FOR PUT OF LEFT BREAST.  SKIN LOOKS GOOD, BRIGHT RED IN COLOR WITH NO DESQUAMATION NOTED.  DOES HAVE SENSITIVITY AT NIPPLE AREA AND SAID IT WAS VERY SWOLLEN THIS WEEK-END.  USING RADIAPLEX.

## 2011-07-12 ENCOUNTER — Ambulatory Visit
Admission: RE | Admit: 2011-07-12 | Discharge: 2011-07-12 | Disposition: A | Discharge status: Home / Self Care | Payer: BC Managed Care – PPO | Source: Ambulatory Visit | Attending: Radiation Oncology | Admitting: Radiation Oncology

## 2011-07-13 ENCOUNTER — Ambulatory Visit
Admission: RE | Admit: 2011-07-13 | Discharge: 2011-07-13 | Disposition: A | Discharge status: Home / Self Care | Payer: BC Managed Care – PPO | Source: Ambulatory Visit | Attending: Radiation Oncology | Admitting: Radiation Oncology

## 2011-07-13 ENCOUNTER — Ambulatory Visit: Payer: BC Managed Care – PPO | Admitting: Radiation Oncology

## 2011-07-14 ENCOUNTER — Ambulatory Visit
Admission: RE | Admit: 2011-07-14 | Discharge: 2011-07-14 | Disposition: A | Discharge status: Home / Self Care | Payer: BC Managed Care – PPO | Source: Ambulatory Visit | Attending: Radiation Oncology | Admitting: Radiation Oncology

## 2011-07-15 ENCOUNTER — Ambulatory Visit
Admission: RE | Admit: 2011-07-15 | Discharge: 2011-07-15 | Disposition: A | Discharge status: Home / Self Care | Payer: BC Managed Care – PPO | Source: Ambulatory Visit | Attending: Radiation Oncology | Admitting: Radiation Oncology

## 2011-07-18 ENCOUNTER — Ambulatory Visit
Admission: RE | Admit: 2011-07-18 | Discharge: 2011-07-18 | Disposition: A | Discharge status: Home / Self Care | Payer: BC Managed Care – PPO | Source: Ambulatory Visit | Attending: Radiation Oncology | Admitting: Radiation Oncology

## 2011-07-18 ENCOUNTER — Encounter: Payer: Self-pay | Admitting: Radiation Oncology

## 2011-07-18 VITALS — Wt 213.6 lb

## 2011-07-18 DIAGNOSIS — C50419 Malignant neoplasm of upper-outer quadrant of unspecified female breast: Secondary | ICD-10-CM

## 2011-07-18 MED ORDER — RADIAPLEXRX EX GEL
Freq: Once | CUTANEOUS | Status: AC
  Administered 2011-07-18: 14:00:00 via TOPICAL

## 2011-07-18 NOTE — Progress Notes (Signed)
   Weekly Management Note, L breast cancer Current Dose:  5200 cGy  Projected Dose: 6000 cGy   Narrative:  The patient presents for routine under treatment assessment.  CBCT/MVCT images/Port film x-rays were reviewed.  The chart was checked. She reports soreness in the left axilla and over the left nipple. Left axilla is the most irritated.  Physical Findings: Weight: 213 lb 9.6 oz (96.888 kg). She has hyperpigmentation throughout the left breast particularly in the left axilla in the inframammary fold. She has some early dry desquamation in patches.  Impression:  The patient is tolerating radiotherapy.  Plan:  Continue radiotherapy as planned. We gave her hydrogel pads to use in the axillary region. She started her boost treatment today. One month followup after completion of radiotherapy. She also has followup with medical oncology in the near future, I printed an after visit summary for her.

## 2011-07-18 NOTE — Progress Notes (Signed)
Pt c/o "burning, sensitivity, irritation" in left axilla. States Radiaplex gives good relief. Places ice pack in axilla w/cloth between ice pack and skin. Gave pt Hydrogel pad to try in axilla. Warned against using items of extreme temps. Gave another tube of Radiaplex.

## 2011-07-18 NOTE — Progress Notes (Signed)
Encounter addended by: Glennie Hawk, RN on: 07/18/2011  1:30 PM<BR>     Documentation filed: Inpatient MAR, Orders

## 2011-07-19 ENCOUNTER — Ambulatory Visit
Admission: RE | Admit: 2011-07-19 | Discharge: 2011-07-19 | Disposition: A | Discharge status: Home / Self Care | Payer: BC Managed Care – PPO | Source: Ambulatory Visit | Attending: Radiation Oncology | Admitting: Radiation Oncology

## 2011-07-20 ENCOUNTER — Ambulatory Visit
Admission: RE | Admit: 2011-07-20 | Discharge: 2011-07-20 | Disposition: A | Discharge status: Home / Self Care | Payer: BC Managed Care – PPO | Source: Ambulatory Visit | Attending: Radiation Oncology | Admitting: Radiation Oncology

## 2011-07-21 ENCOUNTER — Ambulatory Visit
Admission: RE | Admit: 2011-07-21 | Discharge: 2011-07-21 | Disposition: A | Discharge status: Home / Self Care | Payer: BC Managed Care – PPO | Source: Ambulatory Visit | Attending: Radiation Oncology | Admitting: Radiation Oncology

## 2011-07-22 ENCOUNTER — Encounter: Payer: Self-pay | Admitting: Radiation Oncology

## 2011-07-22 ENCOUNTER — Ambulatory Visit
Admission: RE | Admit: 2011-07-22 | Discharge: 2011-07-22 | Disposition: A | Discharge status: Home / Self Care | Payer: BC Managed Care – PPO | Source: Ambulatory Visit | Attending: Radiation Oncology | Admitting: Radiation Oncology

## 2011-07-22 ENCOUNTER — Ambulatory Visit: Payer: BC Managed Care – PPO

## 2011-07-22 VITALS — BP 122/84 | HR 64 | Wt 208.1 lb

## 2011-07-22 DIAGNOSIS — C50419 Malignant neoplasm of upper-outer quadrant of unspecified female breast: Secondary | ICD-10-CM

## 2011-07-22 NOTE — Progress Notes (Signed)
   Weekly Management Note, left breast cancer Current Dose:   6000cGy  Projected Dose:  6000cGy   Narrative:  The patient presents for routine under treatment assessment.  CBCT/MVCT images/Port film x-rays were reviewed.  The chart was checked. The skin in her axilla is feeling much better. The hydrogel pads helped. She is noticing signs of healing. Still using radiaplex  Physical Findings: Weight: 208 lb 1.6 oz (94.394 kg). She has diffuse hyperpigmentation and erythema throughout the left breast. Hyperpigmentation is most notable in the left axilla. There is dry desquamation but no moist desquamation.  Impression:  The patient  has tolerated radiotherapy   Plan:  Routine followup in one month. She has a followup next week with medical oncology to initiate antiestrogen therapy.

## 2011-07-22 NOTE — Progress Notes (Signed)
Completes today.  Dry desquamation noted in left inframmary fold and left axilla.  Ms [name] [name] states that the majority of her pain during tx. Was in her left axilla, but this has subsided now and she grades the discomfort in this area as a level 3 on a scale of 0-10.    Explained that more peeling of the hyperpigmented skin may occur during the next week or two and that this is normal.  Encouraged to call for any concerns.

## 2011-07-25 ENCOUNTER — Ambulatory Visit: Payer: BC Managed Care – PPO

## 2011-07-26 ENCOUNTER — Ambulatory Visit: Payer: BC Managed Care – PPO

## 2011-07-26 NOTE — Progress Notes (Incomplete)
  Radiation Oncology         [phone]) [phone] ________________________________  Name: [name] [name] MRN: [id number]  Date: 07/22/2011  DOB: January 14, 1966  End of Treatment Note  Diagnosis:  pT2N0M0 Grade I ER/PR positive HER-2/neu negative invasive ductal carcinoma of the left breast   Indication for treatment:  ***       Radiation treatment dates:   06/13/2011-07/22/2011  Site/dose:   ***  Beams/energy:   ***  Narrative: The patient tolerated radiation treatment relatively well.   ***  Plan: The patient has completed radiation treatment. The patient will return to radiation oncology clinic for routine followup in one month. I advised them to call or return sooner if they have any questions or concerns related to their recovery or treatment.  -----------------------------------  Lonie Peak, MD

## 2011-07-27 ENCOUNTER — Ambulatory Visit (HOSPITAL_BASED_OUTPATIENT_CLINIC_OR_DEPARTMENT_OTHER): Payer: BC Managed Care – PPO | Admitting: Oncology

## 2011-07-27 ENCOUNTER — Telehealth: Payer: Self-pay | Admitting: Oncology

## 2011-07-27 ENCOUNTER — Ambulatory Visit: Payer: BC Managed Care – PPO

## 2011-07-27 VITALS — BP 135/87 | HR 60 | Temp 98.4°F | Ht 67.0 in | Wt 212.9 lb

## 2011-07-27 DIAGNOSIS — C50419 Malignant neoplasm of upper-outer quadrant of unspecified female breast: Secondary | ICD-10-CM

## 2011-07-27 DIAGNOSIS — Z170 Estrogen receptor positive status [ER+]: Secondary | ICD-10-CM

## 2011-07-27 MED ORDER — TAMOXIFEN CITRATE 20 MG PO TABS: 20.0000 mg | ORAL_TABLET | Freq: Every day | ORAL | Status: AC

## 2011-07-27 NOTE — Telephone Encounter (Signed)
gve the pt her aug 2013 appt calendar along with the appt to see dr Kelly Splinter the plastic surgeon in aug

## 2011-07-27 NOTE — Progress Notes (Signed)
ID: [name] [name]   DOB: 31-May-1965  MR#: [id number]  JXB#:[id number]  HISTORY OF PRESENT ILLNESS: [name] [name] is a 46 year old [place] woman who underwent a yearly screening mammography in 02/11/2011. This showed heterogeneously dense tissue and a possible mass in the left breast. Additional views 03/21/2011 showed an area of amorphous calcifications in the upper central aspect of the left breast, measuring up to 3.0 cm. There was no associated mass. Biopsy was performed the same day and showed ([id number]) invasive ductal breast cancer, grade 1-2, with ductal carcinoma in situ, grade 1-3. The invasive tumor was 57% estrogen receptor positive, 97% progesterone receptor positive, with an MIB-1 of 10% and no HER-2 amplification.  With this information the patient proceeded to bilateral breast MRIs 03/31/2011 this showed a round enhancing mass with irregular margins measuring 6 mm, with an associated biopsy clip. There was additional clumped and linear enhancement of compatible with DCIS the total area measuring 3.1 cm. There were no other suspicious masses in either breast and there was no axillary or internal mammary adenopathy noted  INTERVAL HISTORY: [name] returns today after completing her radiation. She didn't miss any work time, continued to take care of the kids in the family, and to exercise by walking and with a personal trainer once a week.  REVIEW OF SYSTEMS: She had some dry desquamation, which is resolving she tells me. She had minimal fatigue. She's been having some sinus symptoms which are not uncommon for her this season. She takes Zyrtec and Sudafed daily. She's been having mild headaches associated with this. She keeps a dry cough as well. Otherwise a detailed review of systems today was noncontributory  PAST MEDICAL HISTORY: Past Medical History  Diagnosis Date  . Status post biopsy 03/21/11    Breast, Left, Needle Core Biopsy, Upper Central - DCIS, Grade I-III with comedo Necrosis  and Calcifications. ER+, PR+, Ki-67 10%, Her2- No Amplification  . Arthritis     Osteoarthritis Neck and Back  . Cancer     Left breast cancer  . Anemia     not at present  She had a right breast biopsy remotely, which was benign. She has mild degenerative disc disease involving the cervical and lumbar spines. She status post lap band surgery November of 2010, and has lost 92 pounds so far. She has a history of seasonal allergies, hypertension which is improving eyes she continues to lose weight, history of postpartum migraines, resolved, history of seizure, remote, and is status post C-section x2 and dilatation and curettage times one   PAST SURGICAL HISTORY: Past Surgical History  Procedure Date  . Laparoscopic gastric banding 01/27/2009  . Cesarean section 1999, 2005  . Biopsy breast 08/07/09    Breast, Right Needle core Biopsy, UOQ - Pseudoangiomatous Stromal hyperplasia  . Breast biopsy 03/21/11  . Breast lumpectomy 04/19/11    snbx  . Breast lumpectomy 05/10/11    Left Breast Lumpectomy re-excision    FAMILY HISTORY Family History  Problem Relation Age of Onset  . Stroke Mother     Stroke - mini  . Cancer Father     Lymphoma - passed age 69  The patient's father died at the age of 86 from sepsis in the setting of lymphoma. The patient's mother died at the age of 23 after a heart attack in the setting of multiple strokes. The patient had no brothers. She has 3 sisters. There is no history of breast or ovarian cancer in the family  GYNECOLOGIC HISTORY: Menarche age  9, first pregnancy to term age 69. She is GX P2.  Her periods are regular, usually last for 5 days, only one of which is heavy. 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 Her husband then is an Art gallery manager. They are separated but not divorced. The patient has 51 and 37-year-old sons at home, with shared custody. She understands that in the absence of a  formal divorced her husband will have healthcare power of attorney in case of a disabling event. She says this is not a problem for her. She attends [employer] locally    ADVANCED DIRECTIVES: not in place  HEALTH MAINTENANCE: History  Substance Use Topics  . Smoking status: Never Smoker   . Smokeless tobacco: Never Used  . Alcohol Use: Yes     rarely     Colonoscopy:  PAP:  Bone density:  Lipid panel:  No Known Allergies  Current Outpatient Prescriptions  Medication Sig Dispense Refill  . hydrochlorothiazide (MICROZIDE) 12.5 MG capsule daily.       Marland Kitchen ibuprofen (ADVIL,MOTRIN) 200 MG tablet Take 200 mg by mouth every 6 (six) hours as needed.      . MULTIPLE VITAMIN PO Take 1 tablet by mouth.      . phenylephrine (SUDAFED PE) 10 MG TABS Take 10 mg by mouth every 4 (four) hours as needed.      . cetirizine (ZYRTEC) 10 MG tablet Take 10 mg by mouth as needed.       . diclofenac (VOLTAREN) 0.1 % ophthalmic solution Place 1 drop into both eyes 4 (four) times daily as needed.      . Loratadine 10 MG CAPS Take by mouth as needed.       . zolpidem (AMBIEN) 5 MG tablet Take 5 mg by mouth at bedtime as needed.        OBJECTIVE: Middle-aged white woman in no acute distress Filed Vitals:   07/27/11 0908  BP: 135/87  Pulse: 60  Temp: 98.4 F (36.9 C)     Body mass index is 33.34 kg/(m^2).    ECOG FS: 0  Sclerae unicteric Oropharynx clear No peripheral adenopathy Lungs no rales or rhonchi Heart regular rate and rhythm Abd benign MSK no focal spinal tenderness, no peripheral edema Neuro: nonfocal Breasts: Right breast no suspicious findings; left breast status post radiation. There is minimal dry desquamation in the left axilla. There is hyperpigmentation and some skin edema. There is no evidence of local recurrence  LAB RESULTS: Lab Results  Component Value Date   WBC 9.7 05/27/2011   NEUTROABS 6.5 05/27/2011   HGB 11.3* 05/27/2011   HCT 34.9 05/27/2011   MCV 79.9  05/27/2011   PLT 277 05/27/2011      Chemistry      Component Value Date/Time   NA 141 05/10/2011 1145   K 3.9 05/10/2011 1145   CL 104 05/10/2011 1145   CO2 31 04/18/2011 0830   BUN 8 05/10/2011 1145   CREATININE 0.80 05/10/2011 1145      Component Value Date/Time   CALCIUM 9.3 04/18/2011 0830   ALKPHOS 17* 04/05/2011 1331   AST 20 04/05/2011 1331   ALT 17 04/05/2011 1331   BILITOT 0.5 04/05/2011 1331       Lab Results  Component Value Date   LABCA2 23 04/05/2011    No components found with this basename: [id number]    No results found for this basename: INR:1;PROTIME:1  in the last 168 hours  Urinalysis No results found for this basename: colorurine,  appearanceur,  labspec,  phurine,  glucoseu,  hgbur,  bilirubinur,  ketonesur,  proteinur,  urobilinogen,  nitrite,  leukocytesur   STUDIES: Mm Digital Diagnostic Unilat L  05/05/2011  *RADIOLOGY REPORT*  Clinical Data:  The patient underwent left lumpectomy for breast cancer on 04/19/2011.  A close margin was identified.  Left mammogram is requested to evaluate for residual calcifications prior to reexcision.  DIGITAL DIAGNOSTIC LEFT MAMMOGRAM WITH CAD  Comparison:  03/21/2011, 02/11/2011, 02/08/2010  Findings:  As the patient is recently postop, the examination was tailored to cause as little pain as possible.  We obtained CC and magnification CC views as well as an ML magnification view of the upper portion of the left breast.  Post lumpectomy changes are present.  Surgical clips are in the lumpectomy bed.   No suspicious residual calcifications are identified however one must note that compression is somewhat limited and postoperative change may obscure very fine calcifications. Mammographic images were processed with CAD.  IMPRESSION: No suspicious residual calcifications are identified given the limitations of limited compression and postoperative change.  BI-RADS CATEGORY 2:  Benign finding(s).  Original Report Authenticated By: Daryl Eastern, M.D.    ASSESSMENT: 46 year old [place] woman status post Left lumpectomy and sentinel lymph node sampling 04/19/2011 for a T2 N0, stage IIA invasive ductal carcinoma, grade 1 estrogen and progesterone receptor positive at 57% and 97% respectively, HER-2 negative, with an MIB-1 of 10. Her Oncotype Dx predicts a distant recurrence rate of 13% if her only systemic treatment is tamoxifen for 5 years.  (1) completer radiation 07/22/2011  (2) starting tamoxifen now   PLAN:  We again went over the chemotherapy decision and both she and I are comfortable with foregoing that treatment modality, given that the anticipated benefit would be about 3% (97/100 women like her would get no benefit). She on the other hand understands the significant benefits expected from anti-estrogens namely cutting in half the risk of recurrence and cutting in half the risk of a new breast cancer developing.  Today we discussed the possible toxicities side effects and complications of tamoxifen. She understands this may interrupt her periods. Recall she is status post bilateral tubal ligation, so contraception is not an issue. Monitoring for endometrial cancer it is not standardized, but if she does stop menstruating it would not be unreasonable to consider an ultrasound once a year perhaps. I am leaving that to her gynecologist, Dr. Berenda Morale discretion.  Christen Bame will return to see me in 3 months. If everything is going well at that time of I will start seeing her on an every 6 month basis. At this point we anticipate staying on tamoxifen for 10 years or potentially switching earlier to an aromatase inhibitor depending on her menopausal status. She knows to call for any problems that may develop before the next visit     Zaylen Susman C    07/27/2011

## 2011-07-29 NOTE — Progress Notes (Signed)
Radiation Oncology [phone]) [phone]  ________________________________   Name: [name] [name] MRN: [id number]   Date: 07/22/2011 DOB: 10-01-1965   End of Treatment Note   Diagnosis: pT2N0M0 Grade I ER/PR positive HER-2/neu negative invasive ductal carcinoma of the left breast   Indication for treatment: curative  Radiation treatment dates: 06/13/2011-07/22/2011   Site/dose:  Left Breast / 50 Gy in 25 fractions. Left Breast boost / 10 Gy in 5 fractions.  Beams/energy:  Tangents forward planned / 6 and 10 MV photons . Electron boost / 15 MeV electrons.  Narrative: The patient tolerated radiation treatment well. She developed diffuse hyperpigmentation and erythema throughout the left breast. Hyperpigmentation was most notable in the left axilla. There was dry desquamation but no moist desquamation.  Plan: The patient has completed radiation treatment. The patient will return to radiation oncology clinic for routine followup in one month. I advised her to call or return sooner with any questions or concerns related to her recovery or treatment. She has a followup next week with medical oncology to initiate antiestrogen therapy.     -----------------------------------  Lonie Peak, MD

## 2011-08-04 ENCOUNTER — Ambulatory Visit (INDEPENDENT_AMBULATORY_CARE_PROVIDER_SITE_OTHER): Payer: BC Managed Care – PPO | Admitting: Physician Assistant

## 2011-08-04 ENCOUNTER — Encounter (INDEPENDENT_AMBULATORY_CARE_PROVIDER_SITE_OTHER): Payer: Self-pay | Admitting: Physician Assistant

## 2011-08-04 VITALS — BP 120/78 | HR 68 | Temp 97.4°F | Resp 14 | Ht 67.0 in | Wt 212.4 lb

## 2011-08-04 DIAGNOSIS — Z4651 Encounter for fitting and adjustment of gastric lap band: Secondary | ICD-10-CM

## 2011-08-04 NOTE — Patient Instructions (Signed)
Take prilosec 20mg  tablets once daily for two weeks. Return in six months, or sooner if needed.

## 2011-08-04 NOTE — Progress Notes (Signed)
  HISTORY: [name] [name] is a 46 y.o.female who received an AP-Standard lap-band in November 2010 by Dr. Ezzard Standing. She comes in with a two to three week history of increasing solid food dysphagia, occurring about 3-4 times a week. Occurences are unpredictable. Clear liquids are generally okay. She has complained of sinus drainage due to allergies. She has also completed breast radiation therapy.  VITAL SIGNS: Filed Vitals:   08/04/11 0919  BP: 120/78  Pulse: 68  Temp: 97.4 F (36.3 C)  Resp: 14    PHYSICAL EXAM: Physical exam reveals a very well-appearing 46 y.o.female in no apparent distress Neurologic: Awake, alert, oriented Psych: Bright affect, conversant Respiratory: Breathing even and unlabored. No stridor or wheezing Abdomen: Soft, nontender, nondistended to palpation. Incisions well-healed. No incisional hernias. Port easily palpated. Extremities: Atraumatic, good range of motion.  ASSESMENT: 46 y.o.  female  s/p AP-Standard lap-band.   PLAN: The patient's port was accessed with a 20G Huber needle without difficulty. Clear fluid was aspirated and 0.5 mL saline was removed from the port to give a total predicted volume of 4.5 mL. I requested she take prilosec for the next couple of weeks then return to see [place] in six months or so.

## 2011-08-12 ENCOUNTER — Telehealth: Payer: Self-pay | Admitting: Oncology

## 2011-08-12 NOTE — Telephone Encounter (Signed)
S/w the pt and she is aware of her r/s aug 15th appt to 11/01/2011 due to the md's schedule

## 2011-08-19 ENCOUNTER — Encounter: Payer: Self-pay | Admitting: Radiation Oncology

## 2011-08-19 ENCOUNTER — Ambulatory Visit
Admission: RE | Admit: 2011-08-19 | Discharge: 2011-08-19 | Disposition: A | Discharge status: Home / Self Care | Payer: BC Managed Care – PPO | Source: Ambulatory Visit | Attending: Radiation Oncology | Admitting: Radiation Oncology

## 2011-08-19 VITALS — BP 120/74 | HR 60 | Temp 98.0°F | Wt 211.5 lb

## 2011-08-19 DIAGNOSIS — C50419 Malignant neoplasm of upper-outer quadrant of unspecified female breast: Secondary | ICD-10-CM

## 2011-08-19 HISTORY — DX: Personal history of other diseases of the nervous system and sense organs: Z86.69

## 2011-08-19 HISTORY — DX: Personal history of irradiation: Z92.3

## 2011-08-19 HISTORY — DX: Unspecified osteoarthritis, unspecified site: M19.90

## 2011-08-19 NOTE — Progress Notes (Signed)
Radiation Oncology         [phone]) [phone] ________________________________  Name: [name] [name] MRN: [id number]  Date: 08/19/2011  DOB: [birth year]  Follow-Up Visit Note  CC: Neldon Labella, MD, MD  Kandis Cocking, MD  Diagnosis:  Pathologic T2, N0, M0 left breast cancer, ER positive  Interval Since Last Radiation:  Approximately one month  Narrative:  The patient returns today for routine follow-up. She is doing well. She does report continued shooting pains in her left breast. She's not pleased with her cosmetic outcome and asymmetry between her 2 breasts. She did require quite a bit tissue to be removed for her lumpectomy. She is going to meet with a plastic surgeon in about 2 months. She is taking tamoxifen under the care of Dr. Darnelle Catalan and tolerating that well with no complaints. My nurse has noticed a little bit of swelling in the patient's left arm. She denies any pain there  ALLERGIES:   has no known allergies.  Meds: Current Outpatient Prescriptions  Medication Sig Dispense Refill  . cetirizine (ZYRTEC) 10 MG tablet Take 10 mg by mouth as needed.       . diclofenac (VOLTAREN) 0.1 % ophthalmic solution Place 1 drop into both eyes 4 (four) times daily as needed.      . hydrochlorothiazide (MICROZIDE) 12.5 MG capsule daily.       Marland Kitchen ibuprofen (ADVIL,MOTRIN) 200 MG tablet Take 200 mg by mouth every 6 (six) hours as needed.      . Loratadine 10 MG CAPS Take by mouth as needed.       . phenylephrine (SUDAFED PE) 10 MG TABS Take 10 mg by mouth every 4 (four) hours as needed.      . tamoxifen (NOLVADEX) 20 MG tablet Take 1 tablet (20 mg total) by mouth daily.  90 tablet  12  . MULTIPLE VITAMIN PO Take 1 tablet by mouth.        Physical Findings: The patient is in no acute distress. Patient is alert and oriented.  weight is 211 lb 8 oz (95.936 kg). Her temperature is 98 F (36.7 C). Her blood pressure is 120/74 and her pulse is 60. Marland Kitchen   She is in no acute distress. She has very  mild lymphedema in the biceps triceps region and the forearm of the left upper extremity. No palpable tenderness in the arm. No palpable cords.    Her breast demonstrates a residual hyperpigmentation. The left breast is significantly smaller than the right. There is some concavity underneath the lumpectomy scar.  Lab Findings: Lab Results  Component Value Date   WBC 9.7 05/27/2011   HGB 11.3* 05/27/2011   HCT 34.9 05/27/2011   MCV 79.9 05/27/2011   PLT 277 05/27/2011    CMP     Component Value Date/Time   NA 141 05/10/2011 1145   K 3.9 05/10/2011 1145   CL 104 05/10/2011 1145   CO2 31 04/18/2011 0830   GLUCOSE 82 05/10/2011 1145   BUN 8 05/10/2011 1145   CREATININE 0.80 05/10/2011 1145   CALCIUM 9.3 04/18/2011 0830   PROT 7.0 04/05/2011 1331   ALBUMIN 4.4 04/05/2011 1331   AST 20 04/05/2011 1331   ALT 17 04/05/2011 1331   ALKPHOS 17* 04/05/2011 1331   BILITOT 0.5 04/05/2011 1331   GFRNONAA 85* 04/18/2011 0830   GFRAA >90 04/18/2011 0830      Radiographic Findings: No results found.  Impression:  The patient is recovering from the effects of radiation  Plan: Patient will followup with plastic surgery as scheduled, in light of her suboptimal cosmetic outcome. I told her that her breast is still healing but it is clear that she has significant asymmetry between her breasts and consultation with plastic surgeon in a couple months is reasonable.   I will order a physical therapy referral as the patient has what appears consistent with early lymphedema. I told the patient that this is most likely lymphedema and very unlikely to be due to a blood clot in light of her recently started tamoxifen. I did offer a Doppler ultrasound that can be done today but told her it would also be reasonable to hold any ultrasounds unless her lymphedema progresses significantly. She is comfortable with proceeding with physical therapy alone for now. She'll continue to be followed by medical oncology. She has my contact  information and knows that she can be worked into my clinic at any time. Otherwise we'll see her back on a when necessary basis.   ________________________________  Lonie Peak, MD

## 2011-08-19 NOTE — Progress Notes (Signed)
FU today.  Continues to have Edema of the left Areola region.  Note change in size of Right arm near the antecubital area and laterally but denies any pain.  C/o tenderness in breast.  Taking Tamioxifen and denies any fatigue, aching of joints or N/V

## 2011-08-31 ENCOUNTER — Ambulatory Visit: Payer: BC Managed Care – PPO | Attending: Radiation Oncology | Admitting: Physical Therapy

## 2011-08-31 DIAGNOSIS — IMO0001 Reserved for inherently not codable concepts without codable children: Secondary | ICD-10-CM | POA: Insufficient documentation

## 2011-08-31 DIAGNOSIS — I890 Lymphedema, not elsewhere classified: Secondary | ICD-10-CM | POA: Insufficient documentation

## 2011-10-27 ENCOUNTER — Other Ambulatory Visit: Payer: BC Managed Care – PPO | Admitting: Lab

## 2011-10-27 ENCOUNTER — Ambulatory Visit: Payer: BC Managed Care – PPO | Admitting: Oncology

## 2011-11-01 ENCOUNTER — Telehealth: Payer: Self-pay | Admitting: Oncology

## 2011-11-01 ENCOUNTER — Other Ambulatory Visit (HOSPITAL_BASED_OUTPATIENT_CLINIC_OR_DEPARTMENT_OTHER): Payer: BC Managed Care – PPO | Admitting: Lab

## 2011-11-01 ENCOUNTER — Ambulatory Visit (HOSPITAL_BASED_OUTPATIENT_CLINIC_OR_DEPARTMENT_OTHER): Payer: BC Managed Care – PPO | Admitting: Oncology

## 2011-11-01 VITALS — BP 133/86 | HR 65 | Temp 99.0°F | Resp 20 | Ht 67.0 in | Wt 219.6 lb

## 2011-11-01 DIAGNOSIS — C50419 Malignant neoplasm of upper-outer quadrant of unspecified female breast: Secondary | ICD-10-CM

## 2011-11-01 DIAGNOSIS — R21 Rash and other nonspecific skin eruption: Secondary | ICD-10-CM

## 2011-11-01 DIAGNOSIS — Z171 Estrogen receptor negative status [ER-]: Secondary | ICD-10-CM

## 2011-11-01 LAB — CBC WITH DIFFERENTIAL/PLATELET
BASO%: 0.7 % (ref 0.0–2.0)
MCHC: 33.9 g/dL (ref 31.5–36.0)
MONO#: 0.4 10*3/uL (ref 0.1–0.9)
RBC: 4.32 10*6/uL (ref 3.70–5.45)
RDW: 14.6 % — ABNORMAL HIGH (ref 11.2–14.5)
WBC: 6 10*3/uL (ref 3.9–10.3)
lymph#: 1.8 10*3/uL (ref 0.9–3.3)
nRBC: 0 % (ref 0–0)

## 2011-11-01 NOTE — Telephone Encounter (Signed)
gve the pt her feb 2014 appt calendar °

## 2011-11-01 NOTE — Progress Notes (Signed)
ID: [name] [name]   DOB: Sep 23, 1965  MR#: [id number]  WUJ#:[id number]  HISTORY OF PRESENT ILLNESS: [name] [name] is a 46 year old [place] woman who underwent a yearly screening mammography in 02/11/2011. This showed heterogeneously dense tissue and a possible mass in the left breast. Additional views 03/21/2011 showed an area of amorphous calcifications in the upper central aspect of the left breast, measuring up to 3.0 cm. There was no associated mass. Biopsy was performed the same day and showed ([id number]) invasive ductal breast cancer, grade 1-2, with ductal carcinoma in situ, grade 1-3. The invasive tumor was 57% estrogen receptor positive, 97% progesterone receptor positive, with an MIB-1 of 10% and no HER-2 amplification.  With this information the patient proceeded to bilateral breast MRIs 03/31/2011 this showed a round enhancing mass with irregular margins measuring 6 mm, with an associated biopsy clip. There was additional clumped and linear enhancement of compatible with DCIS the total area measuring 3.1 cm. There were no other suspicious masses in either breast and there was no axillary or internal mammary adenopathy noted. Her subsequent history is as detailed below.  INTERVAL HISTORY: [name] [name] returns today after starting tamoxifen in May of this year. The interval history is significant for her continuing to work through her separation and planned divorced. In the meantime she has met a significant other and she is considering remarried sometime next year, after her divorce is finalized.  REVIEW OF SYSTEMS: She was at a late doing some rowing and other outdoor activities of when overnight one night she felt significant pain in her left shoulder. She describes the pain is intense in the left arm is so weak she had to use her right arm to lift it. She took nonsteroidals for that, and the discomfort lasted about 2 or 3 days then resolved. About a week later or perhaps 10 days later she had a  similar episode involving her right elbow. Again there was no trauma involved, no obvious insect bite, or other probe location. That episode of joint inflammation also lasted a few days, and was controlled on nonsteroidals finally she had a "spotty red rash" over her knees area, where she had some sun. In all this she had no fever, fatigue, or other systemic symptoms. She is having some hot flashes which are likely due to the tamoxifen. (She attributed her other symptoms to tamoxifen as well, although I do not think that is the likely cause). She continues to have periods, although the periods are now every 28 days instead of every 3 weeks and less 2 or 3 days and are scan. A detailed review of systems was otherwise noncontributory  PAST MEDICAL HISTORY: Past Medical History  Diagnosis Date  . Status post biopsy 03/21/11    Breast, Left, Needle Core Biopsy, Upper Central - DCIS, Grade I-III with comedo Necrosis and Calcifications. ER+, PR+, Ki-67 10%, Her2- No Amplification  . Arthritis     Osteoarthritis Neck and Back  . Cancer     Left breast cancer  . Anemia     not at present  . S/P radiation therapy 06/13/2011 - 07/22/11    Left Breast/ 50 Gy in 25 Fractions: Left Breast Boost Boost/ 10 Gy in 5 Fractions  . DJD (degenerative joint disease)     Cervical and lumbar spin  . Hx of seizure disorder     Remote hx  She had a right breast biopsy remotely, which was benign. She has mild degenerative disc disease involving the cervical and lumbar spines.  She status post lap band surgery November of 2010, and has lost 92 pounds so far. She has a history of seasonal allergies, hypertension which is improving eyes she continues to lose weight, history of postpartum migraines, resolved, history of seizure, remote, and is status post C-section x2 and dilatation and curettage times one   PAST SURGICAL HISTORY: Past Surgical History  Procedure Date  . Laparoscopic gastric banding 01/27/2009  . Cesarean  section 1999, 2005  . Biopsy breast 08/07/09    Breast, Right Needle core Biopsy, UOQ - Pseudoangiomatous Stromal hyperplasia  . Breast biopsy 03/21/11  . Breast lumpectomy 04/19/11    snbx  . Breast lumpectomy 05/10/11    Left Breast Lumpectomy re-excision  . Dilation and curettage of uterus     X 1    FAMILY HISTORY Family History  Problem Relation Age of Onset  . Stroke Mother     Stroke - mini  . Cancer Father     Lymphoma - passed age 32  The patient's father died at the age of 14 from sepsis in the setting of lymphoma. The patient's mother died at the age of 62 after a heart attack in the setting of multiple strokes. The patient had no brothers. She has 3 sisters. There is no history of breast or ovarian cancer in the family  GYNECOLOGIC HISTORY: Menarche age 51, first pregnancy to term age 29. She is GX P2. As noted above, she still having periods through tamoxifen, although they are slightly less frequent and briefer. 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 She is in process of separation, with a divorce pending. The patient has 8 and 61-year-old sons at home, with shared custody. She understands that in the absence of a formal divorced her husband will have healthcare power of attorney in case of a disabling event. She says this is not a problem for her. She attends [employer] locally    ADVANCED DIRECTIVES: not in place  HEALTH MAINTENANCE: History  Substance Use Topics  . Smoking status: Never Smoker   . Smokeless tobacco: Never Used  . Alcohol Use: Yes     rarely     Colonoscopy:  PAP:  Bone density:  Lipid panel:  No Known Allergies  Current Outpatient Prescriptions  Medication Sig Dispense Refill  . hydrochlorothiazide (MICROZIDE) 12.5 MG capsule daily.       Marland Kitchen ibuprofen (ADVIL,MOTRIN) 200 MG tablet Take 200 mg by mouth every 6 (six) hours as needed.      . loratadine  (CLARITIN) 10 MG tablet Take 10 mg by mouth daily.      . Loratadine 10 MG CAPS Take by mouth as needed.       . MULTIPLE VITAMIN PO Take 1 tablet by mouth.      . phenylephrine (SUDAFED PE) 10 MG TABS Take 10 mg by mouth every 4 (four) hours as needed.      . cetirizine (ZYRTEC) 10 MG tablet Take 10 mg by mouth as needed.       . diclofenac (VOLTAREN) 0.1 % ophthalmic solution Place 1 drop into both eyes 4 (four) times daily as needed.        OBJECTIVE: Middle-aged white woman in no acute distress Filed Vitals:   11/01/11 1539  BP: 133/86  Pulse: 65  Temp: 99 F (37.2 C)  Resp: 20     Body mass index is 34.39 kg/(m^2).  ECOG FS: 0  Sclerae unicteric Oropharynx clear No peripheral adenopathy Lungs no rales or rhonchi Heart regular rate and rhythm Abd benign MSK no focal spinal tenderness, no peripheral edema; currently there is no evidence of joint inflammation Neuro: nonfocal Breasts: Right breast no suspicious findings; left breast status postlumpectomy and radiation. There is no evidence of local recurrence Skin: No rash is apparent  LAB RESULTS: Lab Results  Component Value Date   WBC 6.0 11/01/2011   NEUTROABS 3.7 11/01/2011   HGB 11.8 11/01/2011   HCT 34.8 11/01/2011   MCV 80.6 11/01/2011   PLT 235 11/01/2011      Chemistry      Component Value Date/Time   NA 141 05/10/2011 1145   K 3.9 05/10/2011 1145   CL 104 05/10/2011 1145   CO2 31 04/18/2011 0830   BUN 8 05/10/2011 1145   CREATININE 0.80 05/10/2011 1145      Component Value Date/Time   CALCIUM 9.3 04/18/2011 0830   ALKPHOS 17* 04/05/2011 1331   AST 20 04/05/2011 1331   ALT 17 04/05/2011 1331   BILITOT 0.5 04/05/2011 1331       Lab Results  Component Value Date   LABCA2 23 04/05/2011    No components found with this basename: [id number]    No results found for this basename: INR:1;PROTIME:1 in the last 168 hours  Urinalysis No results found for this basename: colorurine,  appearanceur,  labspec,  phurine,   glucoseu,  hgbur,  bilirubinur,  ketonesur,  proteinur,  urobilinogen,  nitrite,  leukocytesur   STUDIES: Mm Digital Diagnostic Unilat L  05/05/2011  *RADIOLOGY REPORT*  Clinical Data:  The patient underwent left lumpectomy for breast cancer on 04/19/2011.  A close margin was identified.  Left mammogram is requested to evaluate for residual calcifications prior to reexcision.  DIGITAL DIAGNOSTIC LEFT MAMMOGRAM WITH CAD  Comparison:  03/21/2011, 02/11/2011, 02/08/2010  Findings:  As the patient is recently postop, the examination was tailored to cause as little pain as possible.  We obtained CC and magnification CC views as well as an ML magnification view of the upper portion of the left breast.  Post lumpectomy changes are present.  Surgical clips are in the lumpectomy bed.   No suspicious residual calcifications are identified however one must note that compression is somewhat limited and postoperative change may obscure very fine calcifications. Mammographic images were processed with CAD.  IMPRESSION: No suspicious residual calcifications are identified given the limitations of limited compression and postoperative change.  BI-RADS CATEGORY 2:  Benign finding(s).  Original Report Authenticated By: Daryl Eastern, M.D.    ASSESSMENT: 46 year old [place] woman status post Left lumpectomy and sentinel lymph node sampling 04/19/2011 for a T2 N0, stage IIA invasive ductal carcinoma, grade 1 estrogen and progesterone receptor positive at 57% and 97% respectively, HER-2 negative, with an MIB-1 of 10. Her Oncotype Dx predicts a distant recurrence rate of 13% if her only systemic treatment is tamoxifen for 5 years.  (1) completer radiation 07/22/2011  (2) started tamoxifen May 2013   PLAN:  I am concerned that she had an episode of migratory arthritis associated with a rash. All these symptoms have resolved. There are extremely unlikely to be due to tamoxifen, but they may be suggestive of her  rheumatic disease. If any of those symptoms recur she will let me know and we will consider rheumatologic referral.  Otherwise she is tolerating tamoxifen and I think well. The fact that she is continuing to menstruate  does not mean tamoxifen is not working. On the other hand it will make is less worried about endometrial lining thickening. I am going to start seeing her on an every 6 month basis, so her next visit he will be in February 2014, after her repeat mammography the month before. She is considering some plastic surgery to the left breast and she will be meeting with Dr. Kelly Splinter regarding that she knows to call for any other problems that may develop before the next visit here     Colon Rueth C    11/01/2011

## 2011-11-30 ENCOUNTER — Ambulatory Visit (INDEPENDENT_AMBULATORY_CARE_PROVIDER_SITE_OTHER): Payer: BC Managed Care – PPO | Admitting: Surgery

## 2011-11-30 ENCOUNTER — Encounter (INDEPENDENT_AMBULATORY_CARE_PROVIDER_SITE_OTHER): Payer: Self-pay | Admitting: Surgery

## 2011-11-30 VITALS — BP 118/82 | HR 60 | Temp 98.1°F | Resp 12 | Ht 67.0 in | Wt 220.0 lb

## 2011-11-30 DIAGNOSIS — C50419 Malignant neoplasm of upper-outer quadrant of unspecified female breast: Secondary | ICD-10-CM

## 2011-11-30 NOTE — Progress Notes (Signed)
Re:   [name] [name] DOB:   Jun 08, 1965 MRN:   [id number]  ASSESSMENT AND PLAN: 1.  Left breast cancer, 12 o'clock (central in breast)  Lumpectomy 04/19/2011.  (Re-excision of inferior margin 2/26/20213 - negative)  Final pathology - 3.2 cm cancer - T2, N0.  ER - 57%.  PR - 97%.  Sees Drs. Magrinat and Basilio Cairo.  Oncotype - 20.  Associated with 13% recurrence rate.    Patient declined genetics.  On Tamoxifen.  She had some early problems, but she is tolerating now.  She is seeing Dr. Rhona Leavens for discussion of cosmesis.  Disease free.  She will see me back in 6 months.   2.  Lap band - 01/27/2009.  Initial weight 273, BMI 42.9.  She is happy with weight loss.  Her port is prominent.  She got away from exercise, but she is getting back.  She saw Mardelle Matte on 08/04/2011 - he removed 0.5 cc from her lap band.  She says that she has trouble with allergies in the spring and this prompted the fluid removal.  She is doing okay now. 3.  Hypertension. 4.  Osteoarthritis of back.  Better with weight loss.  Chief Complaint  Patient presents with  . Routine Post Op    Breast biopsy 03/21/11   REFERRING PHYSICIAN: Neldon Labella, MD, MD  HISTORY OF PRESENT ILLNESS: [name] [name] is a 46 y.o. (DOB: 1965/07/02)  white female whose primary care physician is Neldon Labella, MD, MD and comes to me today for follow up of  left breast cancer.  She is doing well.  Has occasional pain in the left axilla.  She wondered whether she has some left lymphedema, but she is not having problems now.  We talked about her lap band and she is just now starting exercise again.  Breast History: She had a right breast biopsy in 08/07/2009, which showed pseudoangiomatous stromal hyperplasia.   She had a screening mammogram 03/21/2011 that showed a 3 x 2.3 cm area of calcifications in the upper central left breast.  She underwent a core biopsy which showed an invasive ductal carcinoma.  She had a lumpectomy and left  axillary SLNBx on 04/19/2011.  Unfortunately, her inferior margin is close.  But re-excision on 05/10/2011 was negative.    Path (side, TNM): Invasive ductal ca, left T2, N0 Surgery: Left lumpectomt  Date: - 04/19/2011 Size of tumor: 3.2  (inferior margin 1 mm) Nodes: -/- ER: 57% PR: 97% Ki67: 10%  HER2Neu: -=Negative.  Medical Oncologist: - Magrinat Radiation Oncologist: Basilio Cairo    Past Medical History  Diagnosis Date  . Status post biopsy 03/21/11    Breast, Left, Needle Core Biopsy, Upper Central - DCIS, Grade I-III with comedo Necrosis and Calcifications. ER+, PR+, Ki-67 10%, Her2- No Amplification  . Arthritis     Osteoarthritis Neck and Back  . Cancer     Left breast cancer  . Anemia     not at present     Current Outpatient Prescriptions  Medication Sig Dispense Refill  . cetirizine (ZYRTEC) 10 MG tablet Take 10 mg by mouth as needed.       . diclofenac (VOLTAREN) 0.1 % ophthalmic solution Place 1 drop into both eyes 4 (four) times daily as needed.      . hydrochlorothiazide (MICROZIDE) 12.5 MG capsule daily.       Marland Kitchen ibuprofen (ADVIL,MOTRIN) 200 MG tablet Take 200 mg by mouth every 6 (six) hours as needed.      Marland Kitchen  Loratadine 10 MG CAPS Take by mouth as needed.       . MULTIPLE VITAMIN PO Take 1 tablet by mouth.      . phenylephrine (SUDAFED PE) 10 MG TABS Take 10 mg by mouth every 4 (four) hours as needed.      . zolpidem (AMBIEN) 5 MG tablet Take 5 mg by mouth at bedtime as needed.          Allergies  Allergen Reactions  . Hydrocodone-Acetaminophen Other (See Comments)    Causes severe constipation. If pain medication is required, do not issue this type per patient request.   REVIEW OF SYSTEMS:  Cardiac:  Hypertension, though she is only on diuretics. Musculoskeletal:  HIstory of back pain/osteroarthritis.  This is better since her weight loss surgery.  SOCIAL and FAMILY HISTORY: Separated. She works in Clinical biochemist at [employer].  PHYSICAL EXAM: BP 132/84   Pulse 68  Temp(Src) 97.8 F (36.6 C) (Temporal)  Resp 16  Ht 5\' 7"  (1.702 m)  Wt 214 lb 3.2 oz (97.16 kg)  BMI 33.55 kg/m2  LMP 04/19/2011  General: WN WF who is alert and generally healthy appearing.  HEENT: Normal. Pupils equal. Good dentition. Breasts:  Left: Transverse incision at 12 o'clock above nipple.  Looks good.  Her right axillary incision looks good.  Right:  Unremarkable. Lymph nodes:  Okay. Upper extremities:  No evidence of lymphedema.  DATA REVIEWED: For mammogram in Jan 2013.  Ovidio Kin, MD,  [facility] [address] [place]  [address]   [place], [place]    [address] Phone:  [phone] FAX:  [phone]

## 2012-01-04 ENCOUNTER — Other Ambulatory Visit: Payer: Self-pay | Admitting: Obstetrics and Gynecology

## 2012-01-04 DIAGNOSIS — Z853 Personal history of malignant neoplasm of breast: Secondary | ICD-10-CM

## 2012-02-14 ENCOUNTER — Ambulatory Visit
Admission: RE | Admit: 2012-02-14 | Discharge: 2012-02-14 | Disposition: A | Discharge status: Home / Self Care | Payer: BC Managed Care – PPO | Source: Ambulatory Visit | Attending: Obstetrics and Gynecology | Admitting: Obstetrics and Gynecology

## 2012-02-14 ENCOUNTER — Encounter (HOSPITAL_BASED_OUTPATIENT_CLINIC_OR_DEPARTMENT_OTHER): Payer: Self-pay | Admitting: *Deleted

## 2012-02-14 DIAGNOSIS — Z853 Personal history of malignant neoplasm of breast: Secondary | ICD-10-CM

## 2012-02-14 NOTE — Progress Notes (Signed)
No labs needed

## 2012-02-15 ENCOUNTER — Encounter (HOSPITAL_BASED_OUTPATIENT_CLINIC_OR_DEPARTMENT_OTHER): Payer: Self-pay | Admitting: Anesthesiology

## 2012-02-15 ENCOUNTER — Encounter (HOSPITAL_BASED_OUTPATIENT_CLINIC_OR_DEPARTMENT_OTHER): Payer: Self-pay | Admitting: *Deleted

## 2012-02-15 ENCOUNTER — Ambulatory Visit (HOSPITAL_BASED_OUTPATIENT_CLINIC_OR_DEPARTMENT_OTHER): Payer: BC Managed Care – PPO | Admitting: Anesthesiology

## 2012-02-15 ENCOUNTER — Encounter (HOSPITAL_BASED_OUTPATIENT_CLINIC_OR_DEPARTMENT_OTHER): Payer: Self-pay | Admitting: Plastic Surgery

## 2012-02-15 ENCOUNTER — Encounter (HOSPITAL_BASED_OUTPATIENT_CLINIC_OR_DEPARTMENT_OTHER)
Admission: RE | Disposition: A | Discharge status: Home / Self Care | Payer: Self-pay | Source: Ambulatory Visit | Attending: Plastic Surgery

## 2012-02-15 ENCOUNTER — Ambulatory Visit (HOSPITAL_BASED_OUTPATIENT_CLINIC_OR_DEPARTMENT_OTHER)
Admission: RE | Admit: 2012-02-15 | Discharge: 2012-02-15 | Disposition: A | Discharge status: Home / Self Care | Payer: BC Managed Care – PPO | Source: Ambulatory Visit | Attending: Plastic Surgery | Admitting: Plastic Surgery

## 2012-02-15 DIAGNOSIS — L905 Scar conditions and fibrosis of skin: Secondary | ICD-10-CM | POA: Insufficient documentation

## 2012-02-15 DIAGNOSIS — N651 Disproportion of reconstructed breast: Secondary | ICD-10-CM | POA: Insufficient documentation

## 2012-02-15 DIAGNOSIS — Z853 Personal history of malignant neoplasm of breast: Secondary | ICD-10-CM | POA: Insufficient documentation

## 2012-02-15 HISTORY — DX: Other seasonal allergic rhinitis: J30.2

## 2012-02-15 HISTORY — PX: BREAST REDUCTION SURGERY: SHX8

## 2012-02-15 SURGERY — MAMMOPLASTY, REDUCTION
Anesthesia: General | Site: Breast | Laterality: Right | Wound class: Clean

## 2012-02-15 MED ORDER — PROPOFOL 10 MG/ML IV BOLUS
INTRAVENOUS | Status: DC | PRN
Start: 2012-02-15 — End: 2012-02-15
  Administered 2012-02-15: 150 mg via INTRAVENOUS

## 2012-02-15 MED ORDER — OXYCODONE HCL 5 MG PO TABS
5.0000 mg | ORAL_TABLET | Freq: Once | ORAL | Status: AC | PRN
  Administered 2012-02-15: 5 mg via ORAL

## 2012-02-15 MED ORDER — PROMETHAZINE HCL 25 MG/ML IJ SOLN: 6.2500 mg | Freq: Once | INTRAMUSCULAR | Status: DC

## 2012-02-15 MED ORDER — ONDANSETRON HCL 4 MG/2ML IJ SOLN
INTRAMUSCULAR | Status: DC | PRN
  Administered 2012-02-15: 4 mg via INTRAVENOUS

## 2012-02-15 MED ORDER — GLYCOPYRROLATE 0.2 MG/ML IJ SOLN
INTRAMUSCULAR | Status: DC | PRN
  Administered 2012-02-15: 0.2 mg via INTRAVENOUS

## 2012-02-15 MED ORDER — HYDROMORPHONE HCL PF 1 MG/ML IJ SOLN
0.2500 mg | INTRAMUSCULAR | Status: DC | PRN
Start: 2012-02-15 — End: 2012-02-15
  Administered 2012-02-15 (×4): 0.5 mg via INTRAVENOUS

## 2012-02-15 MED ORDER — LACTATED RINGERS IV SOLN
INTRAVENOUS | Status: DC
  Administered 2012-02-15 (×2): via INTRAVENOUS

## 2012-02-15 MED ORDER — MIDAZOLAM HCL 2 MG/2ML IJ SOLN: 1.0000 mg | INTRAMUSCULAR | Status: DC | PRN

## 2012-02-15 MED ORDER — LIDOCAINE HCL (CARDIAC) 20 MG/ML IV SOLN
INTRAVENOUS | Status: DC | PRN
  Administered 2012-02-15: 50 mg via INTRAVENOUS

## 2012-02-15 MED ORDER — DEXAMETHASONE SODIUM PHOSPHATE 4 MG/ML IJ SOLN
INTRAMUSCULAR | Status: DC | PRN
  Administered 2012-02-15: 10 mg via INTRAVENOUS

## 2012-02-15 MED ORDER — SODIUM CHLORIDE 0.9 % IR SOLN
Status: DC | PRN
  Administered 2012-02-15: 15:00:00

## 2012-02-15 MED ORDER — CEFAZOLIN SODIUM-DEXTROSE 2-3 GM-% IV SOLR
INTRAVENOUS | Status: DC | PRN
  Administered 2012-02-15: 2 g via INTRAVENOUS

## 2012-02-15 MED ORDER — ACETAMINOPHEN 10 MG/ML IV SOLN
1000.0000 mg | Freq: Once | INTRAVENOUS | Status: AC
  Administered 2012-02-15 (×2): 1000 mg via INTRAVENOUS

## 2012-02-15 MED ORDER — MIDAZOLAM HCL 5 MG/5ML IJ SOLN
INTRAMUSCULAR | Status: DC | PRN
  Administered 2012-02-15: 2 mg via INTRAVENOUS

## 2012-02-15 MED ORDER — OXYCODONE HCL 5 MG/5ML PO SOLN: 5.0000 mg | Freq: Once | ORAL | Status: AC | PRN

## 2012-02-15 MED ORDER — SUCCINYLCHOLINE CHLORIDE 20 MG/ML IJ SOLN
INTRAMUSCULAR | Status: DC | PRN
  Administered 2012-02-15: 100 mg via INTRAVENOUS

## 2012-02-15 MED ORDER — EPHEDRINE SULFATE 50 MG/ML IJ SOLN
INTRAMUSCULAR | Status: DC | PRN
  Administered 2012-02-15 (×2): 10 mg via INTRAVENOUS

## 2012-02-15 MED ORDER — FENTANYL CITRATE 0.05 MG/ML IJ SOLN
INTRAMUSCULAR | Status: DC | PRN
  Administered 2012-02-15: 50 ug via INTRAVENOUS
  Administered 2012-02-15: 100 ug via INTRAVENOUS
  Administered 2012-02-15: 50 ug via INTRAVENOUS

## 2012-02-15 MED ORDER — MORPHINE SULFATE 10 MG/ML IJ SOLN
INTRAMUSCULAR | Status: DC | PRN
  Administered 2012-02-15 (×2): 2 mg via INTRAVENOUS

## 2012-02-15 MED ORDER — SODIUM CHLORIDE 0.9 % IJ SOLN
INTRAMUSCULAR | Status: DC | PRN
  Administered 2012-02-15: 50 mL

## 2012-02-15 MED ORDER — FENTANYL CITRATE 0.05 MG/ML IJ SOLN: 50.0000 ug | INTRAMUSCULAR | Status: DC | PRN

## 2012-02-15 MED ORDER — LIDOCAINE-EPINEPHRINE 1 %-1:100000 IJ SOLN
INTRAMUSCULAR | Status: DC | PRN
  Administered 2012-02-15: 20 mL

## 2012-02-15 SURGICAL SUPPLY — 62 items
ADH SKN CLS APL DERMABOND .7 (GAUZE/BANDAGES/DRESSINGS) ×6
BAG DECANTER FOR FLEXI CONT (MISCELLANEOUS) ×4 IMPLANT
BANDAGE GAUZE ELAST BULKY 4 IN (GAUZE/BANDAGES/DRESSINGS) ×8 IMPLANT
BINDER ABD UNIV 12 30-45 (MISCELLANEOUS) ×1 IMPLANT
BINDER ABDOMINAL 12 (MISCELLANEOUS) ×4
BINDER BREAST LRG (GAUZE/BANDAGES/DRESSINGS) IMPLANT
BINDER BREAST MEDIUM (GAUZE/BANDAGES/DRESSINGS) IMPLANT
BINDER BREAST XLRG (GAUZE/BANDAGES/DRESSINGS) ×2 IMPLANT
BINDER BREAST XXLRG (GAUZE/BANDAGES/DRESSINGS) IMPLANT
BLADE HEX COATED 2.75 (ELECTRODE) ×4 IMPLANT
BLADE SURG 10 STRL SS (BLADE) ×8 IMPLANT
BLADE SURG 15 STRL LF DISP TIS (BLADE) ×6 IMPLANT
BLADE SURG 15 STRL SS (BLADE) ×8
CANISTER LINER 1300 C W/ELBOW (MISCELLANEOUS) ×2 IMPLANT
CANISTER SUCTION 1200CC (MISCELLANEOUS) ×4 IMPLANT
CHLORAPREP W/TINT 26ML (MISCELLANEOUS) ×8 IMPLANT
CLOTH BEACON ORANGE TIMEOUT ST (SAFETY) ×4 IMPLANT
COVER MAYO STAND STRL (DRAPES) ×4 IMPLANT
COVER TABLE BACK 60X90 (DRAPES) ×4 IMPLANT
DECANTER SPIKE VIAL GLASS SM (MISCELLANEOUS) IMPLANT
DERMABOND ADVANCED (GAUZE/BANDAGES/DRESSINGS) ×2
DERMABOND ADVANCED .7 DNX12 (GAUZE/BANDAGES/DRESSINGS) ×6 IMPLANT
DRAPE LAPAROSCOPIC ABDOMINAL (DRAPES) ×4 IMPLANT
DRSG PAD ABDOMINAL 8X10 ST (GAUZE/BANDAGES/DRESSINGS) ×10 IMPLANT
ELECT BLADE 4.0 EZ CLEAN MEGAD (MISCELLANEOUS) ×4
ELECT BLADE 6.5 .24CM SHAFT (ELECTRODE) IMPLANT
ELECT REM PT RETURN 9FT ADLT (ELECTROSURGICAL) ×4
ELECTRODE BLDE 4.0 EZ CLN MEGD (MISCELLANEOUS) ×3 IMPLANT
ELECTRODE REM PT RTRN 9FT ADLT (ELECTROSURGICAL) ×3 IMPLANT
FILTER 7/8 IN (FILTER) ×4 IMPLANT
FILTER LIPOSUCTION (MISCELLANEOUS) ×2 IMPLANT
GLOVE BIO SURGEON STRL SZ 6.5 (GLOVE) ×12 IMPLANT
GLOVE SKINSENSE NS SZ6.5 (GLOVE) ×1
GLOVE SKINSENSE NS SZ7.0 (GLOVE) ×1
GLOVE SKINSENSE STRL SZ6.5 (GLOVE) ×1 IMPLANT
GLOVE SKINSENSE STRL SZ7.0 (GLOVE) ×1 IMPLANT
GOWN PREVENTION PLUS XLARGE (GOWN DISPOSABLE) ×14 IMPLANT
NDL SPNL 18GX3.5 QUINCKE PK (NEEDLE) ×2 IMPLANT
NDL SPNL 22GX3.5 QUINCKE BK (NEEDLE) IMPLANT
NEEDLE SPNL 18GX3.5 QUINCKE PK (NEEDLE) ×4 IMPLANT
NEEDLE SPNL 22GX3.5 QUINCKE BK (NEEDLE) IMPLANT
NS IRRIG 1000ML POUR BTL (IV SOLUTION) ×2 IMPLANT
PACK BASIN DAY SURGERY FS (CUSTOM PROCEDURE TRAY) ×4 IMPLANT
PENCIL BUTTON HOLSTER BLD 10FT (ELECTRODE) ×4 IMPLANT
SLEEVE SCD COMPRESS KNEE MED (MISCELLANEOUS) ×4 IMPLANT
SPONGE LAP 18X18 X RAY DECT (DISPOSABLE) ×8 IMPLANT
SUT MON AB 5-0 PS2 18 (SUTURE) ×8 IMPLANT
SUT PDS AB 2-0 CT2 27 (SUTURE) IMPLANT
SUT VIC AB 3-0 SH 27 (SUTURE) ×8
SUT VIC AB 3-0 SH 27X BRD (SUTURE) ×6 IMPLANT
SUT VICRYL 4-0 PS2 18IN ABS (SUTURE) ×8 IMPLANT
SYR 20CC LL (SYRINGE) ×8 IMPLANT
SYR 50ML LL SCALE MARK (SYRINGE) IMPLANT
SYR BULB IRRIGATION 50ML (SYRINGE) ×4 IMPLANT
SYR CONTROL 10ML LL (SYRINGE) ×12 IMPLANT
TOWEL OR 17X24 6PK STRL BLUE (TOWEL DISPOSABLE) ×12 IMPLANT
TUBE CONNECTING 20X1/4 (TUBING) ×4 IMPLANT
TUBING SET GRADUATE ASPIR 12FT (MISCELLANEOUS) ×4 IMPLANT
UNDERPAD 30X30 INCONTINENT (UNDERPADS AND DIAPERS) ×8 IMPLANT
VAC PENCILS W/TUBING CLEAR (MISCELLANEOUS) ×4 IMPLANT
WATER STERILE IRR 1000ML POUR (IV SOLUTION) IMPLANT
YANKAUER SUCT BULB TIP NO VENT (SUCTIONS) ×4 IMPLANT

## 2012-02-15 NOTE — H&P (Signed)
This document contains confidential information from a [facility] medical record system and may be unauthenticated. Release may be made only with a valid authorization or in accordance with applicable policies of Medical Center or its affiliates. This document must be maintained in a secure manner or discarded/destroyed as required by Medical Center policy or by a confidential means such as shredding.   [name] [name]   02/14/2012 8:45 AM Office Visit  MRN: [id number]  Department:  Plastic Surgery  Dept Phone: [phone]  Description: Female DOB: 1965-06-18  Provider: Wayland Denis, DO    Diagnoses  - Postoperative breast asymmetry   - Primary   611.89     Reason for Visit  -  Breast Reconstruction     Vitals - Last Recorded      143/83  56  98.2 F (36.8 C) (Oral)  1.702 m (5' 7.01")  97.977 kg (216 lb)  33.82 kg/m2     Subjective:   Patient ID: [name] [name] is a 46 y.o. female.  HPI The patient is a 46 yrs old female here for a preoperative history and physical for correction of her breast asymmetry. She is planning on a right breast mastopexy/reduction and fat injection in the left breast.  She underwent a yearly screening mammagram 02/11/11 which showed heterogeneously dense tissue in the left breast. She went on to have a biopsy which showed invasive ductal breast cancer (grade 1-2), with ductal carcinoma in situ (grade 1-3). The tumor was ER/PR positive, HER-2 negative. Bilateral breast MRI showed a round enhancing mass with irregular margins measuring 6mm. She underwent a lumpectomy twice to obtain negative margins. She finished radiation in May. She is 5 feet 7 inches tall. She understands we are trying to achieve better breast symmetry but it will not be exact.  The right is a C and the left is a B. She is a 39 circumferencially.  The following portions of the patient's history were reviewed and updated as appropriate: allergies, current medications,  past family history, past medical history, past social history, past surgical history and problem list.  Review of Systems  Constitutional: Negative.   HENT: Negative.   Eyes: Negative.   Respiratory: Negative.   Cardiovascular: Negative.   Gastrointestinal: Negative.   Genitourinary: Negative.   Musculoskeletal: Negative.   Hematological: Negative.   Psychiatric/Behavioral: Negative.    Objective:    Physical Exam  Constitutional: She appears well-developed and well-nourished.  HENT:   Head: Normocephalic and atraumatic.  Eyes: Conjunctivae normal and EOM are normal. Pupils are equal, round, and reactive to light.  Neck: Normal range of motion.  Cardiovascular: Normal rate.   Pulmonary/Chest: Effort normal. No respiratory distress. She has no wheezes.  Abdominal: Soft. She exhibits no distension. There is no tenderness.  Neurological: She is alert.  Skin: Skin is warm.  Psychiatric: She has a normal mood and affect. Her behavior is normal. Judgment and thought content normal.      Assessment:     1.  Postoperative breast asymmetry        Plan:     Right breast mastopexy/reduction with fat grafting of the left breast for symmetry.  Consent was signed and risks reviewed which included bleeding, pain, scar and risk of infection.  Medications Ordered This Encounter     cephalexin (KEFLEX) 500 MG capsule 28 capsule 0 02/14/2012      Take 1 capsule (500 mg total) by mouth 4 times daily. - Oral  HYDROcodone-acetaminophen (NORCO) 5-325 mg per tablet 30 tablet 0 02/14/2012 02/24/2012    Take 1 tablet by mouth every 6 (six) hours as needed for 10 days for Pain.    docusate sodium (COLACE) 100 MG capsule 10 Take 1 capsule (100 mg total) by mouth 3 times daily.    promethazine (PHENERGAN) 12.5 MG tablet 10Take 2 tablets (25 mg total) by mouth every 6 (six) hours as needed for 7 days for Nausea.

## 2012-02-15 NOTE — Anesthesia Preprocedure Evaluation (Addendum)
Anesthesia Evaluation  Patient identified by MRN, date of birth, ID band Patient awake    Reviewed: Allergy & Precautions, H&P , NPO status , Patient's Chart, lab work & pertinent test results  Airway Mallampati: II TM Distance: >3 FB Neck ROM: Full    Dental No notable dental hx. (+) Teeth Intact and Dental Advisory Given   Pulmonary neg pulmonary ROS,  breath sounds clear to auscultation  Pulmonary exam normal       Cardiovascular negative cardio ROS  Rhythm:Regular Rate:Normal     Neuro/Psych negative neurological ROS  negative psych ROS   GI/Hepatic negative GI ROS, Neg liver ROS,   Endo/Other  negative endocrine ROS  Renal/GU negative Renal ROS  negative genitourinary   Musculoskeletal   Abdominal   Peds  Hematology negative hematology ROS (+)   Anesthesia Other Findings   Reproductive/Obstetrics negative OB ROS                           Anesthesia Physical Anesthesia Plan  ASA: II  Anesthesia Plan: General   Post-op Pain Management:    Induction: Intravenous  Airway Management Planned: Oral ETT  Additional Equipment:   Intra-op Plan:   Post-operative Plan: Extubation in OR  Informed Consent: I have reviewed the patients History and Physical, chart, labs and discussed the procedure including the risks, benefits and alternatives for the proposed anesthesia with the patient or authorized representative who has indicated his/her understanding and acceptance.   Dental advisory given  Plan Discussed with: CRNA  Anesthesia Plan Comments:        Anesthesia Quick Evaluation

## 2012-02-15 NOTE — Transfer of Care (Signed)
Immediate Anesthesia Transfer of Care Note  Patient: [name] [name]  Procedure(s) Performed: Procedure(s) (LRB) with comments: MAMMARY REDUCTION  (BREAST) (Right) - RIGHT BREAST MASTOPEXY FAT INJECTION (Left) - LEFT BREAST FAT INJECTION    Patient Location: PACU  Anesthesia Type:General  Level of Consciousness: awake, alert  and oriented  Airway & Oxygen Therapy: Patient Spontanous Breathing and Patient connected to face mask oxygen  Post-op Assessment: Report given to PACU RN and Post -op Vital signs reviewed and stable  Post vital signs: Reviewed and stable  Complications: No apparent anesthesia complications

## 2012-02-15 NOTE — Addendum Note (Signed)
Addendum  created 02/15/12 1742 by Zenon Mayo, MD   Modules edited:Orders

## 2012-02-15 NOTE — Anesthesia Postprocedure Evaluation (Signed)
  Anesthesia Post-op Note  Patient: [name] [name]  Procedure(s) Performed: Procedure(s) (LRB) with comments: MAMMARY REDUCTION  (BREAST) (Right) - RIGHT BREAST MASTOPEXY FAT INJECTION (Left) - LEFT BREAST FAT INJECTION    Patient Location: PACU  Anesthesia Type:General  Level of Consciousness: awake and alert   Airway and Oxygen Therapy: Patient Spontanous Breathing  Post-op Pain: mild  Post-op Assessment: Post-op Vital signs reviewed, Patient's Cardiovascular Status Stable, Respiratory Function Stable, Patent Airway and No signs of Nausea or vomiting  Post-op Vital Signs: Reviewed and stable  Complications: No apparent anesthesia complications

## 2012-02-15 NOTE — Anesthesia Procedure Notes (Signed)
Procedure Name: Intubation Date/Time: 02/15/2012 2:26 PM Performed by: Zenia Resides D Pre-anesthesia Checklist: Patient identified, Emergency Drugs available, Suction available, Patient being monitored and Timeout performed Patient Re-evaluated:Patient Re-evaluated prior to inductionOxygen Delivery Method: Circle System Utilized Preoxygenation: Pre-oxygenation with 100% oxygen Intubation Type: IV induction Ventilation: Mask ventilation without difficulty Laryngoscope Size: Mac and 3 Grade View: Grade I Tube type: Oral Tube size: 7.0 mm Number of attempts: 1 Airway Equipment and Method: stylet and oral airway Placement Confirmation: ETT inserted through vocal cords under direct vision,  positive ETCO2 and breath sounds checked- equal and bilateral Secured at: 22 cm Tube secured with: Tape Dental Injury: Teeth and Oropharynx as per pre-operative assessment

## 2012-02-15 NOTE — Brief Op Note (Signed)
02/15/2012  5:00 PM  PATIENT:  [name]  46 y.o. female  PRE-OPERATIVE DIAGNOSIS:  BREAST CANCER --LEFT  POST-OPERATIVE DIAGNOSIS:  BREAST CANCER --LEFT  PROCEDURE:  Procedure(s) (LRB) with comments: MAMMARY REDUCTION  (BREAST) (Right) - RIGHT BREAST MASTOPEXY FAT INJECTION (Left) - LEFT BREAST FAT INJECTION    SURGEON:  Surgeon(s) and Role:    * Shaylin Blatt Sanger, DO - Primary  PHYSICIAN ASSISTANT: none  ASSISTANTS: Harrie Foreman, LPN   ANESTHESIA:   general  EBL:  Total I/O In: 1000 [I.V.:1000] Out: - 50 cc  BLOOD ADMINISTERED:none  DRAINS: none   LOCAL MEDICATIONS USED:  LIDOCAINE   SPECIMEN:  none  DISPOSITION OF SPECIMEN:  N/A  COUNTS:  YES  TOURNIQUET:  * No tourniquets in log *  DICTATION: dictation  PLAN OF CARE: Discharge to home after PACU  PATIENT DISPOSITION:  PACU - hemodynamically stable.   Delay start of Pharmacological VTE agent (>24hrs) due to surgical blood loss or risk of bleeding: no

## 2012-02-15 NOTE — Interval H&P Note (Signed)
History and Physical Interval Note:  02/15/2012 1:57 PM  [name] [name]  has presented today for surgery, with the diagnosis of BREAST CANCER   The various methods of treatment have been discussed with the patient and family. After consideration of risks, benefits and other options for treatment, the patient has consented to  Procedure(s) (LRB) with comments: MAMMARY REDUCTION  (BREAST) (Right) - RIGHT BREAST REDUCTION FAT INJECTION (Left) - LEFT BREAST FAT INJECTION  WITH LIPOSUCTION  BREAST REDUCTION WITH LIPOSUCTION (Left) - LEFT BREAST FAT INJECTION  WITH LIPOSUCTION  as a surgical intervention .  The patient's history has been reviewed, patient examined, no change in status, stable for surgery.  I have reviewed the patient's chart and labs.  Questions were answered to the patient's satisfaction.     SANGER,CLAIRE

## 2012-02-16 ENCOUNTER — Encounter (HOSPITAL_BASED_OUTPATIENT_CLINIC_OR_DEPARTMENT_OTHER): Payer: Self-pay | Admitting: Plastic Surgery

## 2012-02-16 LAB — POCT HEMOGLOBIN-HEMACUE: Hemoglobin: 11.9 g/dL — ABNORMAL LOW (ref 12.0–15.0)

## 2012-02-16 NOTE — Op Note (Deleted)
NAME:  [name], [name]              ACCOUNT NO.:  [id number]  MEDICAL RECORD NO.:  [id number]  LOCATION:  [facility]                         FACILITY:  [facility]  PHYSICIAN:  Jaron Czarnecki Sanger, DO      DATE OF BIRTH:  07/26/1965  DATE OF PROCEDURE:  02/15/2012 DATE OF DISCHARGE:  08/31/2011                              OPERATIVE REPORT   PREOPERATIVE DIAGNOSIS:  Left breast cancer history with breast asymmetry and scar contracture of the left breast.  POSTOPERATIVE DIAGNOSIS:  Left breast cancer history with breast asymmetry and scar contracture of the left breast.  PROCEDURE:  Right mastopexy with fat grafting of the left breast.  ATTENDING:  Alissia Lory Sanger, DO  ANESTHESIA:  General.  ASSISTANT:  Mindy Toth, LPN  INDICATION FOR PROCEDURE:  The patient is a 46-year-old female with severe breast asymmetry secondary to a lumpectomy of the left breast for breast cancer.  She also had scar contracture that was quite tight. Risks and complications were reviewed and included bleeding, pain, scar, risk of anesthesia, contracture, worsening of the scar, pain from removal of the location of the fat, nipple areola loss on the right side, and she wished to proceed.  DESCRIPTION OF PROCEDURE:  The patient was taken to the operating room after she had been marked in the preop area.  Risks and complications have been reviewed.  Consent was signed and confirmed.  She was placed on the operating room table in supine position.  General anesthesia was administered.  Once adequate, a time-out was called.  All information was confirmed to be correct.  She was prepped and draped in usual sterile fashion.  We began on the right side.  The mastopexy was done with  ice cream cone type procedure.  The 45 mm cookie cutter was utilized as a template for moving the nipple areola more superiorly, which was approximately 1 cm and a half more superior than its location. The patient was marked with the ice cream cone to  remove the inferior portion of the nipple areola and tighten the inferior pole.  A 10 blade was used to make the incision.  The skin was deepithelialized.  The Bovie was used to lift the flaps 2 cm medially and laterally, as well as to create a space for the nipple areola.  The patient was sat up and markings were confirmed.  The deep layers were closed with 3-0 Vicryl followed by a 4-0 Vicryl and a running subcuticular 5-0 Monocryl was used to close the skin.  Attention was then turned to the left breast. Lidocaine with 1:100 epinephrine and 50 mL of normal saline was used to create the tumescent for the abdominal area.  This was infused in the abdominal area and then suction was used to remove the fat.  The fat was then irrigated and placed in 10 mL syringes.  Spinal needle was used to break the scar contracture at the deep level on the left breast through a stab incision on the lateral areola juncture at the skin.  Once that was complete, the fat was infused into the area to fill the defect and improved symmetry and contour.  120 mL of fat was utilized.    The stab incision of the umbilicus and left breast was closed with 5-0 Monocryl. Dermabond was applied to the right breast.  ABDs were placed with an abdominal binder and a breast binder.  The patient was then allowed to wake up, extubated, and taken to recovery room in stable condition.  She tolerated the procedure well.  There were no complications.     Devaris Quirk Sanger, DO     CS/MEDQ  D:  02/16/2012  T:  02/16/2012  Job:  [id number] 

## 2012-02-16 NOTE — Op Note (Cosign Needed)
NAME:  [name] [name], [name] [name] NO.:  [id number]  MEDICAL RECORD NO.:  [id number]  LOCATION:  [facility]                         FACILITY:  [facility]  PHYSICIAN:  Claire Sanger, DO      DATE OF BIRTH:  24-Mar-1965  DATE OF PROCEDURE:  02/15/2012 DATE OF DISCHARGE:  08/31/2011                              OPERATIVE REPORT   PREOPERATIVE DIAGNOSIS:  Left breast cancer history with breast asymmetry and scar contracture of the left breast.  POSTOPERATIVE DIAGNOSIS:  Left breast cancer history with breast asymmetry and scar contracture of the left breast.  PROCEDURE:  Right mastopexy with fat grafting of the left breast.  ATTENDING:  Wayland Denis, DO  ANESTHESIA:  General.  ASSISTANT:  Harrie Foreman, LPN  INDICATION FOR PROCEDURE:  The patient is a 46 year old female with severe breast asymmetry secondary to a lumpectomy of the left breast for breast cancer.  She also had scar contracture that was quite tight. Risks and complications were reviewed and included bleeding, pain, scar, risk of anesthesia, contracture, worsening of the scar, pain from removal of the location of the fat, nipple areola loss on the right side, and she wished to proceed.  DESCRIPTION OF PROCEDURE:  The patient was taken to the operating room after she had been marked in the preop area.  Risks and complications have been reviewed.  Consent was signed and confirmed.  She was placed on the operating room table in supine position.  General anesthesia was administered.  Once adequate, a time-out was called.  All information was confirmed to be correct.  She was prepped and draped in usual sterile fashion.  We began on the right side.  The mastopexy was done with  ice cream cone type procedure.  The 45 mm cookie cutter was utilized as a template for moving the nipple areola more superiorly, which was approximately 1 cm and a half more superior than its location. The patient was marked with the ice cream cone to  remove the inferior portion of the nipple areola and tighten the inferior pole.  A 10 blade was used to make the incision.  The skin was deepithelialized.  The Bovie was used to lift the flaps 2 cm medially and laterally, as well as to create a space for the nipple areola.  The patient was sat up and markings were confirmed.  The deep layers were closed with 3-0 Vicryl followed by a 4-0 Vicryl and a running subcuticular 5-0 Monocryl was used to close the skin.  Attention was then turned to the left breast. Lidocaine with 1:100 epinephrine and 50 mL of normal saline was used to create the tumescent for the abdominal area.  This was infused in the abdominal area and then suction was used to remove the fat.  The fat was then irrigated and placed in 10 mL syringes.  Spinal needle was used to break the scar contracture at the deep level on the left breast through a stab incision on the lateral areola juncture at the skin.  Once that was complete, the fat was infused into the area to fill the defect and improved symmetry and contour.  120 mL of fat was utilized.  The stab incision of the umbilicus and left breast was closed with 5-0 Monocryl. Dermabond was applied to the right breast.  ABDs were placed with an abdominal binder and a breast binder.  The patient was then allowed to wake up, extubated, and taken to recovery room in stable condition.  She tolerated the procedure well.  There were no complications.     Wayland Denis, DO     CS/MEDQ  D:  02/16/2012  T:  02/16/2012  Job:  [id number]

## 2012-04-12 ENCOUNTER — Ambulatory Visit (INDEPENDENT_AMBULATORY_CARE_PROVIDER_SITE_OTHER): Payer: BC Managed Care – PPO | Admitting: Physician Assistant

## 2012-04-12 ENCOUNTER — Encounter (INDEPENDENT_AMBULATORY_CARE_PROVIDER_SITE_OTHER): Payer: Self-pay | Admitting: Physician Assistant

## 2012-04-12 VITALS — BP 124/80 | HR 92 | Resp 18 | Ht 67.0 in | Wt 231.4 lb

## 2012-04-12 DIAGNOSIS — Z4651 Encounter for fitting and adjustment of gastric lap band: Secondary | ICD-10-CM

## 2012-04-12 NOTE — Progress Notes (Signed)
  HISTORY: [name] [name] is a 47 y.o.female who received an AP-Standard lap-band in November 2010 by Dr. Ezzard Standing. She comes in with 19 lbs weight gain since her last visit in May 2013. She has since had radiation and subsequent weight loss but she has an increased appetite and weight gain since finishing radiation. She denies regurgitation or reflux. She would like a fill today. She's also engaging in a 5k run.  VITAL SIGNS: Filed Vitals:   04/12/12 0920  BP: 124/80  Pulse: 92  Resp: 18    PHYSICAL EXAM: Physical exam reveals a very well-appearing 47 y.o.female in no apparent distress Neurologic: Awake, alert, oriented Psych: Bright affect, conversant Respiratory: Breathing even and unlabored. No stridor or wheezing Abdomen: Soft, nontender, nondistended to palpation. Incisions well-healed. No incisional hernias. Port easily palpated. Extremities: Atraumatic, good range of motion.  ASSESMENT: 47 y.o.  female  s/p AP-Standard lap-band.   PLAN: The patient's port was accessed with a 20G Huber needle without difficulty. Clear fluid was aspirated and 0.5 mL saline was added to the port to give a total predicted volume of 5 mL. The patient was able to swallow water without difficulty following the procedure and was instructed to take clear liquids for the next 24-48 hours and advance slowly as tolerated.

## 2012-04-12 NOTE — Patient Instructions (Signed)
Take clear liquids tonight. Thin protein shakes are ok to start tomorrow morning. Slowly advance your diet thereafter. Call us if you have persistent vomiting or regurgitation, night cough or reflux symptoms. Return as scheduled or sooner if you notice no changes in hunger/portion sizes.  

## 2012-05-01 ENCOUNTER — Ambulatory Visit (HOSPITAL_BASED_OUTPATIENT_CLINIC_OR_DEPARTMENT_OTHER): Payer: BC Managed Care – PPO | Admitting: Lab

## 2012-05-01 ENCOUNTER — Ambulatory Visit (HOSPITAL_BASED_OUTPATIENT_CLINIC_OR_DEPARTMENT_OTHER): Payer: BC Managed Care – PPO | Admitting: Oncology

## 2012-05-01 ENCOUNTER — Telehealth: Payer: Self-pay | Admitting: Oncology

## 2012-05-01 ENCOUNTER — Other Ambulatory Visit (HOSPITAL_BASED_OUTPATIENT_CLINIC_OR_DEPARTMENT_OTHER): Payer: BC Managed Care – PPO | Admitting: Lab

## 2012-05-01 VITALS — BP 134/88 | HR 79 | Temp 99.6°F | Resp 20 | Ht 67.0 in | Wt 232.3 lb

## 2012-05-01 DIAGNOSIS — Z170 Estrogen receptor positive status [ER+]: Secondary | ICD-10-CM

## 2012-05-01 DIAGNOSIS — C50919 Malignant neoplasm of unspecified site of unspecified female breast: Secondary | ICD-10-CM

## 2012-05-01 DIAGNOSIS — C50419 Malignant neoplasm of upper-outer quadrant of unspecified female breast: Secondary | ICD-10-CM

## 2012-05-01 DIAGNOSIS — M1289 Other specific arthropathies, not elsewhere classified, multiple sites: Secondary | ICD-10-CM

## 2012-05-01 LAB — CBC WITH DIFFERENTIAL/PLATELET
BASO%: 0.3 % (ref 0.0–2.0)
Eosinophils Absolute: 0.1 10*3/uL (ref 0.0–0.5)
HCT: 36.6 % (ref 34.8–46.6)
HGB: 12.2 g/dL (ref 11.6–15.9)
MCHC: 33.3 g/dL (ref 31.5–36.0)
MONO#: 0.4 10*3/uL (ref 0.1–0.9)
NEUT#: 5.2 10*3/uL (ref 1.5–6.5)
NEUT%: 75.3 % (ref 38.4–76.8)
WBC: 6.9 10*3/uL (ref 3.9–10.3)
lymph#: 1.2 10*3/uL (ref 0.9–3.3)

## 2012-05-01 LAB — COMPREHENSIVE METABOLIC PANEL (CC13)
ALT: 14 U/L (ref 0–55)
CO2: 27 mEq/L (ref 22–29)
Calcium: 9.2 mg/dL (ref 8.4–10.4)
Chloride: 105 mEq/L (ref 98–107)
Creatinine: 0.9 mg/dL (ref 0.6–1.1)
Glucose: 94 mg/dl (ref 70–99)
Total Protein: 7.4 g/dL (ref 6.4–8.3)

## 2012-05-01 NOTE — Telephone Encounter (Signed)
Pt sent back to lab and given appt schedule for August. Pt aware she will be contacted re appt w/Dr. Kendell Bane for new pt coord [phone]) and sent fax coversheet to HIM to send notes to office [phone]).

## 2012-05-01 NOTE — Progress Notes (Signed)
ID: [name] [name]   DOB: [birth year]  MR#: [id number]  WUJ#:[id number]  PCP: Neldon Labella, MD GYN: SU: OTHER MD: Wayland Denis,   HISTORY OF PRESENT ILLNESS: [name] [name] is a 47 year old [place] woman who underwent a yearly screening mammography in 02/11/2011. This showed heterogeneously dense tissue and a possible mass in the left breast. Additional views 03/21/2011 showed an area of amorphous calcifications in the upper central aspect of the left breast, measuring up to 3.0 cm. There was no associated mass. Biopsy was performed the same day and showed ([id number]) invasive ductal breast cancer, grade 1-2, with ductal carcinoma in situ, grade 1-3. The invasive tumor was 57% estrogen receptor positive, 97% progesterone receptor positive, with an MIB-1 of 10% and no HER-2 amplification.  With this information the patient proceeded to bilateral breast MRIs 03/31/2011 this showed a round enhancing mass with irregular margins measuring 6 mm, with an associated biopsy clip. There was additional clumped and linear enhancement of compatible with DCIS the total area measuring 3.1 cm. There were no other suspicious masses in either breast and there was no axillary or internal mammary adenopathy noted. Her subsequent history is as detailed below.  INTERVAL HISTORY: [name] [name] returns today for followup of her breast cancer. Since her last visit here she had a reduction mammoplasty on the right and some fat injections in the left breast.  REVIEW OF SYSTEMS: She is tolerating tamoxifen with no obvious side effects due to that medication. She still having regular periods. She had an episode of pain in the right arm and shoulder which lasted 2 days and was relieved with Naprosyn. Then in mid December she had pain beginning at the base of the left pinky eventually involving all the fingers of the left hand with swelling and erythema. She denies any trauma or exposures. [facility]'s [facility] and was sent  to an orthopedist, who ruled out gallops. She was given a brace to wear. About 7 or 8 days later of this had resolved on its own. She has occasional mild dry cough, occasional headaches related to sinus symptoms, but a detailed review of systems today was otherwise noncontributory  PAST MEDICAL HISTORY: Past Medical History  Diagnosis Date  . Status post biopsy 03/21/11    Breast, Left, Needle Core Biopsy, Upper Central - DCIS, Grade I-III with comedo Necrosis and Calcifications. ER+, PR+, Ki-67 10%, Her2- No Amplification  . Arthritis     Osteoarthritis Neck and Back  . Cancer     Left breast cancer  . Anemia     not at present  . S/P radiation therapy 06/13/2011 - 07/22/11    Left Breast/ 50 Gy in 25 Fractions: Left Breast Boost Boost/ 10 Gy in 5 Fractions  . DJD (degenerative joint disease)     Cervical and lumbar spin  . Hx of seizure disorder     Remote hx  . Seasonal allergies   She had a right breast biopsy remotely, which was benign. She has mild degenerative disc disease involving the cervical and lumbar spines. She status post lap band surgery November of 2010, and has lost 92 pounds so far. She has a history of seasonal allergies, hypertension which is improving eyes she continues to lose weight, history of postpartum migraines, resolved, history of seizure, remote, and is status post C-section x2 and dilatation and curettage times one   PAST SURGICAL HISTORY: Past Surgical History  Procedure Laterality Date  . Laparoscopic gastric banding  01/27/2009  . Cesarean section  1999, 2005  . Biopsy breast  08/07/09    Breast, Right Needle core Biopsy, [facility] - Pseudoangiomatous Stromal hyperplasia  . Breast biopsy  03/21/11  . Breast lumpectomy  04/19/11    snbx  . Breast lumpectomy  05/10/11    Left Breast Lumpectomy re-excision  . Dilation and curettage of uterus      X 1  . Breast reduction surgery  02/15/2012    Procedure: MAMMARY REDUCTION  (BREAST);  Surgeon: Wayland Denis, DO;   Location: [facility];  Service: Plastics;  Laterality: Right;  RIGHT BREAST MASTOPEXY    FAMILY HISTORY Family History  Problem Relation Age of Onset  . Stroke Mother     Stroke - mini  . Cancer Father     Lymphoma - passed age 38  The patient's father died at the age of 3 from sepsis in the setting of lymphoma. The patient's mother died at the age of 59 after a heart attack in the setting of multiple strokes. The patient had no brothers. She has 3 sisters. There is no history of breast or ovarian cancer in the family  GYNECOLOGIC HISTORY: Menarche age 37, first pregnancy to term age 12. She is GX P2. As noted above, she still having periods through tamoxifen, although they are slightly less frequent and briefer. 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 She is in process of separation, with a divorce pending. The patient has 36 and 3-year-old sons at home, with shared custody. She understands that in the absence of a formal divorced her husband will have healthcare power of attorney in case of a disabling event. She says this is not a problem for her. She attends [employer] locally    ADVANCED DIRECTIVES: not in place  HEALTH MAINTENANCE: History  Substance Use Topics  . Smoking status: Never Smoker   . Smokeless tobacco: Never Used  . Alcohol Use: Yes     Comment: rarely     Colonoscopy:  PAP:  Bone density:  Lipid panel:  No Known Allergies  Current Outpatient Prescriptions  Medication Sig Dispense Refill  . acetaminophen (TYLENOL) 325 MG tablet Take 650 mg by mouth every 6 (six) hours as needed.      . cetirizine (ZYRTEC) 10 MG tablet Take 10 mg by mouth as needed.       . citalopram (CELEXA) 10 MG tablet Take 10 mg by mouth daily.      . diclofenac (VOLTAREN) 0.1 % ophthalmic solution Place 1 drop into both eyes 4 (four) times daily as needed.      Marland Kitchen ibuprofen  (ADVIL,MOTRIN) 200 MG tablet Take 200 mg by mouth every 6 (six) hours as needed.      . IRON PO Take by mouth daily.      Marland Kitchen loratadine (CLARITIN) 10 MG tablet Take 10 mg by mouth daily.      . MULTIPLE VITAMIN PO Take 1 tablet by mouth.      . phenylephrine (SUDAFED PE) 10 MG TABS Take 10 mg by mouth every 4 (four) hours as needed.      . tamoxifen (NOLVADEX) 20 MG tablet Take 20 mg by mouth daily.       No current facility-administered medications for this visit.    OBJECTIVE: Middle-aged white woman in no acute distress Filed Vitals:   05/01/12 0913  BP: 134/88  Pulse: 79  Temp: 99.6 F (37.6 C)  Resp: 20     Body mass index is 36.37 kg/(m^2).    ECOG FS: 0  Sclerae unicteric Oropharynx clear No peripheral adenopathy Lungs no rales or rhonchi Heart regular rate and rhythm Abd benign MSK no focal spinal tenderness, no peripheral edema; currently there is no evidence of joint inflammation in the left hand, right shoulder, or elsewhere Neuro: nonfocal, well oriented, positive afect Breasts: The right breast is status post reduction mammoplasty. The cosmetic result is excellent. The left breast status post lumpectomy. There is some lumpiness and some irregularity in the superior aspect of it, which is going to be worked on a cosmetically, she tells me, with further of fatty tissue injections Skin: No rash is apparent  LAB RESULTS: Lab Results  Component Value Date   WBC 6.0 11/01/2011   NEUTROABS 3.7 11/01/2011   HGB 11.9* 02/15/2012   HCT 34.8 11/01/2011   MCV 80.6 11/01/2011   PLT 235 11/01/2011      Chemistry      Component Value Date/Time   NA 141 05/10/2011 1145   K 3.9 05/10/2011 1145   CL 104 05/10/2011 1145   CO2 31 04/18/2011 0830   BUN 8 05/10/2011 1145   CREATININE 0.80 05/10/2011 1145      Component Value Date/Time   CALCIUM 9.3 04/18/2011 0830   ALKPHOS 17* 04/05/2011 1331   AST 20 04/05/2011 1331   ALT 17 04/05/2011 1331   BILITOT 0.5 04/05/2011 1331       Lab  Results  Component Value Date   LABCA2 23 04/05/2011    No components found with this basename: LABCA125    No results found for this basename: INR,  in the last 168 hours  Urinalysis No results found for this basename: colorurine,  appearanceur,  labspec,  phurine,  glucoseu,  hgbur,  bilirubinur,  ketonesur,  proteinur,  urobilinogen,  nitrite,  leukocytesur   STUDIES: No results found. Mammography 02/14/2012 was unremarkable  ASSESSMENT: 47 y.o.  [name] [name] woman status post Left lumpectomy and sentinel lymph node sampling 04/19/2011 for a T2 N0, stage IIA invasive ductal carcinoma, grade 1 estrogen and progesterone receptor positive at 57% and 97% respectively, HER-2 negative, with an MIB-1 of 10. Her Oncotype Dx predicts a distant recurrence rate of 13% if her only systemic treatment is tamoxifen for 5 years.  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 I will see if Dr. Dierdre Forth can evaluate her. As far as breast cancer is concerned she is tolerating the tamoxifen well and the plan is to continue that for 5-10 years. She will see [place] again in 6 months. She knows to call for any problems that may develop before the next visit.    MAGRINAT,GUSTAV C    05/01/2012

## 2012-05-08 ENCOUNTER — Telehealth: Payer: Self-pay | Admitting: Oncology

## 2012-05-08 NOTE — Telephone Encounter (Signed)
Faxed pt medical records to Dr. Beekman °

## 2012-05-11 ENCOUNTER — Telehealth: Payer: Self-pay | Admitting: Oncology

## 2012-05-11 NOTE — Telephone Encounter (Signed)
Received fax confirmation from Dr. Shawnee Knapp office re appt for 3/3 @ 9am. S/w pt today she has been made aware of appts.

## 2012-05-24 ENCOUNTER — Ambulatory Visit (INDEPENDENT_AMBULATORY_CARE_PROVIDER_SITE_OTHER): Payer: BC Managed Care – PPO | Admitting: Surgery

## 2012-05-24 VITALS — BP 132/84 | HR 68 | Temp 98.0°F | Resp 18 | Ht 67.0 in | Wt 227.4 lb

## 2012-05-24 DIAGNOSIS — Z9884 Bariatric surgery status: Secondary | ICD-10-CM

## 2012-05-24 DIAGNOSIS — C50419 Malignant neoplasm of upper-outer quadrant of unspecified female breast: Secondary | ICD-10-CM

## 2012-05-24 DIAGNOSIS — C50412 Malignant neoplasm of upper-outer quadrant of left female breast: Secondary | ICD-10-CM

## 2012-05-24 NOTE — Progress Notes (Signed)
Re:   [name] [name]  "[name] [name]" DOB:   Oct 28, 1965 MRN:   [id number]  ASSESSMENT AND PLAN: 1.  Left breast cancer, 12 o'clock (central in breast)  Lumpectomy 04/19/2011.  (Re-excision of inferior margin 2/26/20213 - negative)  Final pathology - 3.2 cm cancer - T2, N0.  ER - 57%.  PR - 97%.  Sees Drs. Magrinat and Basilio Cairo.  Oncotype - 20.  Associated with 13% recurrence rate.    Patient declined genetics.  On Tamoxifen.  Some discussion about the arthralgias that she is having is related to Tamoxifen or not.  Dr. Salena Saner Sanger - mastopexy and fat grafting - 02/15/2012.  Disease free.  She will see me back in 6 months for breast check.  (see below for lap band follow up)  2.  Lap band - 01/27/2009.  Initial weight 273, BMI 42.9.  She is happy with weight loss.  Her port is prominent.  I added no fluid today.  She seems to be in the green zone.  Either Mardelle Matte or I will see her back in 3 month.s 3.  Hypertension. 4.  Osteoarthritis of back.  Better with weight loss. 5.  Arthralgias  Saw Dr. Dierdre Forth - he did not think that it is "migratory" arthritis (he did a genetic test which shows she is predisposed to arthritis) 5.  Migratory arthritis  Seeing Dr. Dierdre Forth  Chief Complaint  Patient presents with  . Routine Post Op    Breast biopsy 03/21/11   REFERRING PHYSICIAN: Neldon Labella, MD, MD  HISTORY OF PRESENT ILLNESS: [name] [name] is a 47 y.o. (DOB: October 28, 1965)  white female whose primary care physician is Neldon Labella, MD, MD and comes to me today for follow up of  left breast cancer and lap band.  From her breast, she is doing well.   She had a right breast mastopexy and left breast injection of fat by Dr. Salena Saner. Sanger - 02/15/2012.  She 's not quite as happy with the left breast as she had hoped.  It is also more lumpy post injection.  From the lap band, she is doing well.  She has lost weight since [place] added some fluid about 6 weeks.  And talking to her, it seems that her lap band is  properly constricting.  She drinks smoothies in the morning for breakfast.  I have recommended that she try some solid food.  Food does hang up occasionally when she eats too fast or too large a bite. She exercises 30 minutes 3 to 5 times/day. She does snack some through out the day - and I talked a little about that, but that is how she gets through.  It seems in talking to her that the lap band is at about the right amount, she has some restriction, and she has lost weight.  We agreed not to add any fluid and she will see me or Mardelle Matte back in 3 months.  Breast History: She had a right breast biopsy in 08/07/2009, which showed pseudoangiomatous stromal hyperplasia.   She had a screening mammogram 03/21/2011 that showed a 3 x 2.3 cm area of calcifications in the upper central left breast.  She underwent a core biopsy which showed an invasive ductal carcinoma.  She had a lumpectomy and left axillary SLNBx on 04/19/2011.  Unfortunately, her inferior margin is close.  But re-excision on 05/10/2011 was negative.    Path (side, TNM): Invasive ductal ca, left T2, N0 Surgery: Left lumpectomt  Date: - 04/19/2011 Size of  tumor: 3.2  (inferior margin 1 mm) Nodes: -/- ER: 57% PR: 97% Ki67: 10%  HER2Neu: Negative. Medical Oncologist: - Magrinat    Radiation Oncologist: Basilio Cairo  Past Medical History  Diagnosis Date  . Status post biopsy 03/21/11    Breast, Left, Needle Core Biopsy, Upper Central - DCIS, Grade I-III with comedo Necrosis and Calcifications. ER+, PR+, Ki-67 10%, Her2- No Amplification  . Arthritis     Osteoarthritis Neck and Back  . Cancer     Left breast cancer  . Anemia     not at present     Current Outpatient Prescriptions  Medication Sig Dispense Refill  . cetirizine (ZYRTEC) 10 MG tablet Take 10 mg by mouth as needed.       . diclofenac (VOLTAREN) 0.1 % ophthalmic solution Place 1 drop into both eyes 4 (four) times daily as needed.      . hydrochlorothiazide (MICROZIDE) 12.5 MG capsule  daily.       Marland Kitchen ibuprofen (ADVIL,MOTRIN) 200 MG tablet Take 200 mg by mouth every 6 (six) hours as needed.      . Loratadine 10 MG CAPS Take by mouth as needed.       . MULTIPLE VITAMIN PO Take 1 tablet by mouth.      . phenylephrine (SUDAFED PE) 10 MG TABS Take 10 mg by mouth every 4 (four) hours as needed.      . zolpidem (AMBIEN) 5 MG tablet Take 5 mg by mouth at bedtime as needed.         Allergies  Allergen Reactions  . Hydrocodone-Acetaminophen Other (See Comments)    Causes severe constipation. If pain medication is required, do not issue this type per patient request.   REVIEW OF SYSTEMS: Cardiac:  Hypertension, though she is only on diuretics. Musculoskeletal:  HIstory of back pain/osteroarthritis.  This is better since her weight loss surgery.  SOCIAL and FAMILY HISTORY: Separated. She works in Clinical biochemist at [employer].  PHYSICAL EXAM: BP 132/84  Pulse 68  Temp(Src) 97.8 F (36.6 C) (Temporal)  Resp 16  Ht 5\' 7"  (1.702 m)  Wt 214 lb 3.2 oz (97.16 kg)  BMI 33.55 kg/m2  LMP 04/19/2011  General: WN WF who is alert and generally healthy appearing.  HEENT: Normal. Pupils equal. Good dentition. Breasts:  Left: Transverse incision at 12 o'clock above nipple.  There is now lumpiness (about 4 nodules) varying from 1 to 2 cm in size around the scar.  I think this is all from the fat injection.  Her mammogram was done just before the fat injection, so her next mammogram is not until 02/2013.  Right:  Mastopexy scar. Lymph nodes:  Okay. Upper extremities:  No evidence of lymphedema.  DATA REVIEWED: Mammogram - [facility] - 02/14/2012 - negative (note: this was before the fat injection)  Ovidio Kin, MD,  [facility] [address] [place]  [address]   [place], [place]    [address] Phone:  [phone] FAX:  [phone]

## 2012-08-02 ENCOUNTER — Telehealth (INDEPENDENT_AMBULATORY_CARE_PROVIDER_SITE_OTHER): Payer: Self-pay

## 2012-08-02 NOTE — Telephone Encounter (Signed)
Appointment  with lap Band clinic 08/30/12 @ 4:30 Patient aware

## 2012-08-23 ENCOUNTER — Encounter (INDEPENDENT_AMBULATORY_CARE_PROVIDER_SITE_OTHER): Payer: BC Managed Care – PPO

## 2012-08-30 ENCOUNTER — Ambulatory Visit (INDEPENDENT_AMBULATORY_CARE_PROVIDER_SITE_OTHER): Payer: BC Managed Care – PPO | Admitting: Physician Assistant

## 2012-08-30 ENCOUNTER — Encounter (INDEPENDENT_AMBULATORY_CARE_PROVIDER_SITE_OTHER): Payer: Self-pay

## 2012-08-30 DIAGNOSIS — Z4651 Encounter for fitting and adjustment of gastric lap band: Secondary | ICD-10-CM

## 2012-08-30 NOTE — Progress Notes (Signed)
  HISTORY: [name] [name] is a 47 y.o.female who received an AP-Standard lap-band in November 2010 by Dr. Ezzard Standing. She comes in with stable weight since her last visit with Dr. Ezzard Standing. She feels like she's hit a plateau and wants to get more of this weight off. She describes being able to eat virtually anything she wants without significant restriction. She has no complaints of regurgitation or reflux. She would like a fill today to stay on track.  VITAL SIGNS: Filed Vitals:   08/30/12 1503  BP: 124/84  Pulse: 68  Temp: 98.4 F (36.9 C)  Resp: 16    PHYSICAL EXAM: Physical exam reveals a very well-appearing 47 y.o.female in no apparent distress Neurologic: Awake, alert, oriented Psych: Bright affect, conversant Respiratory: Breathing even and unlabored. No stridor or wheezing Abdomen: Soft, nontender, nondistended to palpation. Incisions well-healed. No incisional hernias. Port easily palpated. Extremities: Atraumatic, good range of motion.  ASSESMENT: 47 y.o.  female  s/p AP-Standard lap-band.   PLAN: The patient's port was accessed with a 20G Huber needle without difficulty. Clear fluid was aspirated and 0.25 mL saline was added to the port to give a total predicted volume of 5.25 mL. The patient was able to swallow water without difficulty following the procedure and was instructed to take clear liquids for the next 24-48 hours and advance slowly as tolerated.

## 2012-08-30 NOTE — Patient Instructions (Signed)
Take clear liquids tonight. Thin protein shakes are ok to start tomorrow morning. Slowly advance your diet thereafter. Call us if you have persistent vomiting or regurgitation, night cough or reflux symptoms. Return as scheduled or sooner if you notice no changes in hunger/portion sizes.  

## 2012-10-10 ENCOUNTER — Other Ambulatory Visit: Payer: Self-pay | Admitting: *Deleted

## 2012-10-10 DIAGNOSIS — C50919 Malignant neoplasm of unspecified site of unspecified female breast: Secondary | ICD-10-CM

## 2012-10-10 DIAGNOSIS — C50419 Malignant neoplasm of upper-outer quadrant of unspecified female breast: Secondary | ICD-10-CM

## 2012-10-10 MED ORDER — TAMOXIFEN CITRATE 20 MG PO TABS: 20.0000 mg | ORAL_TABLET | Freq: Every day | ORAL | Status: DC

## 2012-10-11 ENCOUNTER — Encounter (INDEPENDENT_AMBULATORY_CARE_PROVIDER_SITE_OTHER): Payer: BC Managed Care – PPO

## 2012-10-23 ENCOUNTER — Other Ambulatory Visit (HOSPITAL_BASED_OUTPATIENT_CLINIC_OR_DEPARTMENT_OTHER): Payer: BC Managed Care – PPO

## 2012-10-23 DIAGNOSIS — C50419 Malignant neoplasm of upper-outer quadrant of unspecified female breast: Secondary | ICD-10-CM

## 2012-10-23 LAB — COMPREHENSIVE METABOLIC PANEL (CC13)
ALT: 33 U/L (ref 0–55)
AST: 64 U/L — ABNORMAL HIGH (ref 5–34)
Albumin: 3.4 g/dL — ABNORMAL LOW (ref 3.5–5.0)
Alkaline Phosphatase: 14 U/L — ABNORMAL LOW (ref 40–150)
BUN: 9.7 mg/dL (ref 7.0–26.0)
CO2: 27 mEq/L (ref 22–29)
Calcium: 9.2 mg/dL (ref 8.4–10.4)
Chloride: 105 mEq/L (ref 98–109)
Creatinine: 0.9 mg/dL (ref 0.6–1.1)
Glucose: 90 mg/dl (ref 70–140)
Potassium: 4.4 mEq/L (ref 3.5–5.1)
Sodium: 142 mEq/L (ref 136–145)
Total Bilirubin: 0.32 mg/dL (ref 0.20–1.20)
Total Protein: 7.2 g/dL (ref 6.4–8.3)

## 2012-10-23 LAB — CBC WITH DIFFERENTIAL/PLATELET
BASO%: 0.4 % (ref 0.0–2.0)
Eosinophils Absolute: 0.1 10*3/uL (ref 0.0–0.5)
LYMPH%: 26.1 % (ref 14.0–49.7)
MONO#: 0.3 10*3/uL (ref 0.1–0.9)
NEUT#: 3.1 10*3/uL (ref 1.5–6.5)
Platelets: 252 10*3/uL (ref 145–400)
RBC: 4.09 10*6/uL (ref 3.70–5.45)
WBC: 4.7 10*3/uL (ref 3.9–10.3)
lymph#: 1.2 10*3/uL (ref 0.9–3.3)

## 2012-10-30 ENCOUNTER — Telehealth: Payer: Self-pay | Admitting: Oncology

## 2012-10-30 ENCOUNTER — Ambulatory Visit (HOSPITAL_BASED_OUTPATIENT_CLINIC_OR_DEPARTMENT_OTHER): Payer: BC Managed Care – PPO | Admitting: Oncology

## 2012-10-30 VITALS — BP 134/83 | HR 62 | Temp 98.3°F | Resp 18 | Ht 67.0 in | Wt 230.6 lb

## 2012-10-30 DIAGNOSIS — C50419 Malignant neoplasm of upper-outer quadrant of unspecified female breast: Secondary | ICD-10-CM

## 2012-10-30 DIAGNOSIS — Z170 Estrogen receptor positive status [ER+]: Secondary | ICD-10-CM

## 2012-10-30 DIAGNOSIS — C50919 Malignant neoplasm of unspecified site of unspecified female breast: Secondary | ICD-10-CM

## 2012-10-30 MED ORDER — TAMOXIFEN CITRATE 20 MG PO TABS: 20.0000 mg | ORAL_TABLET | Freq: Every day | ORAL | Status: DC

## 2012-10-30 NOTE — Progress Notes (Signed)
ID: [name]   DOB: 02/24/1966  MR#: [id number]  ION#:[id number]  PCP: Neldon Labella, MD GYN: SU: Ovidio Kin OTHER MD: Wayland Denis,   HISTORY OF PRESENT ILLNESS: [name] [name] is a 47 year old [place] woman who underwent a yearly screening mammography in 02/11/2011. This showed heterogeneously dense tissue and a possible mass in the left breast. Additional views 03/21/2011 showed an area of amorphous calcifications in the upper central aspect of the left breast, measuring up to 3.0 cm. There was no associated mass. Biopsy was performed the same day and showed ([id number]) invasive ductal breast cancer, grade 1-2, with ductal carcinoma in situ, grade 1-3. The invasive tumor was 57% estrogen receptor positive, 97% progesterone receptor positive, with an MIB-1 of 10% and no HER-2 amplification.  With this information the patient proceeded to bilateral breast MRIs 03/31/2011 this showed a round enhancing mass with irregular margins measuring 6 mm, with an associated biopsy clip. There was additional clumped and linear enhancement of compatible with DCIS the total area measuring 3.1 cm. There were no other suspicious masses in either breast and there was no axillary or internal mammary adenopathy noted. Her subsequent history is as detailed below.  INTERVAL HISTORY: [name] returns today for followup of her breast cancer. The interval history is significant for her continuing to be dissatisfied with her left breast reconstruction. She tells me this will be her fourth surgery and she is planning to have an implant placed because there is a very large dimple superiorly in the breast.  REVIEW OF SYSTEMS: Otherwise she is doing "good". Work and family are fine, and she has remarried since her last visit here (the social history is updated below). And she is exercising by walking a couple days a week in exercising with a trainer 2 days a week. She still having periods irregularly. There is no vaginal  wetness or dryness, and no hot flashes from the tamoxifen. A detailed review of systems today was otherwise negative except for mild seasonal allergies and mild heartburn issues  PAST MEDICAL HISTORY: Past Medical History  Diagnosis Date  . Status post biopsy 03/21/11    Breast, Left, Needle Core Biopsy, Upper Central - DCIS, Grade I-III with comedo Necrosis and Calcifications. ER+, PR+, Ki-67 10%, Her2- No Amplification  . Arthritis     Osteoarthritis Neck and Back  . Cancer     Left breast cancer  . Anemia     not at present  . S/P radiation therapy 06/13/2011 - 07/22/11    Left Breast/ 50 Gy in 25 Fractions: Left Breast Boost Boost/ 10 Gy in 5 Fractions  . DJD (degenerative joint disease)     Cervical and lumbar spin  . Hx of seizure disorder     Remote hx  . Seasonal allergies   She had a right breast biopsy remotely, which was benign. She has mild degenerative disc disease involving the cervical and lumbar spines. She status post lap band surgery November of 2010, and has lost 92 pounds so far. She has a history of seasonal allergies, hypertension which is improving eyes she continues to lose weight, history of postpartum migraines, resolved, history of seizure, remote, and is status post C-section x2 and dilatation and curettage times one   PAST SURGICAL HISTORY: Past Surgical History  Procedure Laterality Date  . Laparoscopic gastric banding  01/27/2009  . Cesarean section  1999, 2005  . Biopsy breast  08/07/09    Breast, Right Needle core Biopsy, UOQ - Pseudoangiomatous Stromal hyperplasia  .  Breast biopsy  03/21/11  . Breast lumpectomy  04/19/11    snbx  . Breast lumpectomy  05/10/11    Left Breast Lumpectomy re-excision  . Dilation and curettage of uterus      X 1  . Breast reduction surgery  02/15/2012    Procedure: MAMMARY REDUCTION  (BREAST);  Surgeon: Wayland Denis, DO;  Location: [facility];  Service: Plastics;  Laterality: Right;  RIGHT BREAST MASTOPEXY     FAMILY HISTORY Family History  Problem Relation Age of Onset  . Stroke Mother     Stroke - mini  . Cancer Father     Lymphoma - passed age 59  The patient's father died at the age of 28 from sepsis in the setting of lymphoma. The patient's mother died at the age of 41 after a heart attack in the setting of multiple strokes. The patient had no brothers. She has 3 sisters. There is no history of breast or ovarian cancer in the family  GYNECOLOGIC HISTORY: Menarche age 79, first pregnancy to term age 50. She is GX P2. As noted above, she still having periods through tamoxifen, although they are slightly less frequent and briefer. 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 She divorced her first husband, with 31 and 51-year-old sons at home, with shared custody. She married Errin Whitelaw in 2014. He works as a Dentist. She has 2 daughters from a prior marriage, age 48 and 64. She attends [employer] locally    ADVANCED DIRECTIVES: not in place  HEALTH MAINTENANCE: History  Substance Use Topics  . Smoking status: Never Smoker   . Smokeless tobacco: Never Used  . Alcohol Use: Yes     Comment: rarely     Colonoscopy:  PAP:  Bone density:  Lipid panel:  No Known Allergies  Current Outpatient Prescriptions  Medication Sig Dispense Refill  . acetaminophen (TYLENOL) 325 MG tablet Take 650 mg by mouth every 6 (six) hours as needed.      . cetirizine (ZYRTEC) 10 MG tablet Take 10 mg by mouth as needed.       . citalopram (CELEXA) 10 MG tablet Take 10 mg by mouth daily.      . diclofenac (VOLTAREN) 0.1 % ophthalmic solution Place 1 drop into both eyes 4 (four) times daily as needed.      Marland Kitchen ibuprofen (ADVIL,MOTRIN) 200 MG tablet Take 200 mg by mouth every 6 (six) hours as needed.      . loratadine (CLARITIN) 10 MG tablet Take 10 mg by mouth daily.      . MULTIPLE VITAMIN PO Take 1 tablet  by mouth.      . phenylephrine (SUDAFED PE) 10 MG TABS Take 10 mg by mouth every 4 (four) hours as needed.      . tamoxifen (NOLVADEX) 20 MG tablet Take 1 tablet (20 mg total) by mouth daily.  90 tablet  1   No current facility-administered medications for this visit.    OBJECTIVE: Middle-aged white woman who appears well Filed Vitals:   10/30/12 0851  BP: 134/83  Pulse: 62  Temp: 98.3 F (36.8 C)  Resp: 18     Body mass index is 36.1 kg/(m^2).    ECOG FS: 0  Sclerae unicteric, pupils equal round and reactive to light Oropharynx clear No peripheral adenopathy Lungs no rales or rhonchi Heart regular rate and rhythm Abd soft, obese,  nontender, positive bowel sounds MSK no focal spinal tenderness, no peripheral edema; currently there is no evidence of joint inflammation in the left hand, right shoulder, or elsewhere Neuro: nonfocal, well oriented, positive afect Breasts: The right breast is status post reduction mammoplasty. The cosmetic result is excellent. The left breast status post lumpectomy and radiation. There is an irregular depression in the superior aspect of it, which was not corrected with "fatty tissue injections". There is no evidence of local recurrence. The left axilla is benign.   LAB RESULTS: Lab Results  Component Value Date   WBC 4.7 10/23/2012   NEUTROABS 3.1 10/23/2012   HGB 11.8 10/23/2012   HCT 35.6 10/23/2012   MCV 87.0 10/23/2012   PLT 252 10/23/2012      Chemistry      Component Value Date/Time   NA 142 10/23/2012 0808   NA 141 05/10/2011 1145   K 4.4 10/23/2012 0808   K 3.9 05/10/2011 1145   CL 105 05/01/2012 1044   CL 104 05/10/2011 1145   CO2 27 10/23/2012 0808   CO2 31 04/18/2011 0830   BUN 9.7 10/23/2012 0808   BUN 8 05/10/2011 1145   CREATININE 0.9 10/23/2012 0808   CREATININE 0.80 05/10/2011 1145      Component Value Date/Time   CALCIUM 9.2 10/23/2012 0808   CALCIUM 9.3 04/18/2011 0830   ALKPHOS 14* 10/23/2012 0808   ALKPHOS 17* 04/05/2011 1331   AST  64* 10/23/2012 0808   AST 20 04/05/2011 1331   ALT 33 10/23/2012 0808   ALT 17 04/05/2011 1331   BILITOT 0.32 10/23/2012 0808   BILITOT 0.5 04/05/2011 1331       Lab Results  Component Value Date   LABCA2 23 04/05/2011    No components found with this basename: [id number]    No results found for this basename: INR,  in the last 168 hours  Urinalysis No results found for this basename: colorurine,  appearanceur,  labspec,  phurine,  glucoseu,  hgbur,  bilirubinur,  ketonesur,  proteinur,  urobilinogen,  nitrite,  leukocytesur   STUDIES: Mammography 02/14/2012 was unremarkable  ASSESSMENT: 47 y.o.  [place] woman status post Left lumpectomy and sentinel lymph node sampling 04/19/2011 for a T2 N0, stage IIA invasive ductal carcinoma, grade 1, estrogen and progesterone receptor positive at 57% and 97% respectively, HER-2 negative, with an MIB-1 of 10.  (1) Her Oncotype Dx predicts a distant recurrence rate of 13% if her only systemic treatment is tamoxifen for 5 years.  (2) completed radiation 07/22/2011  (3) started tamoxifen May 2013   PLAN:  [name] thought that tamoxifen causes own density loss, but the opposite is the case. She was reassured regarding that today. I am not sure why her AST is slightly up. Her ALT remains normal. The most common cause of this may be occasional alcohol use. The second most common cause would be fatty liver. If there is progressive rise then we could consider a liver ultrasound to document that this is benign, but at present this requires only followup.  She will be seeing Dr. Ezzard Standing in February, she tells me, after her mammogram in December. She will return to see [place] a year from now. We will continue to follow her on a yearly basis until she completes her 10 years of tamoxifen. She knows to call for any problems that may develop before next visit here.  Heyden Jaber C    10/30/2012

## 2013-04-03 ENCOUNTER — Other Ambulatory Visit: Payer: Self-pay | Admitting: Obstetrics and Gynecology

## 2013-04-03 DIAGNOSIS — Z853 Personal history of malignant neoplasm of breast: Secondary | ICD-10-CM

## 2013-04-23 ENCOUNTER — Ambulatory Visit
Admission: RE | Admit: 2013-04-23 | Discharge: 2013-04-23 | Disposition: A | Discharge status: Home / Self Care | Payer: Self-pay | Source: Ambulatory Visit | Attending: Obstetrics and Gynecology | Admitting: Obstetrics and Gynecology

## 2013-04-23 DIAGNOSIS — Z853 Personal history of malignant neoplasm of breast: Secondary | ICD-10-CM

## 2013-05-16 ENCOUNTER — Encounter (INDEPENDENT_AMBULATORY_CARE_PROVIDER_SITE_OTHER): Payer: Self-pay | Admitting: Physician Assistant

## 2013-05-16 ENCOUNTER — Ambulatory Visit (INDEPENDENT_AMBULATORY_CARE_PROVIDER_SITE_OTHER): Payer: BC Managed Care – PPO | Admitting: Physician Assistant

## 2013-05-16 VITALS — BP 138/76 | HR 76 | Temp 97.2°F | Resp 18 | Ht 67.0 in | Wt 235.0 lb

## 2013-05-16 DIAGNOSIS — Z9884 Bariatric surgery status: Secondary | ICD-10-CM

## 2013-05-16 DIAGNOSIS — K219 Gastro-esophageal reflux disease without esophagitis: Secondary | ICD-10-CM

## 2013-05-16 NOTE — Patient Instructions (Signed)
Obtain your Upper GI x-ray. Return to see [place] in six weeks. Return sooner should you have worsening symptoms, especially increasing reflux despite your medication or if you have difficulty swallowing solids.

## 2013-05-16 NOTE — Progress Notes (Addendum)
  HISTORY: Cherita Hebel is a 48 y.o.female who received an AP-Standard lap-band in November 2010 by Dr. Lucia Gaskins. She comes in with 6 lbs weight gain since her last visit in June 2014. She describes having reflux symptoms since the fill. She took OTC Pantoprazole with good relief but began having recurrence not long thereafter. She was recently prescribed a PPI and has had relief with this as well. She denies solid food dysphagia but she's concerned about irritation from the reflux she's been having. She worries that her weight gain is due to eating too much but she reports that her portion sizes haven't changed at all. She is able to occasionally eat a bite or two of bread without regurgitation. She has not been able to engage in much physical activity due to her schedule.  VITAL SIGNS: Filed Vitals:   05/16/13 1026  BP: 138/76  Pulse: 76  Temp: 97.2 F (36.2 C)  Resp: 18    PHYSICAL EXAM: Physical exam reveals a very well-appearing 48 y.o.female in no apparent distress Neurologic: Awake, alert, oriented Psych: Bright affect, conversant Respiratory: Breathing even and unlabored. No stridor or wheezing Extremities: Atraumatic, good range of motion. Skin: Warm, Dry, no rashes Musculoskeletal: Normal gait, Joints normal Abdomen: Soft, nontender, nondistended. Port easily palpated, no redness.  ASSESMENT: 49 y.o.  female  s/p AP-Standard lap-band.   PLAN: I'm scheduling an Upper GI to evaluate her band and level of restriction. I asked her to remain on her PPI as prescribed. If there is no structural abnormality and contrast flows well, we'll consider a fill at her next visit. If her symptoms worsen, I asked her to return sooner. She voiced understanding and agreement.  UGI on 05/22/2013 - showed delayed emptying of esophagus through the lap band.  It is still probably too tight.  DN 05/30/2103

## 2013-05-22 ENCOUNTER — Ambulatory Visit
Admission: RE | Admit: 2013-05-22 | Discharge: 2013-05-22 | Disposition: A | Discharge status: Home / Self Care | Payer: BC Managed Care – PPO | Source: Ambulatory Visit | Attending: Physician Assistant | Admitting: Physician Assistant

## 2013-05-22 DIAGNOSIS — K219 Gastro-esophageal reflux disease without esophagitis: Secondary | ICD-10-CM

## 2013-05-22 DIAGNOSIS — Z9884 Bariatric surgery status: Secondary | ICD-10-CM

## 2013-06-11 ENCOUNTER — Ambulatory Visit (INDEPENDENT_AMBULATORY_CARE_PROVIDER_SITE_OTHER): Payer: BC Managed Care – PPO | Admitting: Surgery

## 2013-06-27 ENCOUNTER — Ambulatory Visit (INDEPENDENT_AMBULATORY_CARE_PROVIDER_SITE_OTHER): Payer: BC Managed Care – PPO | Admitting: Physician Assistant

## 2013-06-27 ENCOUNTER — Encounter (INDEPENDENT_AMBULATORY_CARE_PROVIDER_SITE_OTHER): Payer: Self-pay

## 2013-06-27 VITALS — BP 128/82 | HR 70 | Temp 97.6°F | Resp 16 | Ht 67.0 in | Wt 237.0 lb

## 2013-06-27 DIAGNOSIS — Z4651 Encounter for fitting and adjustment of gastric lap band: Secondary | ICD-10-CM

## 2013-06-27 NOTE — Progress Notes (Signed)
  HISTORY: [name] [name] is a 48 y.o.female who received an AP-Standard lap-band in November 2010 by Dr. Lucia Gaskins. She comes in with 2 lbs weight gain since her last visit in March. We scheduled an upper GI which revealed a tight band. This explains her persistent GERD despite taking PPIs. She's had two episodes of nocturnal regurgitation in the past week. Solids are difficult to get down, and those that are tolerated are typical slider foods. She would like some fluid removed to help get nutritious food down.  VITAL SIGNS: Filed Vitals:   06/27/13 0833  BP: 128/82  Pulse: 70  Temp: 97.6 F (36.4 C)  Resp: 16    PHYSICAL EXAM: Physical exam reveals a very well-appearing 48 y.o.female in no apparent distress Neurologic: Awake, alert, oriented Psych: Bright affect, conversant Respiratory: Breathing even and unlabored. No stridor or wheezing Abdomen: Soft, nontender, nondistended to palpation. Incisions well-healed. No incisional hernias. Port easily palpated. Extremities: Atraumatic, good range of motion.  ASSESMENT: 48 y.o.  female  s/p AP-Standard lap-band, with reflux and solid food dysphagia.  PLAN: The patient's port was accessed with a 20G Huber needle without difficulty. Clear fluid was aspirated and 1 mL saline was removed from the port to give a total predicted volume of 4.25 mL. The patient was advised to concentrate on healthy food choices and to avoid slider foods high in fats and carbohydrates. She was able to swallow water without difficulty. We'll have her back in one month. I asked her to call us if she still had obstructive symptoms despite today's measures.

## 2013-06-27 NOTE — Patient Instructions (Signed)

## 2013-07-03 ENCOUNTER — Encounter (INDEPENDENT_AMBULATORY_CARE_PROVIDER_SITE_OTHER): Payer: Self-pay | Admitting: Surgery

## 2013-07-03 ENCOUNTER — Ambulatory Visit (INDEPENDENT_AMBULATORY_CARE_PROVIDER_SITE_OTHER): Payer: BC Managed Care – PPO | Admitting: Surgery

## 2013-07-03 VITALS — BP 124/72 | HR 67 | Temp 98.2°F | Resp 14 | Ht 67.0 in | Wt 235.4 lb

## 2013-07-03 DIAGNOSIS — C50419 Malignant neoplasm of upper-outer quadrant of unspecified female breast: Secondary | ICD-10-CM

## 2013-07-03 NOTE — Progress Notes (Signed)
Re:   [name] [name] "[name] [name]" DOB:   [birth year] MRN:   [id number]  ASSESSMENT AND PLAN: 1.  Left breast cancer, 12 o'clock (central in breast)  Lumpectomy 04/19/2011.  (Re-excision of inferior margin 2/26/20213 - negative)  Final pathology - 3.2 cm cancer - T2, N0.  ER - 57%.  PR - 97%.  Sees Drs. [name] [name] and [name] [name].  Oncotype - 20.  Associated with 13% recurrence rate.    Patient declined genetics.  On Tamoxifen.  Some discussion about the arthralgias that she is having.  Unclear if it is related to Tamoxifen or not.  Dr. Loletha Grayer [name] - mastopexy and fat grafting - 02/15/2012.  The results were not as good as she was hoping.  Disease free.  She will see me back in 1 year.  I am alternating with Dr. Jana [name].  (see below for lap band follow up)  2.  Lap band - 01/27/2009.  Initial weight 273, BMI 42.9.  The lap band was too tight, but [name] [name] removed some fluid last week, and she is doing much better.  She is to see [name] [name] back in about one month. 3.  Hypertension. 4.  Osteoarthritis of back.  Better with weight loss. 5.  Arthralgias  Saw Dr. Amil [name] - he did not think that it is "migratory" arthritis (he did a genetic test which shows she is predisposed to arthritis) - she saw him once, but is not following up with her.  Chief Complaint  Patient presents with  . Routine Post Op    Breast biopsy 03/21/11   REFERRING PHYSICIAN: Tawanna Solo, MD, MD  HISTORY OF PRESENT ILLNESS: [name] [name] is a 48 y.o. (DOB: 02-18-1966)  white female whose primary care physician is [name] Solo, MD, MD and comes to me today for follow up of  left breast cancer and lap band. She comes by herself.  From her breast, she is doing well.   She had a negative mammogram on 04/23/2013.  She still has the arthralgias. She is doing better with her lap band since [name] [name] removed the fluid last week. Since she saw [name] [name] last week, I did not charge her for this visit.  Breast History: She had a right breast biopsy  in 08/07/2009, which showed pseudoangiomatous stromal hyperplasia.   She had a screening mammogram 03/21/2011 that showed a 3 x 2.3 cm area of calcifications in the upper central left breast.  She underwent a core biopsy which showed an invasive ductal carcinoma.  She had a lumpectomy and left axillary SLNBx on 04/19/2011.  Unfortunately, her inferior margin is close.  But re-excision on 05/10/2011 was negative.    Path (side, TNM): Invasive ductal ca, left T2, N0 Surgery: Left lumpectomt  Date: - 04/19/2011 Size of tumor: 3.2  (inferior margin 1 mm) Nodes: -/- ER: 57% PR: 97% Ki67: 10%  HER2Neu: Negative. Medical Oncologist: - [place]    Radiation Oncologist: [name] [name]  Past Medical History  Diagnosis Date  . Status post biopsy 03/21/11    Breast, Left, Needle Core Biopsy, Upper Central - DCIS, Grade I-III with comedo Necrosis and Calcifications. ER+, PR+, Ki-67 10%, Her2- No Amplification  . Arthritis     Osteoarthritis Neck and Back  . Cancer     Left breast cancer  . Anemia     not at present     Current Outpatient Prescriptions  Medication Sig Dispense Refill  . cetirizine (ZYRTEC) 10 MG tablet Take 10 mg by mouth as needed.       Marland Kitchen  diclofenac (VOLTAREN) 0.1 % ophthalmic solution Place 1 drop into both eyes 4 (four) times daily as needed.      . hydrochlorothiazide (MICROZIDE) 12.5 MG capsule daily.       Marland Kitchen ibuprofen (ADVIL,MOTRIN) 200 MG tablet Take 200 mg by mouth every 6 (six) hours as needed.      . Loratadine 10 MG CAPS Take by mouth as needed.       . MULTIPLE VITAMIN PO Take 1 tablet by mouth.      . phenylephrine (SUDAFED PE) 10 MG TABS Take 10 mg by mouth every 4 (four) hours as needed.      . zolpidem (AMBIEN) 5 MG tablet Take 5 mg by mouth at bedtime as needed.         Allergies  Allergen Reactions  . Hydrocodone-Acetaminophen Other (See Comments)    Causes severe constipation. If pain medication is required, do not issue this type per patient request.   REVIEW OF  SYSTEMS: Cardiac:  Hypertension, though she is only on diuretics. Musculoskeletal:  HIstory of back pain/osteroarthritis.  This is better since her weight loss surgery.  SOCIAL and FAMILY HISTORY: Remarried in May 2014 - new last name.  (Her last name was [name] [name]) She works in Therapist, art at [employer].  PHYSICAL EXAM: BP 132/84  Pulse 68  Temp(Src) 97.8 F (36.6 C) (Temporal)  Resp 16  Ht '5\' 7"'  (1.702 m)  Wt 214 lb 3.2 oz (97.16 kg)  BMI 33.55 kg/m2  LMP 04/19/2011  General: WN WF who is alert and generally healthy appearing.  HEENT: Normal. Pupils equal. Good dentition. Breasts:  Left: Transverse incision at 12 o'clock above nipple.  Some loss of tissue. She had the fat injection by Dr. Migdalia [name], but it does not look like much took.  She has no specific mass or nodule.  Right:  Mastopexy scar.  No mass. Lymph nodes:  Okay. Upper extremities:  No evidence of lymphedema. Abdomen:  Port in RUQ.  DATA REVIEWED: Mammogram - [facility] - 04/23/2013 - negative mammogram  Alphonsa Overall, MD,  [facility], [address]  [place], [place]    [place] Phone:  [phone] FAX:  [phone]

## 2013-07-25 ENCOUNTER — Encounter (INDEPENDENT_AMBULATORY_CARE_PROVIDER_SITE_OTHER): Payer: Self-pay

## 2013-07-25 ENCOUNTER — Ambulatory Visit (INDEPENDENT_AMBULATORY_CARE_PROVIDER_SITE_OTHER): Payer: BC Managed Care – PPO | Admitting: Physician Assistant

## 2013-07-25 VITALS — BP 130/76 | HR 68 | Temp 98.6°F | Resp 14 | Ht 67.0 in | Wt 241.0 lb

## 2013-07-25 DIAGNOSIS — Z9884 Bariatric surgery status: Secondary | ICD-10-CM

## 2013-07-25 NOTE — Progress Notes (Signed)
  HISTORY: [name] [name] is a 48 y.o.female who received an AP-Standard lap-band in November 2010 by Dr. Lucia Gaskins. She comes in with 4 lbs of weight gain since her last visit one month ago. One mL fluid was removed at that time for obstructive symptoms and radiographic evidence of over-restriction. She reports being able to eat without difficulty now. She is able to eat eggs which gave her significant trouble before. She does complain of edema, worse in the afternoon. She is being careful to keep her Na level below 2.4g per day. She has scheduled an appointment with her PCP to evaluate her edema. She reports her BP has been under good control.  VITAL SIGNS: Filed Vitals:   07/25/13 0846  BP: 130/76  Pulse: 68  Temp: 98.6 F (37 C)  Resp: 14    PHYSICAL EXAM: Physical exam reveals a very well-appearing 48 y.o.female in no apparent distress Neurologic: Awake, alert, oriented Psych: Bright affect, conversant Respiratory: Breathing even and unlabored. No stridor or wheezing Extremities: Atraumatic, good range of motion. Skin: Warm, Dry, no rashes Musculoskeletal: Normal gait, Joints normal. 1+ bilateral LE pitting edema. This is measured at 9 am.  ASSESMENT: 48 y.o.  female  s/p AP-Standard lap-band.   PLAN: As she is having some issues with allergies, she is concerned that a fill today would cause obstructive symptoms yet again. She is pleased with her level of restriction, so she'd like to defer a fill for now. We'll set her up for an appointment in two months. She will call us sooner if there are any problems.

## 2013-07-25 NOTE — Patient Instructions (Signed)
Return in two months. Focus on good food choices as well as physical activity. Return sooner if you have an increase in hunger, portion sizes or weight. Return also for difficulty swallowing, night cough, reflux.   

## 2013-09-26 ENCOUNTER — Encounter (INDEPENDENT_AMBULATORY_CARE_PROVIDER_SITE_OTHER): Payer: Self-pay

## 2013-09-26 ENCOUNTER — Ambulatory Visit (INDEPENDENT_AMBULATORY_CARE_PROVIDER_SITE_OTHER): Payer: BC Managed Care – PPO | Admitting: Physician Assistant

## 2013-09-26 VITALS — BP 140/98 | HR 72 | Temp 99.3°F | Resp 14 | Ht 67.0 in | Wt 251.8 lb

## 2013-09-26 DIAGNOSIS — Z4651 Encounter for fitting and adjustment of gastric lap band: Secondary | ICD-10-CM

## 2013-09-26 NOTE — Progress Notes (Signed)
  HISTORY: [name] [name] is a 48 y.o.female who received an AP-Standard lap-band in November 2010 by Dr. Lucia Gaskins. The patient has gained 11 lbs since their last visit in May, and has lost 21 lbs since surgery. She describes mostly good days but this usually results in eating more than desired. Her 'bad days' are usually when she has increased sinus/nasal drainage from allergies and this usually results in some degree of over-restriction. She is also focusing on reducing her snacking, which is worse at night before bed. She describes eating more carbs than needed as well. She has seen her PCP recently who diagnosed her with anemia, for which she is taking Iron supplements now. She describes her energy level as very low. She continues to exercise at least three days a week. She thinks a fill would be helpful to get some more weight off (1 mL was removed in April for dysphagia).  VITAL SIGNS: Filed Vitals:   09/26/13 0823  BP: 140/98  Pulse: 72  Temp: 99.3 F (37.4 C)  Resp: 14    PHYSICAL EXAM: Physical exam reveals a very well-appearing 48 y.o.female in no apparent distress Neurologic: Awake, alert, oriented Psych: Bright affect, conversant Respiratory: Breathing even and unlabored. No stridor or wheezing Abdomen: Soft, nontender, nondistended to palpation. Incisions well-healed. No incisional hernias. Port easily palpated. Extremities: Atraumatic, good range of motion.  ASSESMENT: 48 y.o.  female  s/p AP-Standard lap-band.   PLAN: The patient's port was accessed with a 20G Huber needle without difficulty. Clear fluid was aspirated and 0.5 mL saline was added to the port to give a total predicted volume of 4.75 mL. The patient was able to swallow water without difficulty following the procedure and was instructed to take clear liquids for the next 24-48 hours and advance slowly as tolerated. I encouraged her to decrease carbs and to watch her snacking. Increase proteins as a way to control  hunger. We'll have her return in three months.

## 2013-09-26 NOTE — Patient Instructions (Signed)

## 2013-10-24 ENCOUNTER — Other Ambulatory Visit: Payer: Self-pay | Admitting: *Deleted

## 2013-10-24 DIAGNOSIS — C50419 Malignant neoplasm of upper-outer quadrant of unspecified female breast: Secondary | ICD-10-CM

## 2013-10-24 DIAGNOSIS — C50919 Malignant neoplasm of unspecified site of unspecified female breast: Secondary | ICD-10-CM

## 2013-10-24 MED ORDER — TAMOXIFEN CITRATE 20 MG PO TABS: 20.0000 mg | ORAL_TABLET | Freq: Every day | ORAL | Status: DC

## 2013-10-29 ENCOUNTER — Telehealth: Payer: Self-pay | Admitting: Nurse Practitioner

## 2013-10-29 ENCOUNTER — Encounter: Payer: Self-pay | Admitting: Nurse Practitioner

## 2013-10-29 ENCOUNTER — Ambulatory Visit (HOSPITAL_BASED_OUTPATIENT_CLINIC_OR_DEPARTMENT_OTHER): Payer: BC Managed Care – PPO | Admitting: Nurse Practitioner

## 2013-10-29 VITALS — BP 151/84 | HR 57 | Temp 98.7°F | Resp 19 | Ht 67.0 in | Wt 255.1 lb

## 2013-10-29 DIAGNOSIS — C50412 Malignant neoplasm of upper-outer quadrant of left female breast: Secondary | ICD-10-CM

## 2013-10-29 DIAGNOSIS — Z170 Estrogen receptor positive status [ER+]: Secondary | ICD-10-CM

## 2013-10-29 DIAGNOSIS — C50919 Malignant neoplasm of unspecified site of unspecified female breast: Secondary | ICD-10-CM

## 2013-10-29 DIAGNOSIS — D649 Anemia, unspecified: Secondary | ICD-10-CM

## 2013-10-29 DIAGNOSIS — R232 Flushing: Secondary | ICD-10-CM | POA: Insufficient documentation

## 2013-10-29 NOTE — Progress Notes (Signed)
ID: [name] [name]   DOB: 15-Jun-1965  MR#: [id number]  BMW#:[id number]  PCP: Tawanna Solo, MD GYN: SU: Alphonsa Overall OTHER MD: Theodoro Kos,   CHIEF COMPLAINT: left breast cancer  CURRENT TREATMENT: tamoxifen 54m daily  BREAST CANCER HISTORY: VNirvi"[name] [name] [name] a 48year old [place] who underwent a yearly screening mammography in 02/11/2011. This showed heterogeneously dense tissue and a possible mass in the left breast. Additional views 03/21/2011 showed an area of amorphous calcifications in the upper central aspect of the left breast, measuring up to 3.0 cm. There was no associated mass. Biopsy was performed the same day and showed (([id number] invasive ductal breast cancer, grade 1-2, with ductal carcinoma in situ, grade 1-3. The invasive tumor was 57% estrogen receptor positive, 97% progesterone receptor positive, with an MIB-1 of 10% and no HER-2 amplification.  With this information the patient proceeded to bilateral breast MRIs 03/31/2011 this showed a round enhancing mass with irregular margins measuring 6 mm, with an associated biopsy clip. There was additional clumped and linear enhancement of compatible with DCIS the total area measuring 3.1 cm. There were no other suspicious masses in either breast and there was no axillary or internal mammary adenopathy noted. Her subsequent history is as detailed below.  INTERVAL HISTORY:  [name] Pesareturns today for follow up of her breast cancer. She has been on tamoxifen since May 2013. She is on celexa which is helping her hot flashes, they occur only rarely now. She denies any vaginal changes. 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 This is controlled with NSAIDS. The interval history is positive for microscopic tears in her left eye. She was prescribed 3 different eye drops for this condition by an ophthalmologist and is expected to  recover well. [name] has her menstrual cycle, although it is irregular and she states she has missed 2 cycles this year. She is exercising nearly daily, alternating between walking and weight lifting. She is concerned about her weight gain, estimating a 10lb weight gain per year but knows that the tamoxifen is not at fault.   REVIEW OF SYSTEMS: A detailed review of systems was otherwise negative except as noted above.   PAST MEDICAL HISTORY: Past Medical History  Diagnosis Date  . Status post biopsy 03/21/11    Breast, Left, Needle Core Biopsy, Upper Central - DCIS, Grade I-III with comedo Necrosis and Calcifications. ER+, PR+, Ki-67 10%, Her2- No Amplification  . Arthritis     Osteoarthritis Neck and Back  . Cancer     Left breast cancer  . Anemia     not at present  . S/P radiation therapy 06/13/2011 - 07/22/11    Left Breast/ 50 Gy in 25 Fractions: Left Breast Boost Boost/ 10 Gy in 5 Fractions  . DJD (degenerative joint disease)     Cervical and lumbar spin  . Hx of seizure disorder     Remote hx  . Seasonal allergies   She had a right breast biopsy remotely, which was benign. She has mild degenerative disc disease involving the cervical and lumbar spines. She status post lap band surgery November of 2010, and has lost 92 pounds so far. She has a history of seasonal allergies, hypertension which is improving eyes she continues to lose weight, history of postpartum migraines, resolved, history of seizure, remote, and is status post C-section x2 and dilatation and curettage times one   PAST SURGICAL  HISTORY: Past Surgical History  Procedure Laterality Date  . Laparoscopic gastric banding  01/27/2009  . Cesarean section  1999, 2005  . Biopsy breast  08/07/09    Breast, Right Needle core Biopsy, UOQ - Pseudoangiomatous Stromal hyperplasia  . Breast biopsy  03/21/11  . Breast lumpectomy  04/19/11    snbx  . Breast lumpectomy  05/10/11    Left Breast Lumpectomy re-excision  . Dilation  and curettage of uterus      X 1  . Breast reduction surgery  02/15/2012    Procedure: MAMMARY REDUCTION  (BREAST);  Surgeon: Theodoro Kos, DO;  Location: [facility];  Service: Plastics;  Laterality: Right;  RIGHT BREAST MASTOPEXY    FAMILY HISTORY Family History  Problem Relation Age of Onset  . Stroke Mother     Stroke - mini  . Cancer Father     Lymphoma - passed age 75  The patient's father died at the age of 46 from sepsis in the setting of lymphoma. The patient's mother died at the age of 58 after a heart attack in the setting of multiple strokes. The patient had no brothers. She has 3 sisters. There is no history of breast or ovarian cancer in the family  GYNECOLOGIC HISTORY: Menarche age 23, first pregnancy to term age 62. She is GX P2. As noted above, she still having periods through tamoxifen, although they are slightly less frequent and briefer. 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 She divorced her first husband, with 51 and 1-year-old sons at home, with shared custody. She married [name] in 2014. He works as a Medical illustrator. She has 2 daughters from a prior marriage, age 40 and 65. She attends [employer] locally    ADVANCED DIRECTIVES: not in place  HEALTH MAINTENANCE: History  Substance Use Topics  . Smoking status: Never Smoker   . Smokeless tobacco: Never Used  . Alcohol Use: Yes     Comment: rarely     Colonoscopy:  PAP:  Bone density:  Lipid panel:  No Known Allergies  Current Outpatient Prescriptions  Medication Sig Dispense Refill  . Biotin 5000 MCG CAPS Take 1 capsule by mouth every morning.      . Calcium Carbonate-Vitamin D (CALCIUM PLUS VITAMIN D PO) Take by mouth daily.      . cetirizine (ZYRTEC) 10 MG tablet Take 10 mg by mouth as needed.       . citalopram (CELEXA) 10 MG tablet Take 10 mg by mouth daily.      . diclofenac  (VOLTAREN) 0.1 % ophthalmic solution Place 1 drop into both eyes 4 (four) times daily as needed.      . ferrous sulfate 325 (65 FE) MG tablet Take 325 mg by mouth 2 (two) times daily with a meal.      . ibuprofen (ADVIL,MOTRIN) 200 MG tablet Take 200 mg by mouth every 6 (six) hours as needed.      . lansoprazole (PREVACID) 30 MG capsule Take 30 mg by mouth daily at 12 noon.      . MULTIPLE VITAMIN PO Take 1 tablet by mouth.      . prednisoLONE sodium phosphate (INFLAMASE FORTE) 1 % ophthalmic solution Place 1 drop into the left eye 4 (four) times daily.      . tamoxifen (NOLVADEX) 20 MG tablet Take 1 tablet (20 mg total) by mouth daily.  [place]  tablet  0  . tobramycin (TOBREX) 0.3 % ophthalmic solution Place 1 drop into the left eye 4 (four) times daily.       Marland Kitchen acetaminophen (TYLENOL) 325 MG tablet Take 650 mg by mouth every 6 (six) hours as needed.      . phenylephrine (SUDAFED PE) 10 MG TABS Take 10 mg by mouth every 4 (four) hours as needed.       No current facility-administered medications for this visit.    OBJECTIVE: Middle-aged white woman who appears well Filed Vitals:   10/29/13 0850  BP: 151/84  Pulse: 57  Temp: 98.7 F (37.1 C)  Resp: 19     Body mass index is 39.95 kg/(m^2).    ECOG FS: 0  Skin: warm, dry  HEENT: sclerae anicteric, conjunctivae pink, oropharynx clear. No thrush or mucositis.  Lymph Nodes: No cervical or supraclavicular lymphadenopathy  Lungs: clear to auscultation bilaterally, no rales, wheezes, or rhonci  Heart: regular rate and rhythm  Abdomen: round, soft, non tender, positive bowel sounds  Musculoskeletal: No focal spinal tenderness, no peripheral edema  Neuro: non focal, well oriented, positive affect  Breasts: the right breast is status post reduction mammoplasty. No skin changes or irregular masses noted, good cosmetic result. Left breast status post lumpectomy and radiation with an irregular divot above the nipple. There is no evidence of local  recurrence. Left axilla benign.     LAB RESULTS: Labs from 08/16/2013 scanned into EPIC, all values WNL except HGB 9.8 HCT 31.3 (low).  Lab Results  Component Value Date   WBC 4.7 10/23/2012   NEUTROABS 3.1 10/23/2012   HGB 11.8 10/23/2012   HCT 35.6 10/23/2012   MCV 87.0 10/23/2012   PLT 252 10/23/2012      Chemistry      Component Value Date/Time   NA 142 10/23/2012 0808   NA 141 05/10/2011 1145   K 4.4 10/23/2012 0808   K 3.9 05/10/2011 1145   CL 105 05/01/2012 1044   CL 104 05/10/2011 1145   CO2 27 10/23/2012 0808   CO2 31 04/18/2011 0830   BUN 9.7 10/23/2012 0808   BUN 8 05/10/2011 1145   CREATININE 0.9 10/23/2012 0808   CREATININE 0.80 05/10/2011 1145      Component Value Date/Time   CALCIUM 9.2 10/23/2012 0808   CALCIUM 9.3 04/18/2011 0830   ALKPHOS 14* 10/23/2012 0808   ALKPHOS 17* 04/05/2011 1331   AST 64* 10/23/2012 0808   AST 20 04/05/2011 1331   ALT 33 10/23/2012 0808   ALT 17 04/05/2011 1331   BILITOT 0.32 10/23/2012 0808   BILITOT 0.5 04/05/2011 1331       Lab Results  Component Value Date   LABCA2 23 04/05/2011    No components found with this basename: [id number]    No results found for this basename: INR,  in the last 168 hours  Urinalysis No results found for this basename: colorurine,  appearanceur,  labspec,  phurine,  glucoseu,  hgbur,  bilirubinur,  ketonesur,  proteinur,  urobilinogen,  nitrite,  leukocytesur   STUDIES: Most recent mammogram on 04/23/2013 was unremarkable  ASSESSMENT: 48 y.o.  [place] woman status post Left lumpectomy and sentinel lymph node sampling 04/19/2011 for a T2 N0, stage IIA invasive ductal carcinoma, grade 1, estrogen and progesterone receptor positive at 57% and 97% respectively, HER-2 negative, with an MIB-1 of 10.  (1) Her Oncotype Dx predicts a distant recurrence rate of 13% if her only systemic treatment is tamoxifen  for 5 years.  (2) completed radiation 07/22/2011  (3) started tamoxifen May 2013   PLAN:   [name] [name] is doing  well as far as her breast cancer is concerned. She will continue the celexa as she believes it is helping her hot flashes. The labs were reviewed in detail with her and were stable. Her hgb was low, but she was put on oral iron supplements by her PCP and will return to that office next month for her yearly physical.   The plan is to continue the tamoxifen for a total of 10 years. She is now being seen yearly in this clinic and will return for an office visit in Sept with Dr. Jana Hakim. The labs again, will be drawn by [facility] before hand and faxed to [place]. Ronny understands and agrees with this plan. She has been encouraged to call with any issues that may arise before her next visit here.    Marcelino Duster    10/29/2013

## 2013-12-26 ENCOUNTER — Encounter (INDEPENDENT_AMBULATORY_CARE_PROVIDER_SITE_OTHER): Payer: BC Managed Care – PPO

## 2014-01-27 ENCOUNTER — Other Ambulatory Visit: Payer: Self-pay | Admitting: *Deleted

## 2014-01-27 DIAGNOSIS — C50412 Malignant neoplasm of upper-outer quadrant of left female breast: Secondary | ICD-10-CM

## 2014-01-27 DIAGNOSIS — C50419 Malignant neoplasm of upper-outer quadrant of unspecified female breast: Secondary | ICD-10-CM

## 2014-01-27 MED ORDER — TAMOXIFEN CITRATE 20 MG PO TABS: 20.0000 mg | ORAL_TABLET | Freq: Every day | ORAL | Status: DC

## 2014-03-28 ENCOUNTER — Other Ambulatory Visit: Payer: Self-pay | Admitting: Obstetrics and Gynecology

## 2014-03-28 DIAGNOSIS — Z853 Personal history of malignant neoplasm of breast: Secondary | ICD-10-CM

## 2014-05-05 ENCOUNTER — Other Ambulatory Visit: Payer: Self-pay

## 2014-05-05 DIAGNOSIS — C50412 Malignant neoplasm of upper-outer quadrant of left female breast: Secondary | ICD-10-CM

## 2014-05-05 DIAGNOSIS — C50419 Malignant neoplasm of upper-outer quadrant of unspecified female breast: Secondary | ICD-10-CM

## 2014-05-05 MED ORDER — TAMOXIFEN CITRATE 20 MG PO TABS: 20.0000 mg | ORAL_TABLET | Freq: Every day | ORAL | Status: DC

## 2014-05-08 ENCOUNTER — Other Ambulatory Visit: Payer: Self-pay | Admitting: *Deleted

## 2014-05-08 DIAGNOSIS — C50412 Malignant neoplasm of upper-outer quadrant of left female breast: Secondary | ICD-10-CM

## 2014-05-08 DIAGNOSIS — C50419 Malignant neoplasm of upper-outer quadrant of unspecified female breast: Secondary | ICD-10-CM

## 2014-05-08 MED ORDER — TAMOXIFEN CITRATE 20 MG PO TABS: 20.0000 mg | ORAL_TABLET | Freq: Every day | ORAL | Status: DC

## 2014-05-09 ENCOUNTER — Other Ambulatory Visit: Payer: Self-pay | Admitting: Emergency Medicine

## 2014-05-13 ENCOUNTER — Other Ambulatory Visit: Payer: Self-pay | Admitting: *Deleted

## 2014-05-20 ENCOUNTER — Ambulatory Visit
Admission: RE | Admit: 2014-05-20 | Discharge: 2014-05-20 | Disposition: A | Discharge status: Home / Self Care | Payer: BLUE CROSS/BLUE SHIELD | Source: Ambulatory Visit | Attending: Obstetrics and Gynecology | Admitting: Obstetrics and Gynecology

## 2014-05-20 DIAGNOSIS — Z853 Personal history of malignant neoplasm of breast: Secondary | ICD-10-CM

## 2014-11-25 ENCOUNTER — Ambulatory Visit (HOSPITAL_BASED_OUTPATIENT_CLINIC_OR_DEPARTMENT_OTHER): Payer: BLUE CROSS/BLUE SHIELD | Admitting: Oncology

## 2014-11-25 ENCOUNTER — Telehealth: Payer: Self-pay | Admitting: Oncology

## 2014-11-25 VITALS — BP 151/70 | HR 55 | Temp 98.9°F | Resp 18 | Ht 67.0 in | Wt 254.7 lb

## 2014-11-25 DIAGNOSIS — C50412 Malignant neoplasm of upper-outer quadrant of left female breast: Secondary | ICD-10-CM

## 2014-11-25 DIAGNOSIS — C50419 Malignant neoplasm of upper-outer quadrant of unspecified female breast: Secondary | ICD-10-CM

## 2014-11-25 MED ORDER — TAMOXIFEN CITRATE 20 MG PO TABS: 20.0000 mg | ORAL_TABLET | Freq: Every day | ORAL | Status: DC

## 2014-11-25 NOTE — Telephone Encounter (Signed)
Gave avs & calendar for May 2017. Will see Dr.Magrinat in May 2018 with lab week before.

## 2014-11-25 NOTE — Addendum Note (Signed)
Addended by: Laureen Abrahams on: 11/25/2014 04:43 PM   Modules accepted: Medications

## 2014-11-25 NOTE — Progress Notes (Signed)
ID: [name] [name]   DOB: 11/08/1965  MR#: [id number]  AYT#:[id number]  PCP: Tawanna Solo, MD GYN: SU: Alphonsa Overall OTHER MD: Theodoro Kos,   CHIEF COMPLAINT: left breast cancer   CURRENT TREATMENT: tamoxifen 64m daily  BREAST CANCER HISTORY: From the original intake note:  [name] a 49year old [place] who underwent a yearly screening mammography in 02/11/2011. This showed heterogeneously dense tissue and a possible mass in the left breast. Additional views 03/21/2011 showed an area of amorphous calcifications in the upper central aspect of the left breast, measuring up to 3.0 cm. There was no associated mass. Biopsy was performed the same day and showed (([id number] invasive ductal breast cancer, grade 1-2, with ductal carcinoma in situ, grade 1-3. The invasive tumor was 57% estrogen receptor positive, 97% progesterone receptor positive, with an MIB-1 of 10% and no HER-2 amplification.  With this information the patient proceeded to bilateral breast MRIs 03/31/2011 this showed a round enhancing mass with irregular margins measuring 6 mm, with an associated biopsy clip. There was additional clumped and linear enhancement of compatible with DCIS the total area measuring 3.1 cm. There were no other suspicious masses in either breast and there was no axillary or internal mammary adenopathy noted.   Her subsequent history is as detailed below.  INTERVAL HISTORY:  RCoralee [name] today for follow up of her breast cancer. She continues on tamoxifen, which she generally tolerates well. She does have some hot flashes which are helped by Celexa. She still having periods, although they are somewhat irregular, coming any 30-40 days. They're not unusually heavy.  REVIEW OF SYSTEMS: She has seasonal allergies and a little bit of a dry cough. She continues to have moderate heartburn which she attributes to her lab band history. She has some back pain and occasional joint pains  especially involving the left knee and she tells me Dr. MSabra Heckis referring her to an orthopedist but she does not yet know which. A detailed review of systems today was otherwise noncontributory  PAST MEDICAL HISTORY: Past Medical History  Diagnosis Date  . Status post biopsy 03/21/11    Breast, Left, Needle Core Biopsy, Upper Central - DCIS, Grade I-III with comedo Necrosis and Calcifications. ER+, PR+, Ki-67 10%, Her2- No Amplification  . Arthritis     Osteoarthritis Neck and Back  . Cancer     Left breast cancer  . Anemia     not at present  . S/P radiation therapy 06/13/2011 - 07/22/11    Left Breast/ 50 Gy in 25 Fractions: Left Breast Boost Boost/ 10 Gy in 5 Fractions  . DJD (degenerative joint disease)     Cervical and lumbar spin  . Hx of seizure disorder     Remote hx  . Seasonal allergies   She had a right breast biopsy remotely, which was benign. She has mild degenerative disc disease involving the cervical and lumbar spines. She status post lap band surgery November of 2010, and has lost 92 pounds so far. She has a history of seasonal allergies, hypertension which is improving eyes she continues to lose weight, history of postpartum migraines, resolved, history of seizure, remote, and is status post C-section x2 and dilatation and curettage times one   PAST SURGICAL HISTORY: Past Surgical History  Procedure Laterality Date  . Laparoscopic gastric banding  01/27/2009  . Cesarean section  1999, 2005  . Biopsy breast  08/07/09    Breast, Right Needle core Biopsy, UOQ - Pseudoangiomatous Stromal hyperplasia  .  Breast biopsy  03/21/11  . Breast lumpectomy  04/19/11    snbx  . Breast lumpectomy  05/10/11    Left Breast Lumpectomy re-excision  . Dilation and curettage of uterus      X 1  . Breast reduction surgery  02/15/2012    Procedure: MAMMARY REDUCTION  (BREAST);  Surgeon: Theodoro Kos, DO;  Location: [facility];  Service: Plastics;  Laterality: Right;  RIGHT  BREAST MASTOPEXY    FAMILY HISTORY Family History  Problem Relation Age of Onset  . Stroke Mother     Stroke - mini  . Cancer Father     Lymphoma - passed age 36  The patient's father died at the age of 6 from sepsis in the setting of lymphoma. The patient's mother died at the age of 18 after a heart attack in the setting of multiple strokes. The patient had no brothers. She has 3 sisters. There is no history of breast or ovarian cancer in the family  GYNECOLOGIC HISTORY: Menarche age 49, first pregnancy to term age 47. She is GX P2. As noted above, she still having periods through tamoxifen, although they are slightly less frequent and briefer. 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 She divorced her first husband, with 76 and 54-year-old sons at home, with shared custody. She married Aldea Avis in 2014. He works as a Medical illustrator. She has 2 daughters from a prior marriage, age 68 and 60. She attends [employer] locally    ADVANCED DIRECTIVES: not in place  HEALTH MAINTENANCE: Social History  Substance Use Topics  . Smoking status: Never Smoker   . Smokeless tobacco: Never Used  . Alcohol Use: Yes     Comment: rarely     Colonoscopy:  PAP:  Bone density:  Lipid panel:  No Known Allergies  Current Outpatient Prescriptions  Medication Sig Dispense Refill  . acetaminophen (TYLENOL) 325 MG tablet Take 650 mg by mouth every 6 (six) hours as needed.    . Biotin 5000 MCG CAPS Take 1 capsule by mouth every morning.    . Calcium Carbonate-Vitamin D (CALCIUM PLUS VITAMIN D PO) Take by mouth daily.    . cetirizine (ZYRTEC) 10 MG tablet Take 10 mg by mouth as needed.     . citalopram (CELEXA) 10 MG tablet Take 20 mg by mouth daily.    . ferrous sulfate 325 (65 FE) MG tablet Take 325 mg by mouth 2 (two) times daily with a meal.    . ibuprofen (ADVIL,MOTRIN) 200 MG tablet Take 200 mg by  mouth every 6 (six) hours as needed.    . lansoprazole (PREVACID) 30 MG capsule Take 30 mg by mouth daily at 12 noon.    . MULTIPLE VITAMIN PO Take 1 tablet by mouth.    . phenylephrine (SUDAFED PE) 10 MG TABS Take 10 mg by mouth every 4 (four) hours as needed.    . tamoxifen (NOLVADEX) 20 MG tablet Take 1 tablet (20 mg total) by mouth daily. 90 tablet 3   No current facility-administered medications for this visit.    OBJECTIVE: Middle-aged white woman who appears well Filed Vitals:   11/25/14 1023  BP: 151/70  Pulse: 55  Temp: 98.9 F (37.2 C)  Resp: 18     Body mass index is 39.88 kg/(m^2).    ECOG FS: 0 Filed Weights   11/25/14 1023  Weight: 254 lb  11.2 oz (115.531 kg)   Sclerae unicteric, pupils round and equal Oropharynx clear and moist-- no thrush or other lesions No cervical or supraclavicular adenopathy Lungs no rales or rhonchi Heart regular rate and rhythm Abd soft, obese, nontender, positive bowel sounds MSK no focal spinal tenderness, no upper extremity lymphedema Neuro: nonfocal, well oriented, appropriate affect Breasts: The right breast is status post reduction mammoplasty. There are no suspicious findings. The left breast is status post lumpectomy and radiation. There is some distortion of the breast contour associated with the nipple, but no evidence of disease recurrence. The left axilla is benign.  Marland Kitchen     LAB RESULTS: Labs from 08/16/2013 scanned into EPIC, all values WNL except HGB 9.8 HCT 31.3 (low).  Lab Results  Component Value Date   WBC 4.7 10/23/2012   NEUTROABS 3.1 10/23/2012   HGB 11.8 10/23/2012   HCT 35.6 10/23/2012   MCV 87.0 10/23/2012   PLT 252 10/23/2012      Chemistry      Component Value Date/Time   NA 142 10/23/2012 0808   NA 141 05/10/2011 1145   K 4.4 10/23/2012 0808   K 3.9 05/10/2011 1145   CL 105 05/01/2012 1044   CL 104 05/10/2011 1145   CO2 27 10/23/2012 0808   CO2 31 04/18/2011 0830   BUN 9.7 10/23/2012 0808   BUN  8 05/10/2011 1145   CREATININE 0.9 10/23/2012 0808   CREATININE 0.80 05/10/2011 1145      Component Value Date/Time   CALCIUM 9.2 10/23/2012 0808   CALCIUM 9.3 04/18/2011 0830   ALKPHOS 14* 10/23/2012 0808   ALKPHOS 17* 04/05/2011 1331   AST 64* 10/23/2012 0808   AST 20 04/05/2011 1331   ALT 33 10/23/2012 0808   ALT 17 04/05/2011 1331   BILITOT 0.32 10/23/2012 0808   BILITOT 0.5 04/05/2011 1331       Lab Results  Component Value Date   LABCA2 23 04/05/2011    No components found for: [id number]  No results for input(s): INR in the last 168 hours.  Urinalysis No results found for: COLORURINE    STUDIES:   CLINICAL DATA: Malignant lumpectomy of the upper outer quadrant of the left breast in February, 2013, pathology low-grade invasive ductal carcinoma, DCIS, LCIS and atypical lobular hyperplasia. Patient currently undergoing hormonal therapy with tamoxifen. Subsequent right breast reduction. Annual evaluation.  EXAM: DIGITAL DIAGNOSTIC BILATERAL MAMMOGRAM WITH 3D TOMOSYNTHESIS AND CAD  COMPARISON: 04/23/2013 and earlier.  ACR Breast Density Category b: There are scattered areas of fibroglandular density.  FINDINGS: CC and MLO views of both breasts with tomosynthesis and a spot tangential magnification view of the lumpectomy site in the left breast were obtained. Post lumpectomy scarring in the upper outer left breast, middle and posterior depth. Scarring in the left axilla at the site of node dissection. Scarring in the upper outer quadrant of the right breast related to the prior reduction surgery.  No findings suspicious for malignancy in either breast.  Mammographic images were processed with CAD.  IMPRESSION: No specific mammographic evidence of malignancy. Expected post lumpectomy changes in the left breast.  RECOMMENDATION: Bilateral diagnostic mammography in 1 year.  I have discussed the findings and recommendations with the  patient. Results were also provided in writing at the conclusion of the visit. If applicable, a reminder letter will be sent to the patient regarding the next appointment.  BI-RADS CATEGORY 2: Benign.   Electronically Signed  By: Evangeline Dakin M.D.  On: 05/20/2014  08:50   ASSESSMENT: 49 y.o.  [place] Manns woman status post Left lumpectomy and sentinel lymph node sampling 04/19/2011 for a T2 N0, stage IIA invasive ductal carcinoma, grade 1, estrogen and progesterone receptor positive at 57% and 97% respectively, HER-2 negative, with an MIB-1 of 10.  (1) Her Oncotype Dx predicts a distant recurrence rate of 13% if her only systemic treatment is tamoxifen for 5 years.  (2) completed radiation 07/22/2011  (3) started tamoxifen May 2013--plan is for 10 years of antiestrogen therapy  (4) genetics testing offered to patient and rediscussed September 2016: Patient opts against it   PLAN:   [name] [name] is now 3-1/2 years out from her definitive surgery with no evidence of disease recurrence. This is very favorable.  We again discussed genetics testing. She initially refused it sometime ago. Today we went over the fact that if she were found to carry a BRCA mutation first of all she would want to undergo bilateral salpingo-oophorectomy which is the only way to reduce the risk of ovarian cancer in that situation (there is no proven way to screen for that cancer). She would also want to intensify her breast screening with yearly MRI in addition to yearly mammography. She has a good understanding of this but nevertheless at this point declines testing  I discussed our new survivorship program and she will see our breast survivorship  Practitioner in May 2017. She will see me May the following year and we will continue to "tag team her" in that fashion until she completes 10 years on tamoxifen. That incidentally we will lower her risk of outside the breast recurrence to 10% instead of 13%, as  predicted by Oncotype  She will see Dr. Sabra Heck every September and she will see Dr. Lucia Gaskins every January for the next 2 years after which further surgical follow-up will be optional  She has a good understanding of this plan. She knows to call for any problems that may develop before her next visit here.  MAGRINAT,GUSTAV C    11/25/2014

## 2015-01-29 ENCOUNTER — Ambulatory Visit
Admission: RE | Admit: 2015-01-29 | Discharge: 2015-01-29 | Disposition: A | Discharge status: Home / Self Care | Payer: BLUE CROSS/BLUE SHIELD | Source: Ambulatory Visit | Attending: Physician Assistant | Admitting: Physician Assistant

## 2015-01-29 ENCOUNTER — Other Ambulatory Visit: Payer: Self-pay | Admitting: Physician Assistant

## 2015-01-29 DIAGNOSIS — R4702 Dysphasia: Secondary | ICD-10-CM

## 2015-04-17 ENCOUNTER — Other Ambulatory Visit: Payer: Self-pay | Admitting: Obstetrics and Gynecology

## 2015-04-17 DIAGNOSIS — Z9889 Other specified postprocedural states: Secondary | ICD-10-CM

## 2015-04-17 DIAGNOSIS — Z853 Personal history of malignant neoplasm of breast: Secondary | ICD-10-CM

## 2015-06-01 ENCOUNTER — Ambulatory Visit
Admission: RE | Admit: 2015-06-01 | Discharge: 2015-06-01 | Disposition: A | Discharge status: Home / Self Care | Payer: 59 | Source: Ambulatory Visit | Attending: Obstetrics and Gynecology | Admitting: Obstetrics and Gynecology

## 2015-06-01 DIAGNOSIS — Z853 Personal history of malignant neoplasm of breast: Secondary | ICD-10-CM

## 2015-06-01 DIAGNOSIS — Z9889 Other specified postprocedural states: Secondary | ICD-10-CM

## 2015-07-02 ENCOUNTER — Encounter: Payer: Self-pay | Admitting: Oncology

## 2015-07-07 ENCOUNTER — Other Ambulatory Visit: Payer: Self-pay | Admitting: *Deleted

## 2015-07-07 DIAGNOSIS — C50412 Malignant neoplasm of upper-outer quadrant of left female breast: Secondary | ICD-10-CM

## 2015-07-07 DIAGNOSIS — C50419 Malignant neoplasm of upper-outer quadrant of unspecified female breast: Secondary | ICD-10-CM

## 2015-07-07 MED ORDER — TAMOXIFEN CITRATE 20 MG PO TABS: 20.0000 mg | ORAL_TABLET | Freq: Every day | ORAL | Status: DC

## 2015-07-14 ENCOUNTER — Other Ambulatory Visit (HOSPITAL_BASED_OUTPATIENT_CLINIC_OR_DEPARTMENT_OTHER): Payer: 59

## 2015-07-14 DIAGNOSIS — C50412 Malignant neoplasm of upper-outer quadrant of left female breast: Secondary | ICD-10-CM

## 2015-07-14 LAB — CBC WITH DIFFERENTIAL/PLATELET
BASO%: 0.5 % (ref 0.0–2.0)
Basophils Absolute: 0 10*3/uL (ref 0.0–0.1)
EOS ABS: 0.1 10*3/uL (ref 0.0–0.5)
EOS%: 2 % (ref 0.0–7.0)
HCT: 38.6 % (ref 34.8–46.6)
HGB: 12.7 g/dL (ref 11.6–15.9)
LYMPH%: 29.8 % (ref 14.0–49.7)
MCH: 29.2 pg (ref 25.1–34.0)
MCHC: 32.9 g/dL (ref 31.5–36.0)
MCV: 88.7 fL (ref 79.5–101.0)
MONO#: 0.4 10*3/uL (ref 0.1–0.9)
MONO%: 6.2 % (ref 0.0–14.0)
NEUT#: 3.8 10*3/uL (ref 1.5–6.5)
NEUT%: 61.5 % (ref 38.4–76.8)
Platelets: 234 10*3/uL (ref 145–400)
RBC: 4.35 10*6/uL (ref 3.70–5.45)
RDW: 13 % (ref 11.2–14.5)
WBC: 6.1 10*3/uL (ref 3.9–10.3)
lymph#: 1.8 10*3/uL (ref 0.9–3.3)

## 2015-07-14 LAB — COMPREHENSIVE METABOLIC PANEL
ALT: 15 U/L (ref 0–55)
AST: 15 U/L (ref 5–34)
Albumin: 3.7 g/dL (ref 3.5–5.0)
Alkaline Phosphatase: 16 U/L — ABNORMAL LOW (ref 40–150)
Anion Gap: 9 mEq/L (ref 3–11)
BUN: 10.5 mg/dL (ref 7.0–26.0)
CO2: 29 meq/L (ref 22–29)
Calcium: 9.4 mg/dL (ref 8.4–10.4)
Chloride: 104 mEq/L (ref 98–109)
Creatinine: 0.9 mg/dL (ref 0.6–1.1)
EGFR: 75 mL/min/{1.73_m2} — AB (ref >90)
Glucose: 90 mg/dl (ref 70–140)
Potassium: 4.3 mEq/L (ref 3.5–5.1)
Sodium: 142 mEq/L (ref 136–145)
Total Bilirubin: 0.4 mg/dL (ref 0.20–1.20)
Total Protein: 7 g/dL (ref 6.4–8.3)

## 2015-07-21 ENCOUNTER — Encounter: Payer: Self-pay | Admitting: Nurse Practitioner

## 2015-07-21 ENCOUNTER — Telehealth: Payer: Self-pay | Admitting: Oncology

## 2015-07-21 ENCOUNTER — Ambulatory Visit (HOSPITAL_BASED_OUTPATIENT_CLINIC_OR_DEPARTMENT_OTHER): Payer: 59 | Admitting: Nurse Practitioner

## 2015-07-21 VITALS — BP 152/86 | HR 62 | Temp 98.9°F | Resp 19 | Wt 261.5 lb

## 2015-07-21 DIAGNOSIS — N951 Menopausal and female climacteric states: Secondary | ICD-10-CM

## 2015-07-21 DIAGNOSIS — C50412 Malignant neoplasm of upper-outer quadrant of left female breast: Secondary | ICD-10-CM

## 2015-07-21 NOTE — Patient Instructions (Signed)
Thank you for coming in today!  As we discussed, please continue to perform your self breast exam and report any changes. If you note any new symptoms (please see below), be sure to notify us ASAP.  Your mammogram will be due in March 2018. Please keep your follow up appointment with Dr. Lucia Gaskins in October 2017 and we'll have you return in April 2018 to see Dr. Jana Hakim.  Please be sure to stop by scheduling on your way out to make those appointment.  Looking forward to working with you in the future!  Let us know if you have any questions!  Symptoms to Watch for and Report to Your Provider  . Return of the cancer symptoms you had before- such as a lump or new growth where your cancer first started . New or unusual pain that seems unrelated to an injury and does not go away, including back pain or bone pain . Weight loss without trying/intending . Unexplained bleeding . A rash or allergic reaction, such as swelling, severe itching or wheezing . Chills or fevers . Persistent headaches . Shortness of breath or difficulty breathing . Bloody stools or blood in your urine . Lumps, bumps, swelling and/or nipple discharge . Nausea, vomiting, diarrhea, loss of appetite, or trouble swallowing . A cough that doesn't go away . Abdominal pain . Swelling in your arms or legs . Fractures . Hot flashes or other menopausal symptoms . Any other signs mentioned by your doctor or nurse or any unusual symptoms                 that you just can't explain   NOTE: Just because you have certain symptoms, it doesn't mean the cancer has come back or you have a new cancer. Symptoms can be due to other problems that need to be addressed.  It is important to watch for these symptoms and report them to your provider so you can be medically evaluated for any of these concerns!     Living a Life of Wellness After Cancer:  *Note: Please consult your health care provider before using any medications, supplements,  over-the-counter products, or other interventions.  Also, please consult your primary care provider before you begin any lifestyle program (diet, exercise, etc.).  Your safety is our top priority and we want to make sure you continue to live a long and healthy life!    Healthy Lifestyle Recommendations  As a cancer survivor, it is important develop a lifelong commitment to a healthy lifestyle. A healthy lifestyle can prevent cancer from returning as well as prevent other diseases like heart disease, diabetes and high blood pressure.  These are some things that you can do to have a healthy lifestyle:  Marland Kitchen Maintain a healthy weight.  . Exercise daily per your doctor's orders. . Eat a balanced diet high in fruits, vegetables, bran, and fiber. Limit intake of red meat      and processed foods.  . Limit how much alcohol you consume, if at all. Ali Lowe regular bone mineral density testing for osteoporosis.  . Talk to your doctor about cardiovascular disease or "heart disease" screening. . Stop smoking (if you smoke). . Know your family history. . Be mindful of your emotional, social, and spiritual needs. . Meet regularly with a Primary Care Provider (PCP). Find a PCP if you do not             already have one. . Talk to your doctor about regular cancer  screening including screening for colon           cancer, GYN cancers, and skin cancer.

## 2015-07-21 NOTE — Telephone Encounter (Signed)
Gave and printed appt sched and avs for pt for April 2018 °

## 2015-07-21 NOTE — Progress Notes (Signed)
CLINIC:  Cancer Survivorship   REASON FOR VISIT:  Routine follow-up post-treatment for history of breast cancer.  BRIEF ONCOLOGIC HISTORY:    Breast cancer of upper-outer quadrant of left female breast ([place])   03/21/2011 Initial Biopsy Left breast core needle bx: IDC, grade 1-2; DCIS, grade 1-3 with associated necrosis and calcifications   03/31/2011 Breast MRI A round, enhancing mass with irregular margins is imaged in the 12 o'clock position of the left breast, anteriorly measuring 0.4 x 0.5 x 0.6 cm with associated biopsy clip.   03/31/2011 Clinical Stage Stage IIA: T2 N0   04/19/2011 Definitive Surgery Left lumpectomy/SLNB: invasive ductal carcinoma, grade 1, ER+ (57%), PR+ (97%), Her2/neu negative (ratio 1.28), Ki67 10%; 0/4 LN   04/19/2011 Pathologic Stage Stage IIA: T2 N0   04/19/2011 Oncotype testing RS 20 (13% ROR)   06/13/2011 - 07/22/2011 Radiation Therapy Adjuvant RT: left breast: Left Breast / 50 Gy in 25 fractions. Left Breast boost / 10 Gy in 5 fractions.   07/2011 -  Anti-estrogen oral therapy Tamoxifen 20 mg daily. Planned duration of therapy 10 years    Procedure Genetic testing - pt declined    INTERVAL HISTORY:  [name] [name] presents to the [facility] today for ongoing follow up regarding her history of breast cancer. Overall, Ms. Sonneborn reports feeling doing well since her last visit with Dr. Jana Hakim in September. She continues to tolerate the tamoxifen with minimal hot flashes and denies continued night sweats with her current dose of citalopram.  She continues with her menstrual cycles but they are becoming more irregular with longer days between cycles.  Bleeding is still minimal.  She has had no shortness of breath, calf pain or swelling. She has not noticed any change within her breast and her last mammogram was in March 2017 and unremarkable. She continues with tenderness along her left breast and occasional stinging / burning along her incision at the area of her  scar. This is unchanged.  She is scheduled to see Dr. Lucia Gaskins in six months after seeing him last month.  He felt her to be doing well.  She denies any headache or cough.  She has some left shoulder, left knee and back pain that predates her cancer diagnosis and these are stable.  She continues in her efforts to lose weight and reports a good appetite.  She is attempting to exercise regularly.     REVIEW OF SYSTEMS:  General: Hot flashes as above. Denies fever, chills, unintentional weight loss, or generalized fatigue.  HEENT: Denies visual changes, hearing loss, mouth sores, or difficulty swallowing. Cardiac: Occasional twinge of discomfort across her chest with prolonged exercise.  Relieved with rest.  Denies accompanying chest pressure, nausea, radiation to jaw or shoulder.  Otherwise, denies palpitations and lower extremity edema.  Respiratory: Denies wheeze or dyspnea on exertion.  Breast: As above. GI: Occasional heartburn, otherwise, denies abdominal pain, constipation, diarrhea, nausea, or vomiting.  GU: Denies dysuria, hematuria, vaginal bleeding, vaginal discharge, or vaginal dryness.  Musculoskeletal: As above. Neuro: Denies recent fall or numbness / tingling in her extremities.  Skin: Denies rash, pruritis, or open wounds.  Psych: Denies depression, anxiety, insomnia, or memory loss.   A 14-point review of systems was completed and was negative, except as noted above.   ONCOLOGY TREATMENT TEAM:  1. Surgeon:  Dr. Lucia Gaskins at [facility]  2. Medical Oncologist: Dr. Jana Hakim 3. Radiation Oncologist: Dr. Isidore Moos    PAST MEDICAL/SURGICAL HISTORY:  Past Medical History  Diagnosis Date  . Status post biopsy 03/21/11    Breast, Left, Needle Core Biopsy, [place] - DCIS, Grade I-III with comedo Necrosis and Calcifications. ER+, PR+, Ki-67 10%, Her2- No Amplification  . Arthritis     Osteoarthritis Neck and Back  . Cancer [facility])     Left breast cancer  . Anemia     not  at present  . S/P radiation therapy 06/13/2011 - 07/22/11    Left Breast/ 50 Gy in 25 Fractions: Left Breast Boost Boost/ 10 Gy in 5 Fractions  . DJD (degenerative joint disease)     Cervical and lumbar spin  . Hx of seizure disorder     Remote hx  . Seasonal allergies    Past Surgical History  Procedure Laterality Date  . Laparoscopic gastric banding  01/27/2009  . Cesarean section  1999, 2005  . Biopsy breast  08/07/09    Breast, Right Needle core Biopsy, UOQ - Pseudoangiomatous Stromal hyperplasia  . Breast biopsy  03/21/11  . Breast lumpectomy  04/19/11    snbx  . Breast lumpectomy  05/10/11    Left Breast Lumpectomy re-excision  . Dilation and curettage of uterus      X 1  . Breast reduction surgery  02/15/2012    Procedure: MAMMARY REDUCTION  (BREAST);  Surgeon: Theodoro Kos, DO;  Location: [facility];  Service: Plastics;  Laterality: Right;  RIGHT BREAST MASTOPEXY     ALLERGIES:  Allergies  Allergen Reactions  . Prednisolone Hives     CURRENT MEDICATIONS:  Current Outpatient Prescriptions on File Prior to Visit  Medication Sig Dispense Refill  . acetaminophen (TYLENOL) 325 MG tablet Take 650 mg by mouth every 6 (six) hours as needed.    . Biotin 5000 MCG CAPS Take 1 capsule by mouth every morning.    . Calcium Carbonate-Vitamin D (CALCIUM PLUS VITAMIN D PO) Take by mouth daily.    . cetirizine (ZYRTEC) 10 MG tablet Take 10 mg by mouth as needed.     . citalopram (CELEXA) 10 MG tablet Take 20 mg by mouth daily.    . ferrous sulfate 325 (65 FE) MG tablet Take 325 mg by mouth 2 (two) times daily with a meal.    . lansoprazole (PREVACID) 30 MG capsule Take 30 mg by mouth daily at 12 noon.    . MULTIPLE VITAMIN PO Take 1 tablet by mouth.    . phenylephrine (SUDAFED PE) 10 MG TABS Take 10 mg by mouth every 4 (four) hours as needed.    . tamoxifen (NOLVADEX) 20 MG tablet Take 1 tablet (20 mg total) by mouth daily. 90 tablet 3  . ibuprofen (ADVIL,MOTRIN) 200 MG  tablet Take 200 mg by mouth every 6 (six) hours as needed. Reported on 07/21/2015     No current facility-administered medications on file prior to visit.     ONCOLOGIC FAMILY HISTORY:  Family History  Problem Relation Age of Onset  . Stroke Mother     Stroke - mini  . Cancer Father     Lymphoma - passed age 54     GENETIC COUNSELING/TESTING: No (pt declined)  SOCIAL HISTORY:  Sayward Horvath is married and lives with her family in [place], [place].  She has 4 children. 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  She denies any current or history of tobacco or illicit drug use, and uses alcohol rarely.  HEALTH MAINTENANCE: Colonoscopy: 2016 (due 2026)  PAP: UTD   PHYSICAL EXAMINATION:  Vital Signs: Filed Vitals:   07/21/15 0827  BP: 152/86  Pulse: 62  Temp: 98.9 F (37.2 C)  Resp: 19   Weight 261.5 (increased 7.5 # since 11/2014) ECOG performance status: 0 General: Well-nourished, well-appearing female in no acute distress.  She is unaccompanied in clinic today.   HEENT: Head is atraumatic and normocephalic.  Pupils equal and reactive to light and accomodation. Conjunctivae clear without exudate.  Sclerae anicteric. Oral mucosa is pink, moist, and intact without lesions.  Oropharynx is pink without lesions or erythema.  Lymph: No cervical, supraclavicular, infraclavicular, or axillary lymphadenopathy noted on palpation.  Cardiovascular: Regular rate and rhythm without murmurs, rubs, or gallops. Respiratory: Clear to auscultation bilaterally. Chest expansion symmetric without accessory muscle use on inspiration or expiration.  Breast: Bilateral breast exam performed.  Lumpectomy scar along central left breast, tender to palpation but without nodularity or mass.  No mass or nodule throughout either left or right breast. GI: Abdomen soft and round. No tenderness to palpation. Bowel sounds normoactive in 4 quadrants. No  hepatosplenomegaly.   GU: Deferred.  Musculoskeletal: Muscle strength 5/5 in all extremities.   Neuro: No focal deficits. Steady gait.  Psych: Mood and affect normal and appropriate for situation.  Extremities: No edema, cyanosis, or clubbing.  Skin: Warm and dry. No open lesions noted.   LABORATORY DATA:  Recent Results (from the past 2160 hour(s))  CBC with Differential     Status: None   Collection Time: 07/14/15  7:50 AM  Result Value Ref Range   WBC 6.1 3.9 - 10.3 10e3/uL   NEUT# 3.8 1.5 - 6.5 10e3/uL   HGB 12.7 11.6 - 15.9 g/dL   HCT 38.6 34.8 - 46.6 %   Platelets 234 145 - 400 10e3/uL   MCV 88.7 79.5 - 101.0 fL   MCH 29.2 25.1 - 34.0 pg   MCHC 32.9 31.5 - 36.0 g/dL   RBC 4.35 3.70 - 5.45 10e6/uL   RDW 13.0 11.2 - 14.5 %   lymph# 1.8 0.9 - 3.3 10e3/uL   MONO# 0.4 0.1 - 0.9 10e3/uL   Eosinophils Absolute 0.1 0.0 - 0.5 10e3/uL   Basophils Absolute 0.0 0.0 - 0.1 10e3/uL   NEUT% 61.5 38.4 - 76.8 %   LYMPH% 29.8 14.0 - 49.7 %   MONO% 6.2 0.0 - 14.0 %   EOS% 2.0 0.0 - 7.0 %   BASO% 0.5 0.0 - 2.0 %  Comprehensive metabolic panel     Status: Abnormal   Collection Time: 07/14/15  7:50 AM  Result Value Ref Range   Sodium 142 136 - 145 mEq/L   Potassium 4.3 3.5 - 5.1 mEq/L   Chloride 104 98 - 109 mEq/L   CO2 29 22 - 29 mEq/L   Glucose 90 70 - 140 mg/dl    Comment: Glucose reference range is for nonfasting patients. Fasting glucose reference range is 70- 100.   BUN 10.5 7.0 - 26.0 mg/dL   Creatinine 0.9 0.6 - 1.1 mg/dL   Total Bilirubin 0.40 0.20 - 1.20 mg/dL   Alkaline Phosphatase 16 (L) 40 - 150 U/L   AST 15 5 - 34 U/L   ALT 15 0 - 55 U/L   Total Protein 7.0 6.4 - 8.3 g/dL   Albumin 3.7 3.5 - 5.0 g/dL   Calcium 9.4 8.4 - 10.4 mg/dL   Anion Gap 9 3 - 11 mEq/L   EGFR 75 (L) >90 ml/min/1.73 m2  Comment: eGFR is calculated using the CKD-EPI Creatinine Equation (2009)    DIAGNOSTIC IMAGING: Bilateral diagnostic mammogram with tomosynthesis performed 06/01/2015 reveals  stable postlumpectomy changes in the left breast without concerning mass, calcifications, or non surgical architectural distortion noted.  Breast density category B.     ASSESSMENT AND PLAN:   1. Breast cancer:  Stage IIA invasive ductal carcinoma of the left breast (03/2011), ER positive, PR positive, HER2/neu negative, S/P left lumpectomy/SLNB (04/2011) with oncotype RS 20, no chemotherapy, declined genetic testing, S/P adjuvant radiation therapy to left breast (completed 07/2011) followed by adjuvant endocrine therapy with tamoxifen begun 07/2011.  Ms. Sarrazin is doing well with no clinical symptoms worrisome for cancer recurrence at this time. I have reviewed the recommendations for ongoing surveillance with her and she will follow-up with her medical oncologist,  Dr. Jana Hakim, in April 2018 after seeing Dr. Lucia Gaskins in October 2017 for history and physical exam per surveillance protocol.  She will be due mammography in March 2018.  She will continue her anti-estrogen therapy with tamoxifen at this time.  She was instructed to make [place] aware if she notes any change within her breast, any new symptoms such as pain, shortness of breath, weight loss, or fatigue.  She will also report any new or increased side effects of the endocrine therapy or any difficulties with it.   Though the incidence is low, there is an associated risk of endometrial cancer with anti-estrogen therapies like Tamoxifen.  Ms. Mcbane was encouraged to contact us with any vaginal bleeding, chest pain, calf pain or swelling while taking Tamoxifen. Other side effects of Tamoxifen were again reviewed with her as well.  I have discussed with her the purpose and intent of the Survivorship clinic and have welcomed her to return to see [place] at any point in the future for her surveillance or contact me with questions.  2. Hot flashes: Ms. Castillo symptoms are currently managed well on her current dosing of citalopram. We will continue it at this  time.  3. Cancer screening:  Due to Ms. Placke's history and her age, she should receive screening for skin cancers, colon cancer, and gynecologic cancers.  The information and recommendations were shared with the patient and in her written after visit summary. She has had previous discussion with Dr. Jana Hakim concerning elective oophorectomy and plans to discuss this further with her gynecologist at her next visit.  4. Health maintenance and wellness promotion:Ms. Faulk and I discussed recommendations to maximize nutrition and minimize recurrence, such as increased intake of fruits, vegetables, lean proteins, and minimizing the intake of red meats and processed foods.  She was also encouraged to engage in moderate to vigorous exercise for 30 minutes per day most days of the week. She will closely monitor for any additional episodes of chest pain / discomfort (beyond twinges) and seek emergency care if they develop.  She continues her efforts to lose weight and exercise and I have encouraged her to continue them.  She was instructed to limit her alcohol consumption and continue to abstain from tobacco use.   5.  Support services/counseling: Ms. Reininger was offered support today through active listening and expressive supportive counseling.  She has information regarding our available services and encouraged to contact me with any questions or for help enrolling in any of our support group/programs.   A total of 30 minutes of face-to-face time was spent with this patient with greater than 50% of that time in counseling and care-coordination.  Sylvan Cheese, NP  Survivorship Program [facility] [phone]   Note: PRIMARY CARE PROVIDER Tawanna Solo, [place] [phone]

## 2015-11-18 ENCOUNTER — Other Ambulatory Visit (HOSPITAL_COMMUNITY): Payer: Self-pay | Admitting: Surgery

## 2015-11-26 ENCOUNTER — Ambulatory Visit (HOSPITAL_COMMUNITY)
Admission: RE | Admit: 2015-11-26 | Discharge: 2015-11-26 | Disposition: A | Discharge status: Home / Self Care | Payer: 59 | Source: Ambulatory Visit | Attending: Surgery | Admitting: Surgery

## 2015-11-26 ENCOUNTER — Other Ambulatory Visit (HOSPITAL_COMMUNITY): Payer: Self-pay | Admitting: Surgery

## 2015-11-26 ENCOUNTER — Other Ambulatory Visit: Payer: Self-pay

## 2015-11-26 DIAGNOSIS — Z9884 Bariatric surgery status: Secondary | ICD-10-CM | POA: Diagnosis not present

## 2015-11-26 DIAGNOSIS — Z01818 Encounter for other preprocedural examination: Secondary | ICD-10-CM | POA: Diagnosis not present

## 2015-11-26 DIAGNOSIS — R932 Abnormal findings on diagnostic imaging of liver and biliary tract: Secondary | ICD-10-CM | POA: Insufficient documentation

## 2015-11-26 DIAGNOSIS — R935 Abnormal findings on diagnostic imaging of other abdominal regions, including retroperitoneum: Secondary | ICD-10-CM | POA: Insufficient documentation

## 2015-11-26 IMAGING — US US ABDOMEN COMPLETE
1 series · 13 of 25 positions shown · non-contrast
Comparison: Abdominal ultrasound dated [DATE]

CLINICAL DATA: Morbid obesity, pre surgical workup

EXAM:
ABDOMEN ULTRASOUND COMPLETE

[Series 1: us abdomen complete · 0.25mm/px · 13 of 115 slices shown]
[im 1/115]
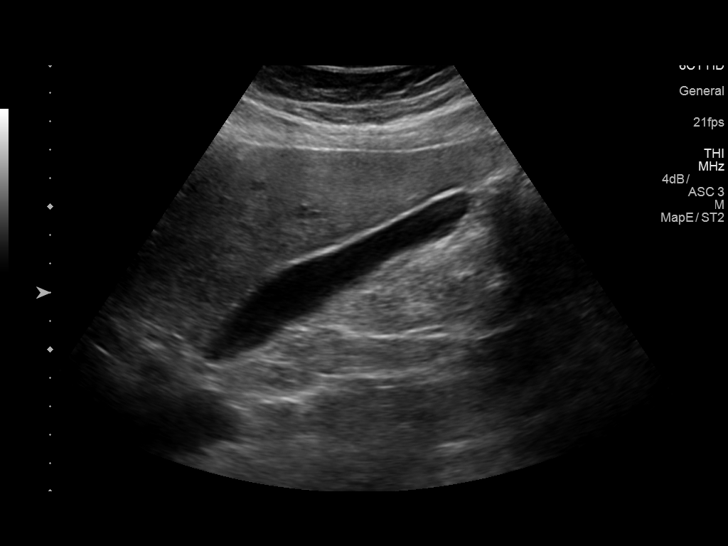
[im 10/115]
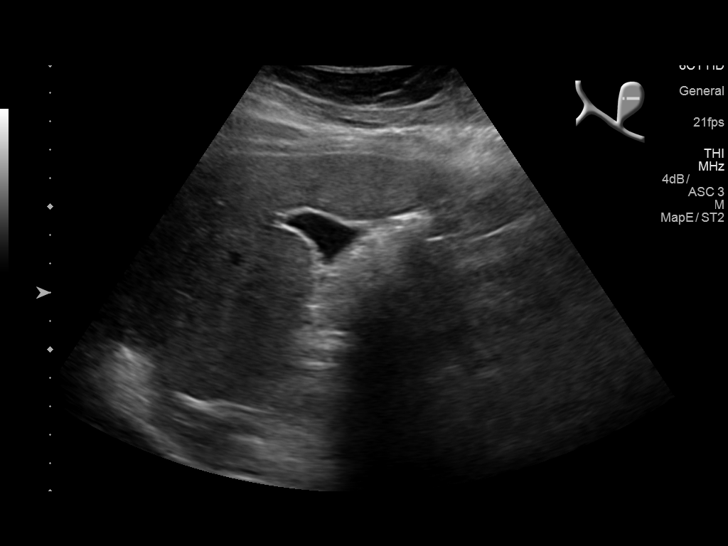
[im 20/115]
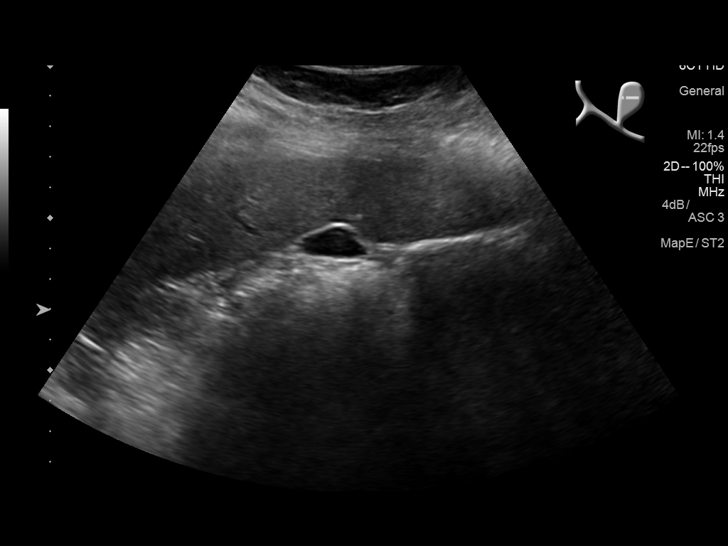
[im 29/115]
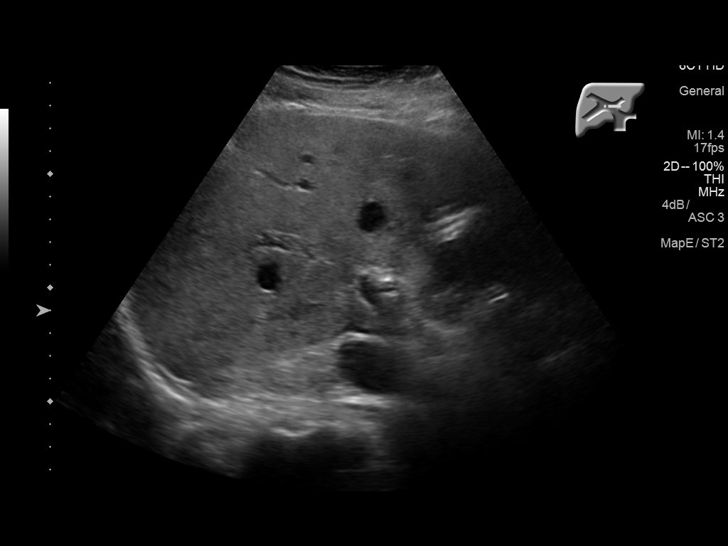
[im 39/115]
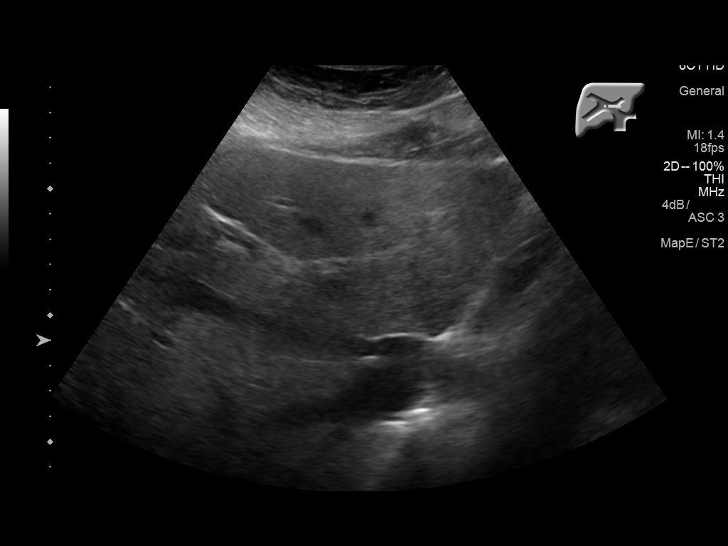
[im 48/115]
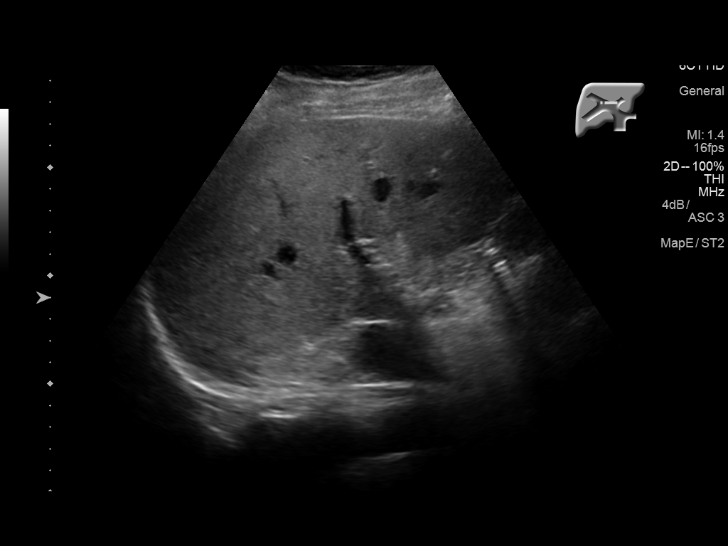
[im 58/115]
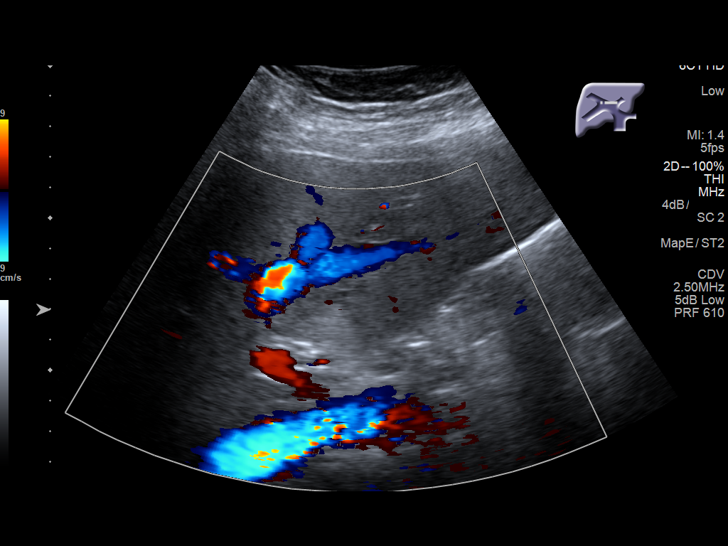
[im 67/115]
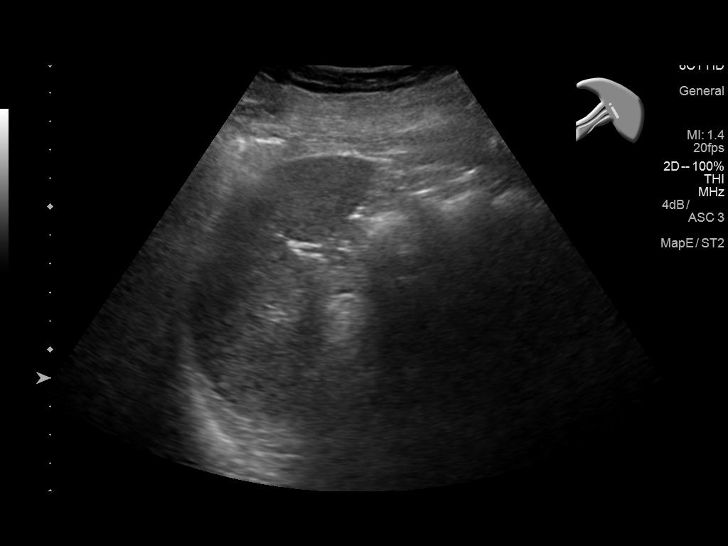
[im 77/115]
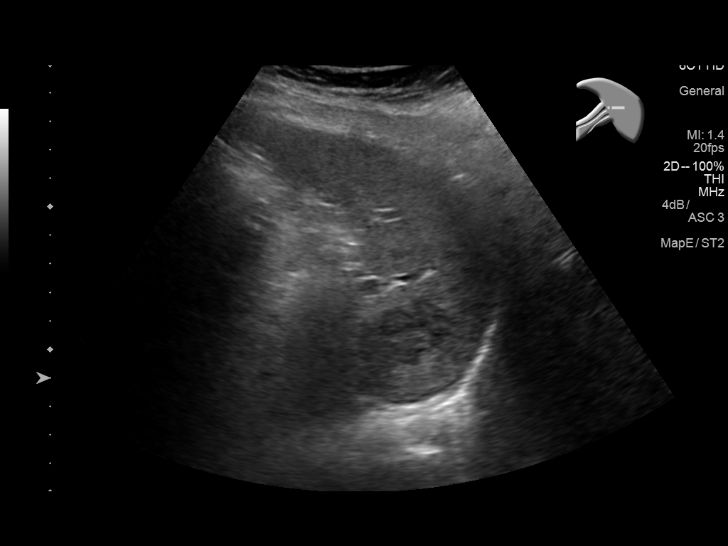
[im 86/115]
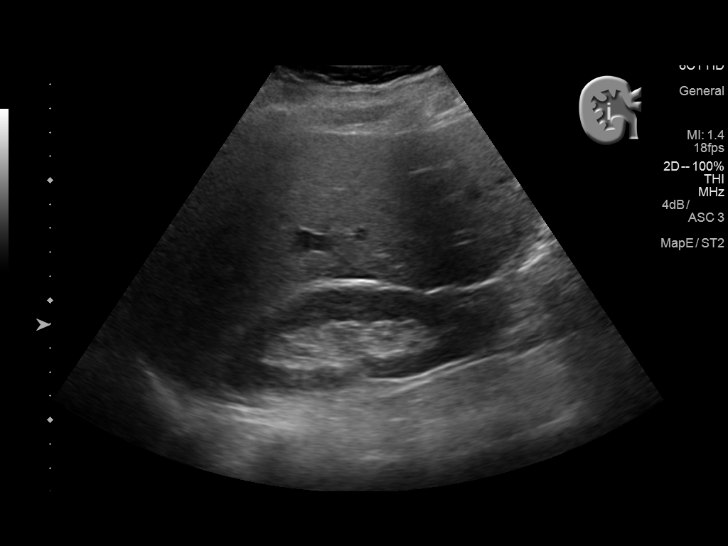
[im 96/115]
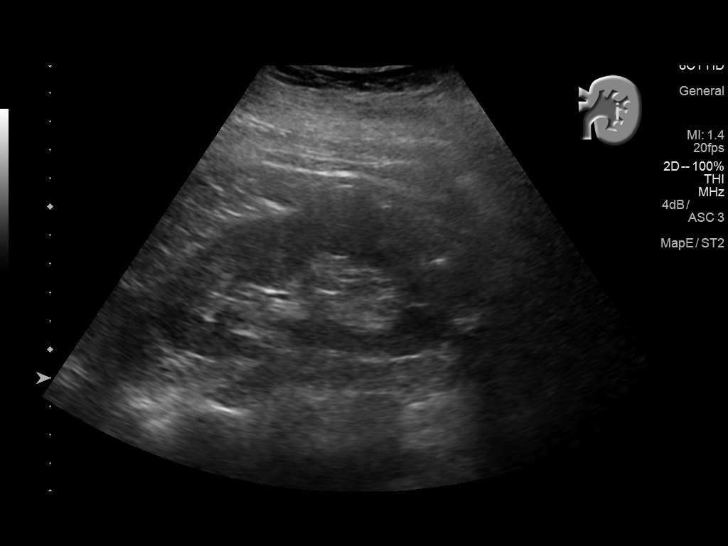
[im 105/115]
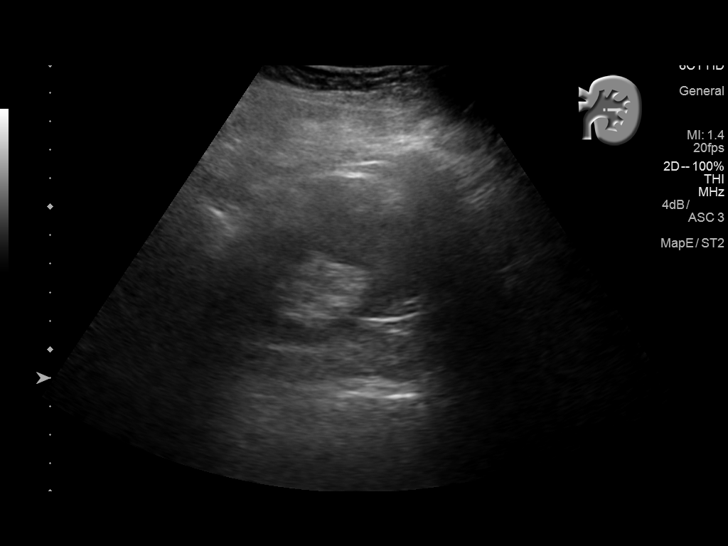
[im 115/115]
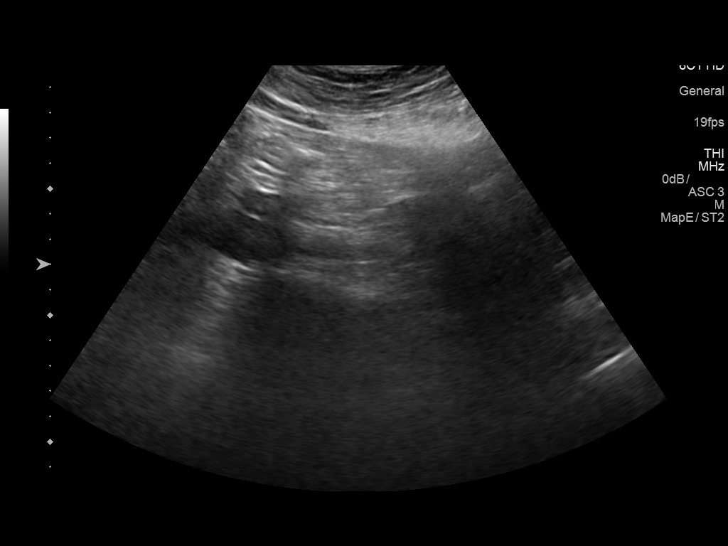

[13 of 25 positions shown; findings below may reference images not displayed]

FINDINGS: Gallbladder: No gallstones or wall thickening visualized. No
sonographic Murphy sign noted by sonographer.

Common bile duct: Diameter: 2.6 mm

Liver: The hepatic echotexture is normal with exception of an area
of increased echogenicity adjacent to the porta hepatis measuring
2.2 x 1.6 x 1.4 cm. There is no intrahepatic ductal dilation.

IVC: No abnormality visualized.

Pancreas: Visualized portion unremarkable.

Spleen: 5.2 cm in length. There is a hypoechoic focus with mixed
internal echoes measuring 3.2 x 3.2 x 2.9 cm.

Right Kidney: Length: 12.0. Echogenicity within normal limits. No
mass or hydronephrosis visualized.

Left Kidney: Length: 11.7. Echogenicity within normal limits. No
mass or hydronephrosis visualized.

Abdominal aorta: No aneurysm visualized.

Other findings: There is no ascites.
IMPRESSION: 1. No gallstones or sonographic evidence of acute cholecystitis.
Normal appearance of the pancreas
2. Area of increased echogenicity adjacent to the porta hepatis
measuring up to 2.2 cm. This likely reflects a hemangioma or
increased fat deposition. This was not visible on the previous
triphasic CT scan in [TE]. Hepatic protocol MRI is recommended.
3. Hypoechoic focus with internal echoes in the spleen measuring
x 3.2 x 2.9 cm. This may reflect a hemangioma. A 1.4 cm cystic
appearing lesion was observed here on the previous CT scan. This
could be evaluated further with the aforementioned MRI.
4. No kidney stones or hydronephrosis. Normal-appearing abdominal
aorta and IVC.

## 2015-11-27 ENCOUNTER — Other Ambulatory Visit (HOSPITAL_COMMUNITY): Payer: 59

## 2015-11-27 ENCOUNTER — Encounter: Payer: Self-pay | Admitting: Surgery

## 2015-12-01 ENCOUNTER — Other Ambulatory Visit: Payer: Self-pay | Admitting: Surgery

## 2015-12-01 DIAGNOSIS — R160 Hepatomegaly, not elsewhere classified: Secondary | ICD-10-CM

## 2015-12-04 ENCOUNTER — Encounter: Payer: 59 | Attending: Surgery | Admitting: Dietician

## 2015-12-04 ENCOUNTER — Encounter: Payer: Self-pay | Admitting: Dietician

## 2015-12-04 DIAGNOSIS — Z713 Dietary counseling and surveillance: Secondary | ICD-10-CM | POA: Insufficient documentation

## 2015-12-04 DIAGNOSIS — Z6841 Body Mass Index (BMI) 40.0 and over, adult: Secondary | ICD-10-CM | POA: Diagnosis not present

## 2015-12-04 NOTE — Progress Notes (Signed)
  Pre-Op Assessment Visit:  Pre-Operative RYGB Surgery  Medical Nutrition Therapy:  Appt start time: [id number]   End time:  0850.  Patient was seen on 12/04/2015 for Pre-Operative Nutrition Assessment. Assessment and letter of approval faxed to [facility] Bariatric Surgery Program coordinator on 12/04/2015.   Preferred Learning Style:   No preference indicated   Learning Readiness:   Ready  Handouts given during visit include:  Pre-Op Goals Bariatric Surgery Protein Shakes Vitamins and Minerals recommendations   During the appointment today the following Pre-Op Goals were reviewed with the patient: Maintain or lose weight as instructed by your surgeon Make healthy food choices Begin to limit portion sizes Limited concentrated sugars and fried foods Keep fat/sugar in the single digits per serving on   food labels Practice CHEWING your food  (aim for 30 chews per bite or until applesauce consistency) Practice not drinking 15 minutes before, during, and 30 minutes after each meal/snack Avoid all carbonated beverages  Avoid/limit caffeinated beverages  Avoid all sugar-sweetened beverages Consume 3 meals per day; eat every 3-5 hours Make a list of non-food related activities Aim for 64-100 ounces of FLUID daily  Aim for at least 60-80 grams of PROTEIN daily Look for a liquid protein source that contain ?15 g protein and ?5 g carbohydrate  (ex: shakes, drinks, shots)  Demonstrated degree of understanding via:  Teach Back  Teaching Method Utilized:  Visual Auditory Hands on  Barriers to learning/adherence to lifestyle change: none  Patient to call the [facility] to enroll in Pre-Op and Post-Op Nutrition Education when surgery date is scheduled.

## 2015-12-08 ENCOUNTER — Ambulatory Visit
Admission: RE | Admit: 2015-12-08 | Discharge: 2015-12-08 | Disposition: A | Discharge status: Home / Self Care | Payer: 59 | Source: Ambulatory Visit | Attending: Surgery | Admitting: Surgery

## 2015-12-08 DIAGNOSIS — R160 Hepatomegaly, not elsewhere classified: Secondary | ICD-10-CM

## 2015-12-08 MED ORDER — GADOBENATE DIMEGLUMINE 529 MG/ML IV SOLN
20.0000 mL | Freq: Once | INTRAVENOUS | Status: AC | PRN
  Administered 2015-12-08: 20 mL via INTRAVENOUS

## 2015-12-08 NOTE — Telephone Encounter (Signed)
This encounter was created in error - please disregard.

## 2015-12-09 ENCOUNTER — Ambulatory Visit (INDEPENDENT_AMBULATORY_CARE_PROVIDER_SITE_OTHER): Payer: 59 | Admitting: Psychology

## 2015-12-09 DIAGNOSIS — F509 Eating disorder, unspecified: Secondary | ICD-10-CM

## 2015-12-18 ENCOUNTER — Ambulatory Visit (INDEPENDENT_AMBULATORY_CARE_PROVIDER_SITE_OTHER): Payer: 59 | Admitting: Psychology

## 2015-12-18 DIAGNOSIS — F509 Eating disorder, unspecified: Secondary | ICD-10-CM

## 2016-06-10 ENCOUNTER — Telehealth: Payer: Self-pay | Admitting: Oncology

## 2016-06-10 ENCOUNTER — Telehealth: Payer: Self-pay | Admitting: *Deleted

## 2016-06-10 NOTE — Telephone Encounter (Signed)
Returned call to pt to confirm r/s appt to 9/11 at 0830 per LOS

## 2016-06-14 ENCOUNTER — Ambulatory Visit: Payer: 59 | Admitting: Oncology

## 2016-06-16 ENCOUNTER — Other Ambulatory Visit: Payer: Self-pay | Admitting: Obstetrics and Gynecology

## 2016-06-16 DIAGNOSIS — Z853 Personal history of malignant neoplasm of breast: Secondary | ICD-10-CM

## 2016-06-20 ENCOUNTER — Ambulatory Visit
Admission: RE | Admit: 2016-06-20 | Discharge: 2016-06-20 | Disposition: A | Discharge status: Home / Self Care | Payer: 59 | Source: Ambulatory Visit | Attending: Obstetrics and Gynecology | Admitting: Obstetrics and Gynecology

## 2016-06-20 DIAGNOSIS — Z853 Personal history of malignant neoplasm of breast: Secondary | ICD-10-CM

## 2016-06-20 HISTORY — DX: Personal history of irradiation: Z92.3

## 2016-08-01 ENCOUNTER — Other Ambulatory Visit: Payer: Self-pay | Admitting: Oncology

## 2016-08-01 DIAGNOSIS — C50412 Malignant neoplasm of upper-outer quadrant of left female breast: Secondary | ICD-10-CM

## 2016-08-01 DIAGNOSIS — C50419 Malignant neoplasm of upper-outer quadrant of unspecified female breast: Secondary | ICD-10-CM

## 2016-11-21 ENCOUNTER — Other Ambulatory Visit: Payer: Self-pay

## 2016-11-21 DIAGNOSIS — C50412 Malignant neoplasm of upper-outer quadrant of left female breast: Secondary | ICD-10-CM

## 2016-11-22 ENCOUNTER — Other Ambulatory Visit (HOSPITAL_BASED_OUTPATIENT_CLINIC_OR_DEPARTMENT_OTHER): Payer: 59

## 2016-11-22 ENCOUNTER — Ambulatory Visit (HOSPITAL_BASED_OUTPATIENT_CLINIC_OR_DEPARTMENT_OTHER): Payer: 59 | Admitting: Oncology

## 2016-11-22 VITALS — BP 155/85 | HR 59 | Temp 98.6°F | Resp 20 | Ht 67.0 in | Wt 253.7 lb

## 2016-11-22 DIAGNOSIS — Z170 Estrogen receptor positive status [ER+]: Secondary | ICD-10-CM | POA: Diagnosis not present

## 2016-11-22 DIAGNOSIS — N951 Menopausal and female climacteric states: Secondary | ICD-10-CM | POA: Diagnosis not present

## 2016-11-22 DIAGNOSIS — C50912 Malignant neoplasm of unspecified site of left female breast: Secondary | ICD-10-CM

## 2016-11-22 DIAGNOSIS — R05 Cough: Secondary | ICD-10-CM

## 2016-11-22 DIAGNOSIS — C50412 Malignant neoplasm of upper-outer quadrant of left female breast: Secondary | ICD-10-CM

## 2016-11-22 DIAGNOSIS — Z807 Family history of other malignant neoplasms of lymphoid, hematopoietic and related tissues: Secondary | ICD-10-CM

## 2016-11-22 LAB — COMPREHENSIVE METABOLIC PANEL
ALT: 12 U/L (ref 0–55)
AST: 15 U/L (ref 5–34)
Albumin: 3.6 g/dL (ref 3.5–5.0)
Alkaline Phosphatase: 17 U/L — ABNORMAL LOW (ref 40–150)
Anion Gap: 8 mEq/L (ref 3–11)
BUN: 8.8 mg/dL (ref 7.0–26.0)
CHLORIDE: 104 meq/L (ref 98–109)
CO2: 29 mEq/L (ref 22–29)
CREATININE: 0.8 mg/dL (ref 0.6–1.1)
Calcium: 9.4 mg/dL (ref 8.4–10.4)
EGFR: 81 mL/min/{1.73_m2} — ABNORMAL LOW (ref >90)
Glucose: 91 mg/dl (ref 70–140)
POTASSIUM: 4 meq/L (ref 3.5–5.1)
Sodium: 141 mEq/L (ref 136–145)
Total Bilirubin: 0.42 mg/dL (ref 0.20–1.20)
Total Protein: 7.2 g/dL (ref 6.4–8.3)

## 2016-11-22 LAB — CBC WITH DIFFERENTIAL/PLATELET
BASO%: 0.4 % (ref 0.0–2.0)
Basophils Absolute: 0 10*3/uL (ref 0.0–0.1)
EOS%: 2.2 % (ref 0.0–7.0)
Eosinophils Absolute: 0.1 10*3/uL (ref 0.0–0.5)
HCT: 37.9 % (ref 34.8–46.6)
HGB: 12.2 g/dL (ref 11.6–15.9)
LYMPH%: 32.4 % (ref 14.0–49.7)
MCH: 28.6 pg (ref 25.1–34.0)
MCHC: 32.2 g/dL (ref 31.5–36.0)
MCV: 88.8 fL (ref 79.5–101.0)
MONO#: 0.3 10*3/uL (ref 0.1–0.9)
MONO%: 6 % (ref 0.0–14.0)
NEUT#: 3.3 10*3/uL (ref 1.5–6.5)
NEUT%: 59 % (ref 38.4–76.8)
PLATELETS: 237 10*3/uL (ref 145–400)
RBC: 4.27 10*6/uL (ref 3.70–5.45)
RDW: 13.3 % (ref 11.2–14.5)
WBC: 5.5 10*3/uL (ref 3.9–10.3)
lymph#: 1.8 10*3/uL (ref 0.9–3.3)

## 2016-11-22 NOTE — Progress Notes (Signed)
ID: [name] [name] [name]   DOB: 1965-11-13  MR#: [id number]  AOZ#:[id number]  PCP: [name] Lass, MD GYN: SU: [name] [name] [name] OTHER MD: [name] [name] [name],   CHIEF COMPLAINT: left breast cancer   CURRENT TREATMENT: tamoxifen 82m daily  BREAST CANCER HISTORY: From the original intake note:  [name] [name] [name] a 51year old [place] who underwent a yearly screening mammography in 02/11/2011. This showed heterogeneously dense tissue and a possible mass in the left breast. Additional views 03/21/2011 showed an area of amorphous calcifications in the upper central aspect of the left breast, measuring up to 3.0 cm. There was no associated mass. Biopsy was performed the same day and showed (([id number] invasive ductal breast cancer, grade 1-2, with ductal carcinoma in situ, grade 1-3. The invasive tumor was 57% estrogen receptor positive, 97% progesterone receptor positive, with an MIB-1 of 10% and no HER-2 amplification.  With this information the patient proceeded to bilateral breast MRIs 03/31/2011 this showed a round enhancing mass with irregular margins measuring 6 mm, with an associated biopsy clip. There was additional clumped and linear enhancement of compatible with DCIS the total area measuring 3.1 cm. There were no other suspicious masses in either breast and there was no axillary or internal mammary adenopathy noted.   Her subsequent history is as detailed below.  INTERVAL HISTORY:  [name] [name] [name] for follow-up and treatment of her estrogen receptor positive breast cancer. She continues on tamoxifen, which she generally tolerates well. She is on Celexa so she has had mild hot flashes. She has vaginal dryness typically prior to her menstrual cycle. Pt LMP was 10/07/2016 and is now irregular.   Pt was recently evaluated by her Gynecologist, [name] [name] [name] possible oophorectomy and her Gynecologist would like the patient to be tested for BRCA gene. If the patient is positive for  BRCA then Dr. RMarvel [name] [name] consider oophorectomy.    REVIEW OF SYSTEMS: She is ambulating daily with her dogs. Pt wears a Fitbit and ambulates 3 miles daily. She reports intermittent HA that typically occurs at approximately 3AM and wakes her from her sleep. Pt HA is alleviated with standing. She reports that she had a severe HA localized to the top of her scalp with an episode of emesis approximately 2 weeks ago. She had to stand for roughly 2 hours to alleviate her HA. Pt reports diaphoresis. Denies vision change. Pt reports that she has a hx of seasonal allergies that she takes zyrtec and sudafed daily. Pt reports daily productive cough following a virus 2 weeks ago that was treated. Denies left breast pain. Denies bowel/bladder issues. A detailed review of systems [name] [name] was otherwise noncontributory  PAST MEDICAL HISTORY: Past Medical History:  Diagnosis Date  . Anemia    not at present  . Arthritis    Osteoarthritis Neck and Back  . Cancer ([facility]    Left breast cancer  . DJD (degenerative joint disease)    Cervical and lumbar spin  . Hx of seizure disorder    Remote hx  . Personal history of radiation therapy    2017 left breast  . S/P radiation therapy 06/13/2011 - 07/22/11   Left Breast/ 50 Gy in 25 Fractions: Left Breast Boost Boost/ 10 Gy in 5 Fractions  . Seasonal allergies   . 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 ER+, PR+, Ki-67 10%, Her2- No Amplification  She had a right breast biopsy remotely, which  was benign. She has mild degenerative disc disease involving the cervical and lumbar spines. She status post lap band surgery November of 2010, and has lost 92 pounds so far. She has a history of seasonal allergies, hypertension which is improving eyes she continues to lose weight, history of postpartum migraines, resolved, history of seizure, remote, and is status post C-section x2  and dilatation and curettage times one   PAST SURGICAL HISTORY: Past Surgical History:  Procedure Laterality Date  . BIOPSY BREAST  08/07/09   Breast, Right Needle core Biopsy, UOQ - Pseudoangiomatous Stromal hyperplasia  . BREAST BIOPSY  03/21/11  . BREAST LUMPECTOMY  04/19/11   snbx  . BREAST LUMPECTOMY  05/10/11   Left Breast Lumpectomy re-excision  . BREAST REDUCTION SURGERY  02/15/2012   Procedure: MAMMARY REDUCTION  (BREAST);  Surgeon: [name] Kos, DO;  Location: [place];  Service: Plastics;  Laterality: Right;  RIGHT BREAST MASTOPEXY  . [place], 2005  . DILATION AND CURETTAGE OF UTERUS     X 1  . LAPAROSCOPIC GASTRIC BANDING  01/27/2009    FAMILY HISTORY Family History  Problem Relation Age of Onset  . Stroke Mother        Stroke - mini  . Cancer Father        Lymphoma - passed age 59  The patient's father died at the age of 23 from sepsis in the setting of lymphoma. The patient's mother died at the age of 13 after a heart attack in the setting of multiple strokes. The patient had no brothers. She has 3 sisters. There is no history of breast or ovarian cancer in the family  GYNECOLOGIC HISTORY: Menarche age 37, first pregnancy to term age 34. She is GX P2. As noted above, she still having periods through tamoxifen, although they are slightly less frequent and briefer. 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 She divorced her first husband, with 62 and 29-year-old sons at home, with shared custody. She married Jessyka Austria in 2014. He works as a Medical illustrator. She has 2 daughters from a prior marriage, age 61 and 51. She attends [employer] locally    ADVANCED DIRECTIVES: not in place  HEALTH MAINTENANCE: Social History  Substance Use Topics  . Smoking status: Never Smoker  . Smokeless tobacco: Never Used  . Alcohol use Yes     Comment: rarely      Colonoscopy:  PAP:  Bone density:  Lipid panel:  Allergies  Allergen Reactions  . Prednisolone Hives    Current Outpatient Prescriptions  Medication Sig Dispense Refill  . acetaminophen (TYLENOL) 325 MG tablet Take 650 mg by mouth every 6 (six) hours as needed.    . Biotin 5000 MCG CAPS Take 1 capsule by mouth every morning.    . Calcium Carbonate-Vitamin D (CALCIUM PLUS VITAMIN D PO) Take by mouth daily.    . cetirizine (ZYRTEC) 10 MG tablet Take 10 mg by mouth as needed.     . citalopram (CELEXA) 10 MG tablet Take 20 mg by mouth daily.    . ferrous sulfate 325 (65 FE) MG tablet Take 325 mg by mouth daily.     Marland Kitchen ibuprofen (ADVIL,MOTRIN) 200 MG tablet Take 200 mg by mouth every 6 (six) hours as needed. Reported on 07/21/2015    . lansoprazole (PREVACID) 30 MG capsule Take 30 mg by mouth daily at 12 noon.    Marland Kitchen  MULTIPLE VITAMIN PO Take 1 tablet by mouth.    . NON FORMULARY     . phenylephrine (SUDAFED PE) 10 MG TABS Take 10 mg by mouth every 4 (four) hours as needed.    . tamoxifen (NOLVADEX) 20 MG tablet TAKE 1 TABLET DAILY 90 tablet 3   No current facility-administered medications for this visit.     OBJECTIVE: Middle-aged white woman In no acute distress  Vitals:   11/22/16 0909  BP: (!) 155/85  Pulse: (!) 59  Resp: 20  Temp: 98.6 F (37 C)  SpO2: 98%     Body mass index is 39.74 kg/m.    ECOG FS: 0 Filed Weights   11/22/16 0909  Weight: 253 lb 11.2 oz (115.1 kg)   Sclerae unicteric, EOMs intact Oropharynx clear and moist No cervical or supraclavicular adenopathy Lungs no rales or rhonchi Heart regular rate and rhythm Abd soft, nontender, positive bowel sounds MSK no focal spinal tenderness, no upper extremity lymphedema Neuro: nonfocal, well oriented, appropriate affect Breasts: The right breast is benign. The left breast is status post lumpectomy and radiation. There is some distortion of the breast contour but no evidence of local recurrence. Both axillae  are benign.  LAB RESULTS: Labs from 08/16/2013 scanned into EPIC, all values WNL except HGB 9.8 HCT 31.3 (low).  Lab Results  Component Value Date   WBC 5.5 11/22/2016   NEUTROABS 3.3 11/22/2016   HGB 12.2 11/22/2016   HCT 37.9 11/22/2016   MCV 88.8 11/22/2016   PLT 237 11/22/2016      Chemistry      Component Value Date/Time   NA 142 07/14/2015 0750   K 4.3 07/14/2015 0750   CL 105 05/01/2012 1044   CO2 29 07/14/2015 0750   BUN 10.5 07/14/2015 0750   CREATININE 0.9 07/14/2015 0750      Component Value Date/Time   CALCIUM 9.4 07/14/2015 0750   ALKPHOS 16 (L) 07/14/2015 0750   AST 15 07/14/2015 0750   ALT 15 07/14/2015 0750   BILITOT 0.40 07/14/2015 0750       Lab Results  Component Value Date   LABCA2 23 04/05/2011    No components found for: [id number]  No results for input(s): INR in the last 168 hours.  Urinalysis No results found for: COLORURINE    STUDIES: Bilateral diagnostic mammography with tomography on 06/20/2016 at the [facility] showed the breast density to be category B. There was no evidence of malignancy.    ASSESSMENT: 51 y.o.  [place] woman status post Left lumpectomy and sentinel lymph node sampling 04/19/2011 for a T2 N0, stage IIA invasive ductal carcinoma, grade 1, estrogen and progesterone receptor positive at 57% and 97% respectively, HER-2 negative, with an MIB-1 of 10.  (1) Her Oncotype Dx predicts a distant recurrence rate of 13% if her only systemic treatment is tamoxifen for 5 years.  (2) completed radiation 07/22/2011  (3) started tamoxifen May 2013--plan is for 10 years of antiestrogen therapy  (4) genetics testing offered to patient and rediscussed September 2016: Patient opts against it   PLAN:   [name] [name] [name] Is now 5-1/2 years out from definitive surgery for her breast cancer with no evidence of disease recurrence. This is very favorable.  She is interested in genetics testing. This assumes of course her insurance will cover  it. I'm setting her up to meet with our genetics counselors within the next month or so to get that accomplished.  Of course if she proves to carry  a deleterious mutation she will need to have bilateral salpingo-oophorectomy. Otherwise there is no indication for that procedure.  The plan for now is to continue tamoxifen for a total of 10 years. If she does become clearly postmenopausal for any reason we can switch to anastrozole and then she can finish with just 2 years on that medication  She knows to call for any problems that may develop before the next visit here.  MAGRINAT,GUSTAV C    11/22/2016

## 2016-12-12 ENCOUNTER — Encounter: Payer: Self-pay | Admitting: Genetic Counselor

## 2016-12-12 ENCOUNTER — Ambulatory Visit (HOSPITAL_BASED_OUTPATIENT_CLINIC_OR_DEPARTMENT_OTHER): Payer: 59 | Admitting: Genetic Counselor

## 2016-12-12 ENCOUNTER — Other Ambulatory Visit: Payer: 59

## 2016-12-12 DIAGNOSIS — Z808 Family history of malignant neoplasm of other organs or systems: Secondary | ICD-10-CM | POA: Diagnosis not present

## 2016-12-12 DIAGNOSIS — Z170 Estrogen receptor positive status [ER+]: Secondary | ICD-10-CM

## 2016-12-12 DIAGNOSIS — Z315 Encounter for genetic counseling: Secondary | ICD-10-CM | POA: Diagnosis not present

## 2016-12-12 DIAGNOSIS — Z8049 Family history of malignant neoplasm of other genital organs: Secondary | ICD-10-CM | POA: Insufficient documentation

## 2016-12-12 DIAGNOSIS — C50412 Malignant neoplasm of upper-outer quadrant of left female breast: Secondary | ICD-10-CM

## 2016-12-12 NOTE — Progress Notes (Signed)
REFERRING PROVIDER: Chauncey Cruel, MD [address] [place], [place] [address]  PRIMARY PROVIDER:  Kathyrn Lass, MD  PRIMARY REASON FOR VISIT:  1. Malignant neoplasm of upper-outer quadrant of left breast in female, estrogen receptor positive ([place])   2. Family history of uterine cancer      HISTORY OF PRESENT ILLNESS:   [name] [name], a 51 y.o. female, was seen for a [facility] cancer genetics consultation at the request of [name] [name] due to a personal and family history of cancer.  [name] [name] presents to clinic today to discuss the possibility of a hereditary predisposition to cancer, genetic testing, and to further clarify her future cancer risks, as well as potential cancer risks for family members.   In 2013, at the age of 5, [name] [name] was diagnosed with Invasive ductal carcinoma of the left breast. This was treated with two lumpectomies, radiation and tamoxifen.  The patient was recommended to have her ovaries out due to her ER+ breast cancer.  However, her GYN does not want to do the procedure unless she has genetic testing that indicates she has an increased risk for ovarian cancer.     CANCER HISTORY:    Malignant neoplasm of upper-outer quadrant of left breast in female, estrogen receptor positive ([place])   03/21/2011 Initial Biopsy    Left breast core needle bx: IDC, grade 1-2; DCIS, grade 1-3 with associated necrosis and calcifications      03/31/2011 Breast MRI    A round, enhancing mass with irregular margins is imaged in the 12 o'clock position of the left breast, anteriorly measuring 0.4 x 0.5 x 0.6 cm with associated biopsy clip.      03/31/2011 Clinical Stage    Stage IIA: T2 N0      04/19/2011 Definitive Surgery    Left lumpectomy/SLNB: invasive ductal carcinoma, grade 1, ER+ (57%), PR+ (97%), Her2/neu negative (ratio 1.28), Ki67 10%; 0/4 LN      04/19/2011 Pathologic Stage    Stage IIA: T2 N0      04/19/2011 Oncotype testing    RS 20 (13% ROR)       06/13/2011 - 07/22/2011 Radiation Therapy    Adjuvant RT: left breast: Left Breast / 50 Gy in 25 fractions. Left Breast boost / 10 Gy in 5 fractions.      07/2011 -  Anti-estrogen oral therapy    Tamoxifen 20 mg daily. Planned duration of therapy 10 years       Procedure    Genetic testing - pt declined        HORMONAL RISK FACTORS:  Menarche was at age 24.  First live birth at age 32.  OCP use for approximately 0 years.  Ovaries intact: yes.  Hysterectomy: no.  Menopausal status: postmenopausal.  HRT use: 0 years. Colonoscopy: no; not examined. Mammogram within the last year: yes. Number of breast biopsies: 2. Up to date with pelvic exams:  yes. Any excessive radiation exposure in the past:  For breast cancer treatment  Past Medical History:  Diagnosis Date  . Anemia    not at present  . Arthritis    Osteoarthritis Neck and Back  . Cancer [facility])    Left breast cancer  . DJD (degenerative joint disease)    Cervical and lumbar spin  . Family history of uterine cancer   . Hx of seizure disorder    Remote hx  . Personal history of radiation therapy    2017 left breast  . S/P  radiation therapy 06/13/2011 - 07/22/11   Left Breast/ 50 Gy in 25 Fractions: Left Breast Boost Boost/ 10 Gy in 5 Fractions  . Seasonal allergies   . 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 ER+, PR+, Ki-67 10%, Her2- No Amplification    Past Surgical History:  Procedure Laterality Date  . BIOPSY BREAST  08/07/09   Breast, Right Needle core Biopsy, UOQ - Pseudoangiomatous Stromal hyperplasia  . BREAST BIOPSY  03/21/11  . BREAST LUMPECTOMY  04/19/11   snbx  . BREAST LUMPECTOMY  05/10/11   Left Breast Lumpectomy re-excision  . BREAST REDUCTION SURGERY  02/15/2012   Procedure: MAMMARY REDUCTION  (BREAST);  Surgeon: [name] Kos, DO;  Location: [facility];  Service: Plastics;  Laterality:  Right;  RIGHT BREAST MASTOPEXY  . [place], 2005  . DILATION AND CURETTAGE OF UTERUS     X 1  . LAPAROSCOPIC GASTRIC BANDING  01/27/2009    Social History   Social History  . Marital status: Married    Spouse name: N/A  . Number of children: N/A  . Years of education: N/A   Social History Main Topics  . Smoking status: Never Smoker  . Smokeless tobacco: Never Used  . Alcohol use Yes     Comment: rarely  . Drug use: No  . Sexual activity: Not on file     Comment: Menarche age [age], G3, P2, 1 miscarriage, Parity 70 or 12, no BC   Other Topics Concern  . Not on file   Social History Narrative   Menarche age [age], G53, P67, 1 miscarriage, Parity 61 or 5, no BC     FAMILY HISTORY:  We obtained a detailed, 4-generation family history.  Significant diagnoses are listed below: Family History  Problem Relation Age of Onset  . Stroke Mother        Stroke - mini  . Cancer Father        Lymphoma - passed age 3  . Skin cancer Father   . Skin cancer Sister   . Uterine cancer Sister 72    The patient's family history knowledge is somewhat limited as her extended family either died during [place] or currently live in [place].  The patient ha two sons who are cancer free.  She has three sisters.  One sister had a Ehrhardt on her back in her 45's, and a second sister had uterine cancer at age 47.  Both parents are deceased.  Her mother died at 52 from a heart attack and her father died from [place].  Her mother had many siblings.  The patient is aware of one sister who is living at 54 and had no children.  She knows of 2 brothers, who she never met.  Others were killed during WWII.    The patient's father had a brother and sister that the patient knows about.  She is not aware of cancer.  [name] is unaware of previous family history of genetic testing for hereditary cancer risks. Patient's maternal ancestors are of [place] descent, and paternal ancestors are of  [place] descent. There is no reported Ashkenazi Jewish ancestry. There is no known consanguinity.  GENETIC COUNSELING ASSESSMENT: [name] [name] is a 51 y.o. female with a personal history of breast cancer and family history of uterine cancer which is somewhat suggestive of a hereditary cancer syndrome and predisposition to cancer. We,  therefore, discussed and recommended the following at today's visit.   DISCUSSION: We discussed that about 5-10% of breast cancer is hereditary with most cases due to BRCA mutations.  Other genes associated with hereditary breast cancer syndromes include ATM, CHEK2 and PALB2.  About 3-5% of uterine cancer is hereditary with most cases due to Lynch syndrome.  One gene that are associated with both breast and uterine cancer include PTEN.We reviewed the characteristics, features and inheritance patterns of hereditary cancer syndromes. We also discussed genetic testing, including the appropriate family members to test, the process of testing, insurance coverage and turn-around-time for results. We discussed the implications of a negative, positive and/or variant of uncertain significant result. We recommended [name] pursue genetic testing for the Common Hereditary cancer gene panel. The Hereditary Gene Panel offered by [facility] includes sequencing and/or deletion duplication testing of the following 46 genes: APC, ATM, AXIN2, BARD1, BMPR1A, BRCA1, BRCA2, BRIP1, CDH1, CDKN2A (p14ARF), CDKN2A (p16INK4a), CHEK2, CTNNA1, DICER1, EPCAM (Deletion/duplication testing only), GREM1 (promoter region deletion/duplication testing only), KIT, MEN1, MLH1, MSH2, MSH3, MSH6, MUTYH, NBN, NF1, NHTL1, PALB2, PDGFRA, PMS2, POLD1, POLE, PTEN, RAD50, RAD51C, RAD51D, SDHB, SDHC, SDHD, SMAD4, SMARCA4. STK11, TP53, TSC1, TSC2, and VHL.  The following genes were evaluated for sequence changes only: SDHA and HOXB13 c.251G>A variant only.  Based on [name]'s personal and family history of cancer,  she meets medical criteria for genetic testing. Despite that she meets criteria, she may still have an out of pocket cost. We discussed that if her out of pocket cost for testing is over $100, the laboratory will call and confirm whether she wants to proceed with testing.  If the out of pocket cost of testing is less than $100 she will be billed by the genetic testing laboratory.   PLAN: After considering the risks, benefits, and limitations, [name]  provided informed consent to pursue genetic testing and the blood sample was sent to [facility] for analysis of the Common Hereditary Cancer Panel. Results should be available within approximately 2-3 weeks' time, at which point they will be disclosed by telephone to [name], as will any additional recommendations warranted by these results. [name] will receive a summary of her genetic counseling visit and a copy of her results once available. This information will also be available in Epic. We encouraged [name] to remain in contact with cancer genetics annually so that we can continuously update the family history and inform her of any changes in cancer genetics and testing that may be of benefit for her family. [name] questions were answered to her satisfaction today. Our contact information was provided should additional questions or concerns arise.  Lastly, we encouraged [name] to remain in contact with cancer genetics annually so that we can continuously update the family history and inform her of any changes in cancer genetics and testing that may be of benefit for this family.   Ms.  [name] [name] questions were answered to her satisfaction today. Our contact information was provided should additional questions or concerns arise. Thank you for the referral and allowing [place] to share in the care of your patient.   [name] [name], [place], [facility] Certified Genetic Counselor [name] phone:  [phone]  The patient was seen for a total of 45 minutes in face-to-face genetic counseling.  This patient was discussed with Drs. Magrinat, Lindi Adie and/or Burr Medico who agrees with the above.    _______________________________________________________________________ For Office Staff:  Number of people involved in session: 1  Was an Intern/ student involved with case: no

## 2016-12-30 NOTE — Telephone Encounter (Signed)
No entry 

## 2017-01-06 ENCOUNTER — Ambulatory Visit: Payer: Self-pay | Admitting: Genetic Counselor

## 2017-01-06 ENCOUNTER — Telehealth: Payer: Self-pay | Admitting: Genetic Counselor

## 2017-01-06 DIAGNOSIS — Z8049 Family history of malignant neoplasm of other genital organs: Secondary | ICD-10-CM

## 2017-01-06 DIAGNOSIS — Z1379 Encounter for other screening for genetic and chromosomal anomalies: Secondary | ICD-10-CM

## 2017-01-06 NOTE — Telephone Encounter (Signed)
Revealed negative genetic testing.  Discussed that we do not know why she has breast cancer or why there is cancer in the family. It could be due to a different gene that we are not testing, or maybe our current technology may not be able to pick something up.  It will be important for her to keep in contact with genetics to keep up with whether additional testing may be needed.  Revealed VUS was found.  We will not change medical management.

## 2017-01-06 NOTE — Progress Notes (Signed)
HPI: Ms. Hagemeister was previously seen in the [facility] due to a personal and family history of cancer and concerns regarding a hereditary predisposition to cancer. Please refer to our prior cancer genetics clinic note for more information regarding Ms. Vanderheyden's medical, social and family histories, and our assessment and recommendations, at the time. Ms. Suchecki recent genetic test results were disclosed to her, as were recommendations warranted by these results. These results and recommendations are discussed in more detail below.  CANCER HISTORY:    Malignant neoplasm of upper-outer quadrant of left breast in female, estrogen receptor positive ([place])   03/21/2011 Initial Biopsy    Left breast core needle bx: IDC, grade 1-2; DCIS, grade 1-3 with associated necrosis and calcifications      03/31/2011 Breast MRI    A round, enhancing mass with irregular margins is imaged in the 12 o'clock position of the left breast, anteriorly measuring 0.4 x 0.5 x 0.6 cm with associated biopsy clip.      03/31/2011 Clinical Stage    Stage IIA: T2 N0      04/19/2011 Definitive Surgery    Left lumpectomy/SLNB: invasive ductal carcinoma, grade 1, ER+ (57%), PR+ (97%), Her2/neu negative (ratio 1.28), Ki67 10%; 0/4 LN      04/19/2011 Pathologic Stage    Stage IIA: T2 N0      04/19/2011 Oncotype testing    RS 20 (13% ROR)      06/13/2011 - 07/22/2011 Radiation Therapy    Adjuvant RT: left breast: Left Breast / 50 Gy in 25 fractions. Left Breast boost / 10 Gy in 5 fractions.      07/2011 -  Anti-estrogen oral therapy    Tamoxifen 20 mg daily. Planned duration of therapy 10 years      01/04/2017 Genetic Testing    BRCA1 c.343C>A (p.Pro115Thr) VUS identified on the multi-gene panel.  The Multi-Gene Panel offered by [facility] includes sequencing and/or deletion duplication testing of the following 80 genes: ALK, APC, ATM, AXIN2,BAP1,  BARD1, BLM, BMPR1A, BRCA1, BRCA2, BRIP1, CASR, CDC73,  CDH1, CDK4, CDKN1B, CDKN1C, CDKN2A (p14ARF), CDKN2A (p16INK4a), CEBPA, CHEK2, CTNNA1, DICER1, DIS3L2, EGFR (c.2369C>T, p.Thr790Met variant only), EPCAM (Deletion/duplication testing only), FH, FLCN, GATA2, GPC3, GREM1 (Promoter region deletion/duplication testing only), HOXB13 (c.251G>A, p.Gly84Glu), HRAS, KIT, MAX, MEN1, MET, MITF (c.952G>A, p.Glu318Lys variant only), MLH1, MSH2, MSH3, MSH6, MUTYH, NBN, NF1, NF2, NTHL1, PALB2, PDGFRA, PHOX2B, PMS2, POLD1, POLE, POT1, PRKAR1A, PTCH1, PTEN, RAD50, RAD51C, RAD51D, RB1, RECQL4, RET, RUNX1, SDHAF2, SDHA (sequence changes only), SDHB, SDHC, SDHD, SMAD4, SMARCA4, SMARCB1, SMARCE1, STK11, SUFU, TERT, TERT, TMEM127, TP53, TSC1, TSC2, VHL, WRN and WT1.  The report date is January 04, 2017.        FAMILY HISTORY:  We obtained a detailed, 4-generation family history.  Significant diagnoses are listed below: Family History  Problem Relation Age of Onset  . Stroke Mother        Stroke - mini  . Cancer Father        Lymphoma - passed age 53  . Skin cancer Father   . Skin cancer Sister   . Uterine cancer Sister 63    The patient's family history knowledge is somewhat limited as her extended family either died during [place] or currently live in [place].  The patient ha two sons who are cancer free.  She has three sisters.  One sister had a [place] on her back in her 62's, and a second sister had uterine cancer at age 5.  Both parents are deceased.  Her mother died at 13 from a heart attack and her father died from [place].  Her mother had many siblings.  The patient is aware of one sister who is living at 73 and had no children.  She knows of 2 brothers, who she never met.  Others were killed during WWII.    The patient's father had a brother and sister that the patient knows about.  She is not aware of cancer.  Ms. Croft is unaware of previous family history of genetic testing for hereditary cancer risks. Patient's maternal ancestors are of [place]  descent, and paternal ancestors are of [place] descent. There is no reported Ashkenazi Jewish ancestry. There is no known consanguinity.  GENETIC TEST RESULTS: Genetic testing reported out on January 04, 2017 through the multi-gene cancer panel found no deleterious mutations.  The Multi-Gene Panel offered by [facility] includes sequencing and/or deletion duplication testing of the following 80 genes: ALK, APC, ATM, AXIN2,BAP1,  BARD1, BLM, BMPR1A, BRCA1, BRCA2, BRIP1, CASR, CDC73, CDH1, CDK4, CDKN1B, CDKN1C, CDKN2A (p14ARF), CDKN2A (p16INK4a), CEBPA, CHEK2, CTNNA1, DICER1, DIS3L2, EGFR (c.2369C>T, p.Thr790Met variant only), EPCAM (Deletion/duplication testing only), FH, FLCN, GATA2, GPC3, GREM1 (Promoter region deletion/duplication testing only), HOXB13 (c.251G>A, p.Gly84Glu), HRAS, KIT, MAX, MEN1, MET, MITF (c.952G>A, p.Glu318Lys variant only), MLH1, MSH2, MSH3, MSH6, MUTYH, NBN, NF1, NF2, NTHL1, PALB2, PDGFRA, PHOX2B, PMS2, POLD1, POLE, POT1, PRKAR1A, PTCH1, PTEN, RAD50, RAD51C, RAD51D, RB1, RECQL4, RET, RUNX1, SDHAF2, SDHA (sequence changes only), SDHB, SDHC, SDHD, SMAD4, SMARCA4, SMARCB1, SMARCE1, STK11, SUFU, TERT, TERT, TMEM127, TP53, TSC1, TSC2, VHL, WRN and WT1.  The test report has been scanned into EPIC and is located under the Molecular Pathology section of the Results Review tab.   We discussed with Ms. Yonker that since the current genetic testing is not perfect, it is possible there may be a gene mutation in one of these genes that current testing cannot detect, but that chance is small. We also discussed, that it is possible that another gene that has not yet been discovered, or that we have not yet tested, is responsible for the cancer diagnoses in the family, and it is, therefore, important to remain in touch with cancer genetics in the future so that we can continue to offer Ms. Guardado the most up to date genetic testing.   Genetic testing did detect a Variant of Unknown Significance in the  BRCA1 gene called c.343C>A (p.Pro115Thr). At this time, it is unknown if this variant is associated with increased cancer risk or if this is a normal finding, but most variants such as this get reclassified to being inconsequential. It should not be used to make medical management decisions. With time, we suspect the lab will determine the significance of this variant, if any. If we do learn more about it, we will try to contact Ms. Taflinger to discuss it further. However, it is important to stay in touch with [place] periodically and keep the address and phone number up to date.     CANCER SCREENING RECOMMENDATIONS: This result is reassuring and indicates that Ms. Stammer likely does not have an increased risk for a future cancer due to a mutation in one of these genes. This normal test also suggests that Ms. Granados's cancer was most likely not due to an inherited predisposition associated with one of these genes.  Most cancers happen by chance and this negative test suggests that her cancer falls into this category.  We, therefore, recommended she continue to follow the  cancer management and screening guidelines provided by her oncology and primary healthcare provider.   RECOMMENDATIONS FOR FAMILY MEMBERS: Women in this family might be at some increased risk of developing cancer, over the general population risk, simply due to the family history of cancer. We recommended women in this family have a yearly mammogram beginning at age 24, or 82 years younger than the earliest onset of cancer, an annual clinical breast exam, and perform monthly breast self-exams. Women in this family should also have a gynecological exam as recommended by their primary provider. All family members should have a colonoscopy by age 23.  FOLLOW-UP: Lastly, we discussed with Ms. Gribble that cancer genetics is a rapidly advancing field and it is possible that new genetic tests will be appropriate for her and/or her family members in  the future. We encouraged her to remain in contact with cancer genetics on an annual basis so we can update her personal and family histories and let her know of advances in cancer genetics that may benefit this family.   Our contact number was provided. Ms. Bayly questions were answered to her satisfaction, and she knows she is welcome to call us at anytime with additional questions or concerns.   Roma Kayser, MS, [facility] Certified Genetic Counselor [email or web address]

## 2017-05-26 ENCOUNTER — Other Ambulatory Visit: Payer: Self-pay | Admitting: Obstetrics and Gynecology

## 2017-05-26 DIAGNOSIS — Z1231 Encounter for screening mammogram for malignant neoplasm of breast: Secondary | ICD-10-CM

## 2017-06-26 ENCOUNTER — Ambulatory Visit
Admission: RE | Admit: 2017-06-26 | Discharge: 2017-06-26 | Disposition: A | Discharge status: Home / Self Care | Payer: 59 | Source: Ambulatory Visit | Attending: Obstetrics and Gynecology | Admitting: Obstetrics and Gynecology

## 2017-06-26 DIAGNOSIS — Z1231 Encounter for screening mammogram for malignant neoplasm of breast: Secondary | ICD-10-CM

## 2017-06-26 HISTORY — DX: Malignant neoplasm of unspecified site of unspecified female breast: C50.919

## 2017-08-14 ENCOUNTER — Other Ambulatory Visit: Payer: Self-pay | Admitting: Hematology and Oncology

## 2017-08-14 DIAGNOSIS — C50412 Malignant neoplasm of upper-outer quadrant of left female breast: Secondary | ICD-10-CM

## 2017-08-14 DIAGNOSIS — C50419 Malignant neoplasm of upper-outer quadrant of unspecified female breast: Secondary | ICD-10-CM

## 2017-09-18 ENCOUNTER — Telehealth: Payer: Self-pay | Admitting: Oncology

## 2017-09-18 NOTE — Telephone Encounter (Signed)
Called patient regarding 9/11

## 2017-11-21 ENCOUNTER — Other Ambulatory Visit: Payer: Self-pay | Admitting: *Deleted

## 2017-11-21 DIAGNOSIS — C50412 Malignant neoplasm of upper-outer quadrant of left female breast: Secondary | ICD-10-CM

## 2017-11-21 DIAGNOSIS — Z170 Estrogen receptor positive status [ER+]: Principal | ICD-10-CM

## 2017-11-22 ENCOUNTER — Ambulatory Visit: Payer: Self-pay | Admitting: Oncology

## 2017-11-22 ENCOUNTER — Inpatient Hospital Stay: Payer: 59 | Attending: Genetic Counselor

## 2017-11-22 ENCOUNTER — Inpatient Hospital Stay (HOSPITAL_BASED_OUTPATIENT_CLINIC_OR_DEPARTMENT_OTHER): Payer: 59 | Admitting: Adult Health

## 2017-11-22 ENCOUNTER — Encounter: Payer: Self-pay | Admitting: Adult Health

## 2017-11-22 ENCOUNTER — Telehealth: Payer: Self-pay | Admitting: Adult Health

## 2017-11-22 ENCOUNTER — Other Ambulatory Visit: Payer: Self-pay

## 2017-11-22 VITALS — BP 149/85 | HR 59 | Temp 98.8°F | Resp 18 | Ht 67.0 in | Wt 258.1 lb

## 2017-11-22 DIAGNOSIS — Z170 Estrogen receptor positive status [ER+]: Secondary | ICD-10-CM | POA: Insufficient documentation

## 2017-11-22 DIAGNOSIS — C50412 Malignant neoplasm of upper-outer quadrant of left female breast: Secondary | ICD-10-CM | POA: Insufficient documentation

## 2017-11-22 DIAGNOSIS — D649 Anemia, unspecified: Secondary | ICD-10-CM

## 2017-11-22 DIAGNOSIS — N951 Menopausal and female climacteric states: Secondary | ICD-10-CM | POA: Diagnosis not present

## 2017-11-22 LAB — CMP (CANCER CENTER ONLY)
ALT: 12 U/L (ref 0–44)
ANION GAP: 10 (ref 5–15)
AST: 15 U/L (ref 15–41)
Albumin: 3.6 g/dL (ref 3.5–5.0)
Alkaline Phosphatase: 22 U/L — ABNORMAL LOW (ref 38–126)
BUN: 11 mg/dL (ref 6–20)
CHLORIDE: 104 mmol/L (ref 98–111)
CO2: 26 mmol/L (ref 22–32)
Calcium: 8.9 mg/dL (ref 8.9–10.3)
Creatinine: 0.91 mg/dL (ref 0.44–1.00)
GFR, Est AFR Am: >60 mL/min (ref >60)
GFR, Estimated: >60 mL/min (ref >60)
Glucose, Bld: 95 mg/dL (ref 70–99)
Potassium: 4.1 mmol/L (ref 3.5–5.1)
SODIUM: 140 mmol/L (ref 135–145)
Total Bilirubin: 0.3 mg/dL (ref 0.3–1.2)
Total Protein: 6.8 g/dL (ref 6.5–8.1)

## 2017-11-22 LAB — CBC WITH DIFFERENTIAL (CANCER CENTER ONLY)
Basophils Absolute: 0 10*3/uL (ref 0.0–0.1)
Basophils Relative: 1 %
Eosinophils Absolute: 0.1 10*3/uL (ref 0.0–0.5)
Eosinophils Relative: 2 %
HEMATOCRIT: 32.6 % — AB (ref 34.8–46.6)
HEMOGLOBIN: 10.7 g/dL — AB (ref 11.6–15.9)
Lymphocytes Relative: 29 %
Lymphs Abs: 1.6 10*3/uL (ref 0.9–3.3)
MCH: 28.5 pg (ref 25.1–34.0)
MCHC: 33 g/dL (ref 31.5–36.0)
MCV: 86.6 fL (ref 79.5–101.0)
MONOS PCT: 6 %
Monocytes Absolute: 0.3 10*3/uL (ref 0.1–0.9)
NEUTROS ABS: 3.4 10*3/uL (ref 1.5–6.5)
Neutrophils Relative %: 62 %
Platelet Count: 241 10*3/uL (ref 145–400)
RBC: 3.77 MIL/uL (ref 3.70–5.45)
RDW: 13.9 % (ref 11.2–14.5)
WBC Count: 5.4 10*3/uL (ref 3.9–10.3)

## 2017-11-22 NOTE — Patient Instructions (Signed)
Bone Health Bones protect organs, store calcium, and anchor muscles. Good health habits, such as eating nutritious foods and exercising regularly, are important for maintaining healthy bones. They can also help to prevent a condition that causes bones to lose density and become weak and brittle (osteoporosis). Why is bone mass important? Bone mass refers to the amount of bone tissue that you have. The higher your bone mass, the stronger your bones. An important step toward having healthy bones throughout life is to have strong and dense bones during childhood. A young adult who has a high bone mass is more likely to have a high bone mass later in life. Bone mass at its greatest it is called peak bone mass. A large decline in bone mass occurs in older adults. In women, it occurs about the time of menopause. During this time, it is important to practice good health habits, because if more bone is lost than what is replaced, the bones will become less healthy and more likely to break (fracture). If you find that you have a low bone mass, you may be able to prevent osteoporosis or further bone loss by changing your diet and lifestyle. How can I find out if my bone mass is low? Bone mass can be measured with an X-ray test that is called a bone mineral density (BMD) test. This test is recommended for all women who are age 65 or older. It may also be recommended for men who are age 70 or older, or for people who are more likely to develop osteoporosis due to:  Having bones that break easily.  Having a long-term disease that weakens bones, such as kidney disease or rheumatoid arthritis.  Having menopause earlier than normal.  Taking medicine that weakens bones, such as steroids, thyroid hormones, or hormone treatment for breast cancer or prostate cancer.  Smoking.  Drinking three or more alcoholic drinks each day.  What are the nutritional recommendations for healthy bones? To have healthy bones, you  need to get enough of the right minerals and vitamins. Most nutrition experts recommend getting these nutrients from the foods that you eat. Nutritional recommendations vary from person to person. Ask your health care provider what is healthy for you. Here are some general guidelines. Calcium Recommendations Calcium is the most important (essential) mineral for bone health. Most people can get enough calcium from their diet, but supplements may be recommended for people who are at risk for osteoporosis. Good sources of calcium include:  Dairy products, such as low-fat or nonfat milk, cheese, and yogurt.  Dark green leafy vegetables, such as bok choy and broccoli.  Calcium-fortified foods, such as orange juice, cereal, bread, soy beverages, and tofu products.  Nuts, such as almonds.  Follow these recommended amounts for daily calcium intake:  Children, age 1?3: 700 mg.  Children, age 4?8: 1,000 mg.  Children, age 9?13: 1,300 mg.  Teens, age 14?18: 1,300 mg.  Adults, age 19?50: 1,000 mg.  Adults, age 51?70: ? Men: 1,000 mg. ? Women: 1,200 mg.  Adults, age 71 or older: 1,200 mg.  Pregnant and breastfeeding females: ? Teens: 1,300 mg. ? Adults: 1,000 mg.  Vitamin D Recommendations Vitamin D is the most essential vitamin for bone health. It helps the body to absorb calcium. Sunlight stimulates the skin to make vitamin D, so be sure to get enough sunlight. If you live in a cold climate or you do not get outside often, your health care provider may recommend that you take vitamin   D supplements. Good sources of vitamin D in your diet include:  Egg yolks.  Saltwater fish.  Milk and cereal fortified with vitamin D.  Follow these recommended amounts for daily vitamin D intake:  Children and teens, age 67?18: 69 international units.  Adults, age 57 or younger: 400-800 international units.  Adults, age 50 or older: 800-1,000 international units.  Other Nutrients Other nutrients  for bone health include:  Phosphorus. This mineral is found in meat, poultry, dairy foods, nuts, and legumes. The recommended daily intake for adult men and adult women is 700 mg.  Magnesium. This mineral is found in seeds, nuts, dark green vegetables, and legumes. The recommended daily intake for adult men is 400?420 mg. For adult women, it is 310?320 mg.  Vitamin K. This vitamin is found in green leafy vegetables. The recommended daily intake is 120 mg for adult men and 90 mg for adult women.  What type of physical activity is best for building and maintaining healthy bones? Weight-bearing and strength-building activities are important for building and maintaining peak bone mass. Weight-bearing activities cause muscles and bones to work against gravity. Strength-building activities increases muscle strength that supports bones. Weight-bearing and muscle-building activities include:  Walking and hiking.  Jogging and running.  Dancing.  Gym exercises.  Lifting weights.  Tennis and racquetball.  Climbing stairs.  Aerobics.  Adults should get at least 30 minutes of moderate physical activity on most days. Children should get at least 60 minutes of moderate physical activity on most days. Ask your health care provide what type of exercise is best for you. Where can I find more information? For more information, check out the following websites:  [place]: YardHomes.se  [employer] of Health: http://www.niams.AnonymousEar.fr.asp  This information is not intended to replace advice given to you by your health care provider. Make sure you discuss any questions you have with your health care provider. Document Released: 05/21/2003 Document Revised: 09/18/2015 Document Reviewed: 03/05/2014 Elsevier Interactive Patient Education  2018 [employer]. Anastrozole tablets What is this  medicine? ANASTROZOLE (an AS troe zole) is used to treat breast cancer in women who have gone through menopause. Some types of breast cancer depend on estrogen to grow, and this medicine can stop tumor growth by blocking estrogen production. This medicine may be used for other purposes; ask your health care provider or pharmacist if you have questions. COMMON BRAND NAME(S): Arimidex What should I tell my health care provider before I take this medicine? They need to know if you have any of these conditions: -liver disease -an unusual or allergic reaction to anastrozole, other medicines, foods, dyes, or preservatives -pregnant or trying to get pregnant -breast-feeding How should I use this medicine? Take this medicine by mouth with a glass of water. Follow the directions on the prescription label. You can take this medicine with or without food. Take your doses at regular intervals. Do not take your medicine more often than directed. Do not stop taking except on the advice of your doctor or health care professional. Talk to your pediatrician regarding the use of this medicine in children. Special care may be needed. Overdosage: If you think you have taken too much of this medicine contact a poison control center or emergency room at once. NOTE: This medicine is only for you. Do not share this medicine with others. What if I miss a dose? If you miss a dose, take it as soon as you can. If it is almost time for  your next dose, take only that dose. Do not take double or extra doses. What may interact with this medicine? Do not take this medicine with any of the following medications: -female hormones, like estrogens or progestins and birth control pills This medicine may also interact with the following medications: -tamoxifen This list may not describe all possible interactions. Give your health care provider a list of all the medicines, herbs, non-prescription drugs, or dietary supplements you  use. Also tell them if you smoke, drink alcohol, or use illegal drugs. Some items may interact with your medicine. What should I watch for while using this medicine? Visit your doctor or health care professional for regular checks on your progress. Let your doctor or health care professional know about any unusual vaginal bleeding. Do not treat yourself for diarrhea, nausea, vomiting or other side effects. Ask your doctor or health care professional for advice. What side effects may I notice from receiving this medicine? Side effects that you should report to your doctor or health care professional as soon as possible: -allergic reactions like skin rash, itching or hives, swelling of the face, lips, or tongue -any new or unusual symptoms -breathing problems -chest pain -leg pain or swelling -vomiting Side effects that usually do not require medical attention (report to your doctor or health care professional if they continue or are bothersome): -back or bone pain -cough, or throat infection -diarrhea or constipation -dizziness -headache -hot flashes -loss of appetite -nausea -sweating -weakness and tiredness -weight gain This list may not describe all possible side effects. Call your doctor for medical advice about side effects. You may report side effects to FDA at 1-800-FDA-1088. Where should I keep my medicine? Keep out of the reach of children. Store at room temperature between 20 and 25 degrees C (68 and 77 degrees F). Throw away any unused medicine after the expiration date. NOTE: This sheet is a summary. It may not cover all possible information. If you have questions about this medicine, talk to your doctor, pharmacist, or health care provider.  2018 Elsevier/Gold Standard (2007-05-11 16:31:52)

## 2017-11-22 NOTE — Telephone Encounter (Signed)
Per 9/11 no los °

## 2017-11-22 NOTE — Progress Notes (Signed)
ID: [name] [name]   DOB: 12/23/1965  MR#: [id number]  PYP#:[id number]  PCP: [name] Lass, MD GYN: SU: [name] [name] OTHER MD: [name] [name],   CHIEF COMPLAINT: left breast cancer   CURRENT TREATMENT: tamoxifen 43m daily  BREAST CANCER HISTORY: From the original intake note:  VCarolin"[name] [name] STamala [name] a 52year old [place] who underwent a yearly screening mammography in 02/11/2011. This showed heterogeneously dense tissue and a possible mass in the left breast. Additional views 03/21/2011 showed an area of amorphous calcifications in the upper central aspect of the left breast, measuring up to 3.0 cm. There was no associated mass. Biopsy was performed the same day and showed (([id number] invasive ductal breast cancer, grade 1-2, with ductal carcinoma in situ, grade 1-3. The invasive tumor was 57% estrogen receptor positive, 97% progesterone receptor positive, with an MIB-1 of 10% and no HER-2 amplification.  With this information the patient proceeded to bilateral breast MRIs 03/31/2011 this showed a round enhancing mass with irregular margins measuring 6 mm, with an associated biopsy clip. There was additional clumped and linear enhancement of compatible with DCIS the total area measuring 3.1 cm. There were no other suspicious masses in either breast and there was no axillary or internal mammary adenopathy noted.   Her subsequent history is as detailed below.  INTERVAL HISTORY:  [name] [name] here today for follow up of her estrogen positive breast cancer.  She continues on Tamoxifen daily.  She denies any issues currently.  She notes initially she had some increased emotional issues and was put on celexa and then it was increased a couple of years later.  This helps with hot flashes, and she is feeling well on this medication.   Since  Her last visit she underwent mammogram in 06/2017 breast density B.  She underwent colon cancer screening 2 years ago and was told to repeat in 10 years.  She  says her PCP does a quick skin check when she sees her.  [name] [name] anemic today with a hemoglobin of 10.7 however she notes she donated plasma last week.  She notes her hemoglobin was 13.9 at that time.  REVIEW OF SYSTEMS: She will see gyn next week, Dr. RMarvel Planand review her genetic testing results and determine necessity of TAH/BSO.  She has been about one year without a menstrual cycle, and no bleeding in between.  She understands the potential of changing to a different medication and wants to know if this is now the [name]. No recent bone density test.  She denies any new breast concerns.  She sees her PCP regularly.  She says she exercises in waves.  She says her arthritis in her knees has limited her movement.  She does require cortisone shots about every 6 months.  Otherwise she is doing well and a detailed ROs was non contributory.     PAST MEDICAL HISTORY: Past Medical History:  Diagnosis Date  . Anemia    not at present  . Arthritis    Osteoarthritis Neck and Back  . Breast cancer ([place]   . [facility]    Left breast cancer  . DJD (degenerative joint disease)    Cervical and lumbar spin  . Family history of uterine cancer   . Hx of seizure disorder    Remote hx  . Personal history of radiation therapy    2017 left breast  . S/P radiation therapy 06/13/2011 - 07/22/11   Left Breast/ 50 Gy in 25 Fractions: Left Breast Boost Boost/  10 Gy in 5 Fractions  . Seasonal allergies   . 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 ER+, PR+, Ki-67 10%, Her2- No Amplification  She had a right breast biopsy remotely, which was benign. She has mild degenerative disc disease involving the cervical and lumbar spines. She status post lap band surgery November of 2010, and has lost 92 pounds so far. She has a history of seasonal allergies, hypertension which is improving eyes she continues to lose weight,  history of postpartum migraines, resolved, history of seizure, remote, and is status post C-section x2 and dilatation and curettage times one   PAST SURGICAL HISTORY: Past Surgical History:  Procedure Laterality Date  . BIOPSY BREAST  08/07/09   Breast, Right Needle core Biopsy, UOQ - Pseudoangiomatous Stromal hyperplasia  . BREAST BIOPSY  03/21/11  . BREAST LUMPECTOMY  04/19/11   snbx  . BREAST LUMPECTOMY  05/10/11   Left Breast Lumpectomy re-excision  . BREAST REDUCTION SURGERY  02/15/2012   Procedure: MAMMARY REDUCTION  (BREAST);  Surgeon: [name] Kos, DO;  Location: [place];  Service: Plastics;  Laterality: Right;  RIGHT BREAST MASTOPEXY  . [place], 2005  . DILATION AND CURETTAGE OF UTERUS     X 1  . LAPAROSCOPIC GASTRIC BANDING  01/27/2009  . REDUCTION MAMMAPLASTY Right     FAMILY HISTORY Family History  Problem Relation Age of Onset  . Stroke Mother        Stroke - mini  . Cancer Father        Lymphoma - passed age 50  . Skin cancer Father   . Skin cancer Sister   . Uterine cancer Sister 80  The patient's father died at the age of 46 from sepsis in the setting of lymphoma. The patient's mother died at the age of 64 after a heart attack in the setting of multiple strokes. The patient had no brothers. She has 3 sisters. There is no history of breast or ovarian cancer in the family  GYNECOLOGIC HISTORY: Menarche age 41, first pregnancy to term age 41. She is GX P2. As noted above, she still having periods through tamoxifen, although they are slightly less frequent and briefer. 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 She divorced her first husband, with 20 and 7-year-old sons at home, with shared custody. She married [name] [name] in 2014. He works as a Medical illustrator. She has 2 daughters from a prior marriage, age 48 and 57. She attends [employer]  locally     ADVANCED DIRECTIVES: not in place  HEALTH MAINTENANCE: Social History   Tobacco Use  . Smoking status: Never Smoker  . Smokeless tobacco: Never Used  Substance Use Topics  . Alcohol use: Yes    Comment: rarely  . Drug use: No     Colonoscopy:  PAP:  Bone density:  Lipid panel:  Allergies  Allergen Reactions  . Prednisolone Hives    Current Outpatient Medications  Medication Sig Dispense Refill  . acetaminophen (TYLENOL) 325 MG tablet Take 650 mg by mouth every 6 (six) hours as needed.    . Biotin 5000 MCG CAPS Take 1 capsule by mouth every morning.    . cetirizine (ZYRTEC) 10 MG tablet Take 10 mg by mouth as needed.     . citalopram (CELEXA) 10  MG tablet Take 20 mg by mouth daily.    . ferrous sulfate 325 (65 FE) MG tablet Take 325 mg by mouth daily.     Marland Kitchen ibuprofen (ADVIL,MOTRIN) 200 MG tablet Take 200 mg by mouth every 6 (six) hours as needed. Reported on 07/21/2015    . lansoprazole (PREVACID) 30 MG capsule Take 30 mg by mouth daily at 12 noon.    . MULTIPLE VITAMIN PO Take 1 tablet by mouth.    . NON FORMULARY     . tamoxifen (NOLVADEX) 20 MG tablet TAKE 1 TABLET DAILY 90 tablet 3   No current facility-administered medications for this visit.     OBJECTIVE: Vitals:   11/22/17 0844  BP: (!) 149/85  Pulse: (!) 59  Resp: 18  Temp: 98.8 F (37.1 C)  SpO2: 100%     Body mass index is 40.42 kg/m.    ECOG FS: 0 Filed Weights   11/22/17 0844  Weight: 258 lb 1.6 oz (117.1 kg)   GENERAL: Patient is a well appearing female in no acute distress HEENT:  Sclerae anicteric.  Oropharynx clear and moist. No ulcerations or evidence of oropharyngeal candidiasis. Neck is supple.  NODES:  No cervical, supraclavicular, or axillary lymphadenopathy palpated.  BREAST EXAM:  Left breast s/p lumpectomy, no nodules or masses, right breast without nodules, masses, skin or nipple lesions.   LUNGS:  Clear to auscultation bilaterally.  No wheezes or rhonchi. HEART:   Regular rate and rhythm. No murmur appreciated. ABDOMEN:  Soft, nontender.  Positive, normoactive bowel sounds. No organomegaly palpated. MSK:  No focal spinal tenderness to palpation. Full range of motion bilaterally in the upper extremities. EXTREMITIES:  No peripheral edema.   SKIN:  Clear with no obvious rashes or skin changes. No nail dyscrasia. NEURO:  Nonfocal. Well oriented.  Appropriate affect.   LAB RESULTS:  Lab Results  Component Value Date   WBC 5.4 11/22/2017   NEUTROABS 3.4 11/22/2017   HGB 10.7 (L) 11/22/2017   HCT 32.6 (L) 11/22/2017   MCV 86.6 11/22/2017   PLT 241 11/22/2017      Chemistry      Component Value Date/Time   NA 141 11/22/2016 0854   K 4.0 11/22/2016 0854   CL 105 05/01/2012 1044   CO2 29 11/22/2016 0854   BUN 8.8 11/22/2016 0854   CREATININE 0.8 11/22/2016 0854      Component Value Date/Time   CALCIUM 9.4 11/22/2016 0854   ALKPHOS 17 (L) 11/22/2016 0854   AST 15 11/22/2016 0854   ALT 12 11/22/2016 0854   BILITOT 0.42 11/22/2016 0854       Lab Results  Component Value Date   LABCA2 23 04/05/2011    No components found for: [id number]  No results for input(s): INR in the last 168 hours.  Urinalysis No results found for: COLORURINE    STUDIES:     ASSESSMENT: 52 y.o.  [place] woman status post Left lumpectomy and sentinel lymph node sampling 04/19/2011 for a T2 N0, stage IIA invasive ductal carcinoma, grade 1, estrogen and progesterone receptor positive at 57% and 97% respectively, HER-2 negative, with an MIB-1 of 10.  (1) Her Oncotype Dx predicts a distant recurrence rate of 13% if her only systemic treatment is tamoxifen for 5 years.  (2) completed radiation 07/22/2011  (3) started tamoxifen May 2013--[name] is for 10 years of antiestrogen therapy  (4) genetics testing offered to patient and rediscussed September 2016: Patient opts against it  (a) tested 01/06/17.  Genetic testing was negative and no deleterious mutations  were identified.  Genetic testing did detect a Variant of Unknown Significance in the BRCA1 gene called c.343C>A (p.Pro115Thr). Genes tested include:  ALK, APC, ATM, AXIN2,BAP1,  BARD1, BLM, BMPR1A, BRCA1, BRCA2, BRIP1, CASR, CDC73, CDH1, CDK4, CDKN1B, CDKN1C, CDKN2A (p14ARF), CDKN2A (p16INK4a), CEBPA, CHEK2, CTNNA1, DICER1, DIS3L2, EGFR (c.2369C>T, p.Thr790Met variant only), EPCAM (Deletion/duplication testing only), FH, FLCN, GATA2, GPC3, GREM1 (Promoter region deletion/duplication testing only), HOXB13 (c.251G>A, p.Gly84Glu), HRAS, KIT, MAX, MEN1, MET, MITF (c.952G>A, p.Glu318Lys variant only), MLH1, MSH2, MSH3, MSH6, MUTYH, NBN, NF1, NF2, NTHL1, PALB2, PDGFRA, PHOX2B, PMS2, POLD1, POLE, POT1, PRKAR1A, PTCH1, PTEN, RAD50, RAD51C, RAD51D, RB1, RECQL4, RET, RUNX1, SDHAF2, SDHA (sequence changes only), SDHB, SDHC, SDHD, SMAD4, SMARCA4, SMARCB1, SMARCE1, STK11, SUFU, TERT, TERT, TMEM127, TP53, TSC1, TSC2, VHL, WRN and WT1.   [name]:   [name] [name] is doing well today.  She is clinically and radiographically without any sign of recurrence.  I reviewed her case with Dr. Jana Hakim and discussed recommendations with her over the phone.  She will continue on Tamoxifen for now.  She will see her GYN next week and we recommend and request she has [place] and estradiol tested at that time.  Once we confirm she is indeed post menopausal she can start Anastrozole and see [place] a few months later to determine she is tolerating it well.  I went ahead and gave her detailed information about it in her AVS.  I also reviewed bone health recommendations with her.  We reviewed that she will need bone density testing once she switches to anastrozole.    We reviewed her surgical recommendations.  She has no family history of ovarian cancer.  She has decided against TAH-BSO.  We did review her genetic testing results and her VUS in BRCA gene.  There is no data to recommend for or against TAH-BSO.    Once we get her lab results from the [facility] [place], I  will send in anastrozole.  At that time we will have her return in a few months for f/u with Dr. Jana Hakim.  She knows to call for any problems that may develop before the next visit here.  A total of (30) minutes of face-to-face time was spent with this patient with greater than 50% of that time in counseling and care-coordination.   Scot Dock    11/22/2017

## 2017-11-23 ENCOUNTER — Telehealth: Payer: Self-pay | Admitting: Oncology

## 2017-11-23 NOTE — Telephone Encounter (Signed)
Spoke to pt regarding upcoming appts per 9/11 sch message  °

## 2018-03-05 NOTE — Patient Instructions (Addendum)
Your procedure is scheduled on: Tuesday, Jan 7  Enter through the [employer] [facility] at: 6 am  Pick up the phone at the desk and dial [phone].  Call this number if you have problems the morning of surgery: [phone].  Remember: Do NOT eat food or Do NOT drink clear liquids (including water) after midnight Monday.  Take these medicines the morning of surgery with a SIP OF WATER: zyrtec, celexa, prevacid and tamoxifen.  Ok to use eye drops if needed.  Brush your teeth on the day of surgery.  Stop herbal medications, vitamin supplements, Ibuprofen/NSAIDS at this time.  Do NOT wear jewelry (body piercing), metal hair clips/bobby pins, make-up, or nail polish. Do NOT wear lotions, powders, or perfumes.  You may wear deoderant. Do NOT shave for 48 hours prior to surgery. Do NOT bring valuables to the hospital.  Leave suitcase in car.  After surgery it may be brought to your room.  For patients admitted to the hospital, checkout time is 11:00 AM the day of discharge. Have a responsible adult drive you home and stay with you for 24 hours after your procedure. Home with [place] "Lanny Hurst"  cell [phone]

## 2018-03-13 ENCOUNTER — Other Ambulatory Visit: Payer: Self-pay | Admitting: Oncology

## 2018-03-13 ENCOUNTER — Telehealth: Payer: Self-pay | Admitting: *Deleted

## 2018-03-13 NOTE — Telephone Encounter (Signed)
[name] [name] states she is having a total hysterectomy on 1/7/202. Wants to know if she needs to stop taking tamoxifen or continue until she sees Dr Jana Hakim on 04/23/18.

## 2018-03-13 NOTE — Telephone Encounter (Signed)
LM to stop tamoxifen until she sees Dr Jana Hakim

## 2018-03-15 ENCOUNTER — Encounter (HOSPITAL_COMMUNITY)
Admission: RE | Admit: 2018-03-15 | Discharge: 2018-03-15 | Disposition: A | Discharge status: Home / Self Care | Payer: 59 | Source: Ambulatory Visit | Attending: Obstetrics and Gynecology | Admitting: Obstetrics and Gynecology

## 2018-03-15 ENCOUNTER — Encounter (HOSPITAL_COMMUNITY): Payer: Self-pay

## 2018-03-15 ENCOUNTER — Other Ambulatory Visit: Payer: Self-pay

## 2018-03-15 DIAGNOSIS — Z01812 Encounter for preprocedural laboratory examination: Secondary | ICD-10-CM | POA: Insufficient documentation

## 2018-03-15 HISTORY — DX: Major depressive disorder, single episode, unspecified: F32.9

## 2018-03-15 HISTORY — DX: Gastro-esophageal reflux disease without esophagitis: K21.9

## 2018-03-15 HISTORY — DX: Depression, unspecified: F32.A

## 2018-03-15 LAB — TYPE AND SCREEN
ABO/RH(D): A NEG
Antibody Screen: NEGATIVE

## 2018-03-15 LAB — COMPREHENSIVE METABOLIC PANEL
ALBUMIN: 3.9 g/dL (ref 3.5–5.0)
ALT: 18 U/L (ref 0–44)
AST: 21 U/L (ref 15–41)
Alkaline Phosphatase: 15 U/L — ABNORMAL LOW (ref 38–126)
Anion gap: 8 (ref 5–15)
BUN: 10 mg/dL (ref 6–20)
CHLORIDE: 102 mmol/L (ref 98–111)
CO2: 26 mmol/L (ref 22–32)
Calcium: 8.5 mg/dL — ABNORMAL LOW (ref 8.9–10.3)
Creatinine, Ser: 0.86 mg/dL (ref 0.44–1.00)
GFR calc Af Amer: >60 mL/min (ref >60)
GFR calc non Af Amer: >60 mL/min (ref >60)
GLUCOSE: 104 mg/dL — AB (ref 70–99)
Potassium: 3.7 mmol/L (ref 3.5–5.1)
Sodium: 136 mmol/L (ref 135–145)
Total Bilirubin: 0.5 mg/dL (ref 0.3–1.2)
Total Protein: 7.1 g/dL (ref 6.5–8.1)

## 2018-03-15 LAB — CBC
HCT: 34.3 % — ABNORMAL LOW (ref 36.0–46.0)
Hemoglobin: 11.2 g/dL — ABNORMAL LOW (ref 12.0–15.0)
MCH: 28.6 pg (ref 26.0–34.0)
MCHC: 32.7 g/dL (ref 30.0–36.0)
MCV: 87.5 fL (ref 80.0–100.0)
NRBC: 0 % (ref 0.0–0.2)
PLATELETS: 273 10*3/uL (ref 150–400)
RBC: 3.92 MIL/uL (ref 3.87–5.11)
RDW: 13.6 % (ref 11.5–15.5)
WBC: 5.8 10*3/uL (ref 4.0–10.5)

## 2018-03-15 LAB — ABO/RH: ABO/RH(D): A NEG

## 2018-03-19 NOTE — H&P (Signed)
[name] [name] is an 53 y.o. female [id number] who presents for scheduled hysterectomy/BSO due to several issues.  Pt has a history of invasive breast cancer ER positive in 2013 s/p lumpectomy/chemo and radiation.  She had one year of no bleeding, but then abnormal bleeding in 9/19 that prompted an [place] and was found to have a thickened lining and right adnexal cystic masses noted (5cm x 2)  EMB was benign but significant for probable polyps.  Since she remains on oral chemotherapy and given all issues, she has elected definitive treatment that will address all issues with hysterectomy BSO.  Pertinent Gynecological History: OB History: C/S x 2   Menstrual History:  Patient's last menstrual period was 03/11/2018 (exact date).    Past Medical History:  Diagnosis Date  . Anemia    not at present  . Arthritis    Osteoarthritis Neck and Back  . Breast cancer ([place])   . Cancer [facility]) 2013   Left breast cancer  . Depression   . DJD (degenerative joint disease)    Cervical and lumbar spin  . Family history of uterine cancer    sister  . GERD (gastroesophageal reflux disease)   . Hx of seizure disorder    Patient denies this dx - pt states "never had a seizure"  . Hypertension    Hx prior to lap band surgery 10 yrs ago, lost weight, no BP issues since surgery, no medication  . Personal history of radiation therapy    2017 left breast  . S/P radiation therapy 06/13/2011 - 07/22/11   Left Breast/ 50 Gy in 25 Fractions: Left Breast Boost Boost/ 10 Gy in 5 Fractions  . Seasonal allergies   . 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 ER+, PR+, Ki-67 10%, Her2- No Amplification    Past Surgical History:  Procedure Laterality Date  . BIOPSY BREAST  08/07/09   Breast, Right Needle core Biopsy, UOQ - Pseudoangiomatous Stromal hyperplasia  . BREAST BIOPSY  03/21/11  . BREAST LUMPECTOMY  04/19/11   snbx   . BREAST LUMPECTOMY  05/10/11   Left Breast Lumpectomy re-excision  . BREAST REDUCTION SURGERY  02/15/2012   Procedure: MAMMARY REDUCTION  (BREAST);  Surgeon: Theodoro Kos, DO;  Location: [place];  Service: Plastics;  Laterality: Right;  RIGHT BREAST MASTOPEXY  . [place], 2005   x 2  . COLONOSCOPY    . DILATION AND CURETTAGE OF UTERUS     X 1  . LAPAROSCOPIC GASTRIC BANDING  01/27/2009  . REDUCTION MAMMAPLASTY Right   . TUBAL LIGATION      Family History  Problem Relation Age of Onset  . Stroke Mother        Stroke - mini  . Cancer Father        Lymphoma - passed age 61  . Skin cancer Father   . Skin cancer Sister   . Uterine cancer Sister 31    Social History:  reports that she has never smoked. She has never used smokeless tobacco. She reports current alcohol use of about 2.0 - 3.0 standard drinks of alcohol per week. She reports that she does not use drugs.  Allergies:  Allergies  Allergen Reactions  . Prednisolone Hives    No medications prior to admission.    Review of Systems  Gastrointestinal: Negative for abdominal pain and heartburn.  Neurological: Negative for  headaches.    Last menstrual period 03/11/2018. Physical Exam  Constitutional: She appears well-developed.  Cardiovascular: Normal rate and regular rhythm.  Respiratory: Effort normal.  GI: Soft.  Pfannenstiel and laparoscopic incisions  Genitourinary:    Uterus normal.     Genitourinary Comments: Uterus enlarged 9 weeks size Cervix with poor descensus   Neurological: She is alert.  Psychiatric: She has a normal mood and affect.    No results found for this or any previous visit (from the past 24 hour(s)).  No results found.  Assessment/Plan: Pt was counseled on the risks and benfits of TLH/BSO including bleeding, infection, and possible damage to bowel and bladder or ureters. We discussed that given the size of the ovarian cysts, her h/o lap band and  c-sections  and the fibroids that an open approach may be necesssary to complete the surgery and she is fine with that. We will do cystoscopy at the end of the procedure. She would accept a transfusion if needed. We reviewed the procedure in detail including laparoscopic vs vaginal closure of cuff. We discussed a possible incision in the event of any complication and possible delayed recovery from that. She is ready to proceed.  Logan Bores 03/19/2018, 12:56 PM

## 2018-03-20 ENCOUNTER — Encounter (HOSPITAL_COMMUNITY): Payer: Self-pay | Admitting: Emergency Medicine

## 2018-03-20 ENCOUNTER — Encounter (HOSPITAL_COMMUNITY)
Admission: RE | Disposition: A | Discharge status: Home / Self Care | Payer: Self-pay | Source: Home / Self Care | Attending: Obstetrics and Gynecology

## 2018-03-20 ENCOUNTER — Observation Stay (HOSPITAL_COMMUNITY): Payer: 59 | Admitting: Certified Registered Nurse Anesthetist

## 2018-03-20 ENCOUNTER — Ambulatory Visit (HOSPITAL_COMMUNITY)
Admission: RE | Admit: 2018-03-20 | Discharge: 2018-03-21 | Disposition: A | Discharge status: Home / Self Care | Payer: 59 | Attending: Obstetrics and Gynecology | Admitting: Obstetrics and Gynecology

## 2018-03-20 ENCOUNTER — Other Ambulatory Visit: Payer: Self-pay

## 2018-03-20 ENCOUNTER — Ambulatory Visit: Payer: Self-pay | Admitting: Oncology

## 2018-03-20 DIAGNOSIS — M47812 Spondylosis without myelopathy or radiculopathy, cervical region: Secondary | ICD-10-CM | POA: Insufficient documentation

## 2018-03-20 DIAGNOSIS — Z791 Long term (current) use of non-steroidal anti-inflammatories (NSAID): Secondary | ICD-10-CM | POA: Diagnosis not present

## 2018-03-20 DIAGNOSIS — M47816 Spondylosis without myelopathy or radiculopathy, lumbar region: Secondary | ICD-10-CM | POA: Diagnosis not present

## 2018-03-20 DIAGNOSIS — N83201 Unspecified ovarian cyst, right side: Secondary | ICD-10-CM | POA: Insufficient documentation

## 2018-03-20 DIAGNOSIS — F329 Major depressive disorder, single episode, unspecified: Secondary | ICD-10-CM | POA: Insufficient documentation

## 2018-03-20 DIAGNOSIS — Z923 Personal history of irradiation: Secondary | ICD-10-CM | POA: Diagnosis not present

## 2018-03-20 DIAGNOSIS — Z90721 Acquired absence of ovaries, unilateral: Secondary | ICD-10-CM

## 2018-03-20 DIAGNOSIS — Z8049 Family history of malignant neoplasm of other genital organs: Secondary | ICD-10-CM | POA: Diagnosis not present

## 2018-03-20 DIAGNOSIS — N840 Polyp of corpus uteri: Secondary | ICD-10-CM | POA: Insufficient documentation

## 2018-03-20 DIAGNOSIS — D270 Benign neoplasm of right ovary: Secondary | ICD-10-CM | POA: Diagnosis not present

## 2018-03-20 DIAGNOSIS — Z853 Personal history of malignant neoplasm of breast: Secondary | ICD-10-CM | POA: Insufficient documentation

## 2018-03-20 DIAGNOSIS — N736 Female pelvic peritoneal adhesions (postinfective): Secondary | ICD-10-CM | POA: Insufficient documentation

## 2018-03-20 DIAGNOSIS — N838 Other noninflammatory disorders of ovary, fallopian tube and broad ligament: Secondary | ICD-10-CM | POA: Diagnosis not present

## 2018-03-20 DIAGNOSIS — D259 Leiomyoma of uterus, unspecified: Secondary | ICD-10-CM | POA: Diagnosis not present

## 2018-03-20 DIAGNOSIS — Z170 Estrogen receptor positive status [ER+]: Secondary | ICD-10-CM | POA: Insufficient documentation

## 2018-03-20 DIAGNOSIS — R9389 Abnormal findings on diagnostic imaging of other specified body structures: Secondary | ICD-10-CM | POA: Diagnosis present

## 2018-03-20 DIAGNOSIS — Z9884 Bariatric surgery status: Secondary | ICD-10-CM | POA: Diagnosis not present

## 2018-03-20 DIAGNOSIS — G40909 Epilepsy, unspecified, not intractable, without status epilepticus: Secondary | ICD-10-CM | POA: Insufficient documentation

## 2018-03-20 DIAGNOSIS — D271 Benign neoplasm of left ovary: Secondary | ICD-10-CM | POA: Insufficient documentation

## 2018-03-20 DIAGNOSIS — Z79810 Long term (current) use of selective estrogen receptor modulators (SERMs): Secondary | ICD-10-CM | POA: Insufficient documentation

## 2018-03-20 DIAGNOSIS — Z807 Family history of other malignant neoplasms of lymphoid, hematopoietic and related tissues: Secondary | ICD-10-CM | POA: Insufficient documentation

## 2018-03-20 DIAGNOSIS — M479 Spondylosis, unspecified: Secondary | ICD-10-CM | POA: Insufficient documentation

## 2018-03-20 DIAGNOSIS — D649 Anemia, unspecified: Secondary | ICD-10-CM | POA: Insufficient documentation

## 2018-03-20 DIAGNOSIS — Z823 Family history of stroke: Secondary | ICD-10-CM | POA: Diagnosis not present

## 2018-03-20 DIAGNOSIS — K219 Gastro-esophageal reflux disease without esophagitis: Secondary | ICD-10-CM | POA: Diagnosis not present

## 2018-03-20 DIAGNOSIS — I10 Essential (primary) hypertension: Secondary | ICD-10-CM | POA: Diagnosis not present

## 2018-03-20 DIAGNOSIS — Z6841 Body Mass Index (BMI) 40.0 and over, adult: Secondary | ICD-10-CM | POA: Insufficient documentation

## 2018-03-20 DIAGNOSIS — Z808 Family history of malignant neoplasm of other organs or systems: Secondary | ICD-10-CM | POA: Insufficient documentation

## 2018-03-20 DIAGNOSIS — Z888 Allergy status to other drugs, medicaments and biological substances status: Secondary | ICD-10-CM | POA: Insufficient documentation

## 2018-03-20 DIAGNOSIS — Z90710 Acquired absence of both cervix and uterus: Secondary | ICD-10-CM | POA: Diagnosis present

## 2018-03-20 DIAGNOSIS — N83202 Unspecified ovarian cyst, left side: Secondary | ICD-10-CM | POA: Diagnosis not present

## 2018-03-20 HISTORY — PX: CYSTOSCOPY: SHX5120

## 2018-03-20 SURGERY — HYSTERECTOMY, TOTAL, LAPAROSCOPIC, WITH BILATERAL SALPINGO-OOPHORECTOMY
Anesthesia: General | Site: Bladder

## 2018-03-20 MED ORDER — EPHEDRINE 5 MG/ML INJ
INTRAVENOUS | Status: AC
  Filled 2018-03-20: qty 10

## 2018-03-20 MED ORDER — EPHEDRINE SULFATE 50 MG/ML IJ SOLN
INTRAMUSCULAR | Status: DC | PRN
  Administered 2018-03-20 (×2): 10 mg via INTRAVENOUS

## 2018-03-20 MED ORDER — IBUPROFEN 800 MG PO TABS
800.0000 mg | ORAL_TABLET | Freq: Three times a day (TID) | ORAL | Status: DC
  Administered 2018-03-20 – 2018-03-21 (×2): 800 mg via ORAL
  Filled 2018-03-20 (×2): qty 1

## 2018-03-20 MED ORDER — SUGAMMADEX SODIUM 500 MG/5ML IV SOLN
INTRAVENOUS | Status: AC
  Filled 2018-03-20: qty 5

## 2018-03-20 MED ORDER — GABAPENTIN 300 MG PO CAPS
300.0000 mg | ORAL_CAPSULE | Freq: Once | ORAL | Status: AC
  Administered 2018-03-20: 300 mg via ORAL

## 2018-03-20 MED ORDER — SUFENTANIL CITRATE 50 MCG/ML IV SOLN
INTRAVENOUS | Status: DC | PRN
  Administered 2018-03-20 (×3): 10 ug via INTRAVENOUS

## 2018-03-20 MED ORDER — KETOROLAC TROMETHAMINE 30 MG/ML IJ SOLN
INTRAMUSCULAR | Status: AC
  Filled 2018-03-20: qty 1

## 2018-03-20 MED ORDER — FLUORESCEIN SODIUM 10 % IV SOLN
INTRAVENOUS | Status: DC | PRN
  Administered 2018-03-20: 1 mL via INTRAVENOUS

## 2018-03-20 MED ORDER — LACTATED RINGERS IV SOLN
INTRAVENOUS | Status: DC
  Administered 2018-03-20 (×3): via INTRAVENOUS

## 2018-03-20 MED ORDER — HYDROMORPHONE HCL 1 MG/ML IJ SOLN
INTRAMUSCULAR | Status: DC | PRN
  Administered 2018-03-20 (×2): 1 mg via INTRAVENOUS

## 2018-03-20 MED ORDER — SODIUM CHLORIDE (PF) 0.9 % IJ SOLN
INTRAMUSCULAR | Status: AC
  Filled 2018-03-20: qty 10

## 2018-03-20 MED ORDER — ALBUMIN HUMAN 5 % IV SOLN
INTRAVENOUS | Status: AC
  Filled 2018-03-20: qty 250

## 2018-03-20 MED ORDER — BUPIVACAINE HCL (PF) 0.25 % IJ SOLN
INTRAMUSCULAR | Status: DC | PRN
  Administered 2018-03-20: 15 mL

## 2018-03-20 MED ORDER — MIDAZOLAM HCL 2 MG/2ML IJ SOLN
INTRAMUSCULAR | Status: AC
  Filled 2018-03-20: qty 2

## 2018-03-20 MED ORDER — FLAVOXATE HCL 100 MG PO TABS
100.0000 mg | ORAL_TABLET | Freq: Three times a day (TID) | ORAL | Status: DC | PRN
  Administered 2018-03-20: 100 mg via ORAL
  Filled 2018-03-20 (×2): qty 1

## 2018-03-20 MED ORDER — DOCUSATE SODIUM 100 MG PO CAPS
100.0000 mg | ORAL_CAPSULE | Freq: Two times a day (BID) | ORAL | Status: DC
  Administered 2018-03-20: 100 mg via ORAL
  Filled 2018-03-20: qty 1

## 2018-03-20 MED ORDER — MAGNESIUM HYDROXIDE 400 MG/5ML PO SUSP: 30.0000 mL | Freq: Every day | ORAL | Status: DC | PRN

## 2018-03-20 MED ORDER — ONDANSETRON HCL 4 MG PO TABS: 4.0000 mg | ORAL_TABLET | Freq: Four times a day (QID) | ORAL | Status: DC | PRN

## 2018-03-20 MED ORDER — CEFAZOLIN SODIUM-DEXTROSE 2-4 GM/100ML-% IV SOLN
2.0000 g | INTRAVENOUS | Status: AC
  Administered 2018-03-20: 2 g via INTRAVENOUS

## 2018-03-20 MED ORDER — BUPIVACAINE HCL (PF) 0.25 % IJ SOLN
INTRAMUSCULAR | Status: AC
  Filled 2018-03-20: qty 30

## 2018-03-20 MED ORDER — ALBUMIN HUMAN 5 % IV SOLN
INTRAVENOUS | Status: DC | PRN
  Administered 2018-03-20: 10:00:00 via INTRAVENOUS

## 2018-03-20 MED ORDER — MENTHOL 3 MG MT LOZG: 1.0000 | LOZENGE | OROMUCOSAL | Status: DC | PRN

## 2018-03-20 MED ORDER — OXYCODONE HCL 5 MG PO TABS
5.0000 mg | ORAL_TABLET | ORAL | Status: DC | PRN
  Administered 2018-03-21: 5 mg via ORAL
  Filled 2018-03-20: qty 1

## 2018-03-20 MED ORDER — ONDANSETRON HCL 4 MG/2ML IJ SOLN
INTRAMUSCULAR | Status: AC
  Filled 2018-03-20: qty 2

## 2018-03-20 MED ORDER — ONDANSETRON HCL 4 MG/2ML IJ SOLN: 4.0000 mg | Freq: Four times a day (QID) | INTRAMUSCULAR | Status: DC | PRN

## 2018-03-20 MED ORDER — SIMETHICONE 80 MG PO CHEW: 80.0000 mg | CHEWABLE_TABLET | Freq: Four times a day (QID) | ORAL | Status: DC | PRN

## 2018-03-20 MED ORDER — CEFAZOLIN SODIUM-DEXTROSE 2-4 GM/100ML-% IV SOLN
INTRAVENOUS | Status: AC
  Filled 2018-03-20: qty 100

## 2018-03-20 MED ORDER — GABAPENTIN 300 MG PO CAPS
ORAL_CAPSULE | ORAL | Status: AC
  Administered 2018-03-20: 300 mg via ORAL
  Filled 2018-03-20: qty 1

## 2018-03-20 MED ORDER — KETOROLAC TROMETHAMINE 30 MG/ML IJ SOLN
INTRAMUSCULAR | Status: DC | PRN
  Administered 2018-03-20: 30 mg via INTRAVENOUS

## 2018-03-20 MED ORDER — PROPOFOL 10 MG/ML IV BOLUS
INTRAVENOUS | Status: AC
  Filled 2018-03-20: qty 20

## 2018-03-20 MED ORDER — HYDROMORPHONE HCL 1 MG/ML IJ SOLN
INTRAMUSCULAR | Status: AC
  Filled 2018-03-20: qty 1

## 2018-03-20 MED ORDER — SCOPOLAMINE 1 MG/3DAYS TD PT72
1.0000 | MEDICATED_PATCH | Freq: Once | TRANSDERMAL | Status: DC
  Administered 2018-03-20: 1.5 mg via TRANSDERMAL

## 2018-03-20 MED ORDER — LACTATED RINGERS IV SOLN
INTRAVENOUS | Status: DC
  Administered 2018-03-20 – 2018-03-21 (×2): via INTRAVENOUS

## 2018-03-20 MED ORDER — LIDOCAINE HCL (CARDIAC) PF 100 MG/5ML IV SOSY
PREFILLED_SYRINGE | INTRAVENOUS | Status: AC
  Filled 2018-03-20: qty 5

## 2018-03-20 MED ORDER — GLYCOPYRROLATE 0.2 MG/ML IJ SOLN
INTRAMUSCULAR | Status: DC | PRN
  Administered 2018-03-20 (×2): 0.1 mg via INTRAVENOUS

## 2018-03-20 MED ORDER — ROCURONIUM BROMIDE 100 MG/10ML IV SOLN
INTRAVENOUS | Status: AC
  Filled 2018-03-20: qty 1

## 2018-03-20 MED ORDER — ACETAMINOPHEN 500 MG PO TABS
ORAL_TABLET | ORAL | Status: AC
  Administered 2018-03-20: 1000 mg via ORAL
  Filled 2018-03-20: qty 2

## 2018-03-20 MED ORDER — FENTANYL CITRATE (PF) 100 MCG/2ML IJ SOLN
25.0000 ug | INTRAMUSCULAR | Status: DC | PRN
  Administered 2018-03-20 (×5): 25 ug via INTRAVENOUS

## 2018-03-20 MED ORDER — FLUORESCEIN SODIUM 10 % IV SOLN
INTRAVENOUS | Status: AC
  Filled 2018-03-20: qty 5

## 2018-03-20 MED ORDER — PROMETHAZINE HCL 25 MG/ML IJ SOLN: 6.2500 mg | INTRAMUSCULAR | Status: DC | PRN

## 2018-03-20 MED ORDER — MIDAZOLAM HCL 2 MG/2ML IJ SOLN
INTRAMUSCULAR | Status: DC | PRN
  Administered 2018-03-20: 1.5 mg via INTRAVENOUS
  Administered 2018-03-20: 0.5 mg via INTRAVENOUS

## 2018-03-20 MED ORDER — ROCURONIUM BROMIDE 100 MG/10ML IV SOLN
INTRAVENOUS | Status: DC | PRN
  Administered 2018-03-20: 10 mg via INTRAVENOUS
  Administered 2018-03-20 (×3): 20 mg via INTRAVENOUS
  Administered 2018-03-20: 50 mg via INTRAVENOUS

## 2018-03-20 MED ORDER — SODIUM CHLORIDE 0.9 % IR SOLN
Status: DC | PRN
  Administered 2018-03-20: 3000 mL

## 2018-03-20 MED ORDER — SUGAMMADEX SODIUM 500 MG/5ML IV SOLN
INTRAVENOUS | Status: DC | PRN
  Administered 2018-03-20: 400 mg via INTRAVENOUS

## 2018-03-20 MED ORDER — GLYCOPYRROLATE 0.2 MG/ML IJ SOLN
INTRAMUSCULAR | Status: AC
  Filled 2018-03-20: qty 1

## 2018-03-20 MED ORDER — PANTOPRAZOLE SODIUM 40 MG PO TBEC: 40.0000 mg | DELAYED_RELEASE_TABLET | Freq: Every day | ORAL | Status: DC

## 2018-03-20 MED ORDER — ONDANSETRON HCL 4 MG/2ML IJ SOLN
INTRAMUSCULAR | Status: DC | PRN
  Administered 2018-03-20 (×2): 2 mg via INTRAVENOUS

## 2018-03-20 MED ORDER — FENTANYL CITRATE (PF) 100 MCG/2ML IJ SOLN
INTRAMUSCULAR | Status: AC
  Administered 2018-03-20: 25 ug via INTRAVENOUS
  Filled 2018-03-20: qty 2

## 2018-03-20 MED ORDER — PROPOFOL 10 MG/ML IV BOLUS
INTRAVENOUS | Status: DC | PRN
  Administered 2018-03-20: 150 mg via INTRAVENOUS

## 2018-03-20 MED ORDER — FENTANYL CITRATE (PF) 100 MCG/2ML IJ SOLN
INTRAMUSCULAR | Status: AC
  Filled 2018-03-20: qty 2

## 2018-03-20 MED ORDER — SUFENTANIL CITRATE 50 MCG/ML IV SOLN
INTRAVENOUS | Status: AC
  Filled 2018-03-20: qty 1

## 2018-03-20 MED ORDER — LIDOCAINE HCL (CARDIAC) PF 100 MG/5ML IV SOSY
PREFILLED_SYRINGE | INTRAVENOUS | Status: DC | PRN
  Administered 2018-03-20: 100 mg via INTRAVENOUS

## 2018-03-20 MED ORDER — SCOPOLAMINE 1 MG/3DAYS TD PT72
MEDICATED_PATCH | TRANSDERMAL | Status: AC
  Administered 2018-03-20: 1.5 mg via TRANSDERMAL
  Filled 2018-03-20: qty 1

## 2018-03-20 MED ORDER — HYDROMORPHONE HCL 1 MG/ML IJ SOLN
0.2000 mg | INTRAMUSCULAR | Status: DC | PRN
  Administered 2018-03-20 (×2): 0.6 mg via INTRAVENOUS
  Filled 2018-03-20 (×2): qty 1

## 2018-03-20 MED ORDER — BISACODYL 10 MG RE SUPP: 10.0000 mg | Freq: Every day | RECTAL | Status: DC | PRN

## 2018-03-20 MED ORDER — ACETAMINOPHEN 500 MG PO TABS
1000.0000 mg | ORAL_TABLET | Freq: Once | ORAL | Status: AC
  Administered 2018-03-20: 1000 mg via ORAL

## 2018-03-20 SURGICAL SUPPLY — 61 items
ADH SKN CLS APL DERMABOND .7 (GAUZE/BANDAGES/DRESSINGS) ×2
APL SKNCLS STERI-STRIP NONHPOA (GAUZE/BANDAGES/DRESSINGS) ×2
BARRIER ADHS 3X4 INTERCEED (GAUZE/BANDAGES/DRESSINGS) ×4 IMPLANT
BENZOIN TINCTURE PRP APPL 2/3 (GAUZE/BANDAGES/DRESSINGS) ×2 IMPLANT
BLADE SURG 10 STRL SS (BLADE) ×2 IMPLANT
BRR ADH 4X3 ABS CNTRL BYND (GAUZE/BANDAGES/DRESSINGS) ×2
CABLE HIGH FREQUENCY MONO STRZ (ELECTRODE) IMPLANT
CLOSURE WOUND 1/2 X4 (GAUZE/BANDAGES/DRESSINGS) ×2
DERMABOND ADVANCED (GAUZE/BANDAGES/DRESSINGS) ×2
DERMABOND ADVANCED .7 DNX12 (GAUZE/BANDAGES/DRESSINGS) ×2 IMPLANT
DEVICE SUTURE ENDOST 10MM (ENDOMECHANICALS) ×4 IMPLANT
DRSG OPSITE POSTOP 3X4 (GAUZE/BANDAGES/DRESSINGS) ×2 IMPLANT
DRSG OPSITE POSTOP 4X10 (GAUZE/BANDAGES/DRESSINGS) ×2 IMPLANT
DURAPREP 26ML APPLICATOR (WOUND CARE) ×4 IMPLANT
FILTER SMOKE EVAC LAPAROSHD (FILTER) ×4 IMPLANT
GLOVE BIO SURGEON STRL SZ 6.5 (GLOVE) ×6 IMPLANT
GLOVE BIO SURGEONS STRL SZ 6.5 (GLOVE) ×2
GLOVE BIOGEL PI IND STRL 7.0 (GLOVE) ×4 IMPLANT
GLOVE BIOGEL PI INDICATOR 7.0 (GLOVE) ×4
GOWN STRL REUS W/TWL LRG LVL3 (GOWN DISPOSABLE) ×12 IMPLANT
HIBICLENS CHG 4% 4OZ BTL (MISCELLANEOUS) ×4 IMPLANT
NDL INSUFFLATION 14GA 120MM (NEEDLE) ×2 IMPLANT
NEEDLE INSUFFLATION 14GA 120MM (NEEDLE) ×4 IMPLANT
NS IRRIG 1000ML POUR BTL (IV SOLUTION) ×4 IMPLANT
OCCLUDER COLPOPNEUMO (BALLOONS) ×2 IMPLANT
PACK LAPAROSCOPY BASIN (CUSTOM PROCEDURE TRAY) ×4 IMPLANT
PACK TRENDGUARD 450 HYBRID PRO (MISCELLANEOUS) IMPLANT
PROTECTOR NERVE ULNAR (MISCELLANEOUS) ×8 IMPLANT
SCISSORS LAP 5X35 DISP (ENDOMECHANICALS) IMPLANT
SET CYSTO W/LG BORE CLAMP LF (SET/KITS/TRAYS/PACK) ×2 IMPLANT
SET IRRIG TUBING LAPAROSCOPIC (IRRIGATION / IRRIGATOR) ×4 IMPLANT
SHEARS HARMONIC ACE PLUS 36CM (ENDOMECHANICALS) ×2 IMPLANT
SLEEVE XCEL OPT CAN 5 100 (ENDOMECHANICALS) ×4 IMPLANT
SPONGE LAP 18X18 RF (DISPOSABLE) ×4 IMPLANT
STRIP CLOSURE SKIN 1/2X4 (GAUZE/BANDAGES/DRESSINGS) ×2 IMPLANT
SUT ENDO VLOC 180-0-8IN (SUTURE) ×2 IMPLANT
SUT VIC AB 0 CT1 18XCR BRD8 (SUTURE) IMPLANT
SUT VIC AB 0 CT1 27 (SUTURE) ×8
SUT VIC AB 0 CT1 27XBRD ANBCTR (SUTURE) ×4 IMPLANT
SUT VIC AB 0 CT1 36 (SUTURE) ×4 IMPLANT
SUT VIC AB 0 CT1 8-18 (SUTURE) ×12
SUT VIC AB 2-0 CT1 27 (SUTURE) ×4
SUT VIC AB 2-0 CT1 TAPERPNT 27 (SUTURE) IMPLANT
SUT VIC AB 3-0 PS2 18 (SUTURE) ×4
SUT VIC AB 3-0 PS2 18XBRD (SUTURE) ×2 IMPLANT
SUT VIC AB 4-0 KS 27 (SUTURE) ×2 IMPLANT
SUT VICRYL 0 UR6 27IN ABS (SUTURE) ×4 IMPLANT
SYR 10ML LL (SYRINGE) ×4 IMPLANT
SYR 50ML LL SCALE MARK (SYRINGE) ×4 IMPLANT
SYSTEM CARTER THOMASON II (TROCAR) ×2 IMPLANT
TIP UTERINE 5.1X6CM LAV DISP (MISCELLANEOUS) IMPLANT
TIP UTERINE 6.7X10CM GRN DISP (MISCELLANEOUS) IMPLANT
TIP UTERINE 6.7X6CM WHT DISP (MISCELLANEOUS) IMPLANT
TIP UTERINE 6.7X8CM BLUE DISP (MISCELLANEOUS) ×2 IMPLANT
TOWEL OR 17X24 6PK STRL BLUE (TOWEL DISPOSABLE) ×12 IMPLANT
TRAY FOLEY W/BAG SLVR 14FR (SET/KITS/TRAYS/PACK) ×4 IMPLANT
TRENDGUARD 450 HYBRID PRO PACK (MISCELLANEOUS) ×4
TROCAR XCEL NON-BLD 11X100MML (ENDOMECHANICALS) ×4 IMPLANT
TROCAR XCEL NON-BLD 5MMX100MML (ENDOMECHANICALS) ×4 IMPLANT
TUBING INSUF HEATED (TUBING) ×4 IMPLANT
WARMER LAPAROSCOPE (MISCELLANEOUS) ×4 IMPLANT

## 2018-03-20 NOTE — Progress Notes (Signed)
Day of Surgery Procedure(s) (LRB): Laparoscopic assisted hysterectomy with Bilateral salpingo oophorectomy with mini laparotomy (Bilateral) CYSTOSCOPY (N/A)  Subjective: Patient reports tolerating PO with ice chips and no nausea.  Feels like bladder is very full, but catheter draining well and bladder scan negative.   Very sleepy but able to ambulate to bathroom and back.    Objective: I have reviewed patient's vital signs and intake and output.  General: alert and cooperative GI: incision: clean, dry and intact and abdomen soft, NT  Assessment: s/p Procedure(s) with comments: Laparoscopic assisted hysterectomy with Bilateral salpingo oophorectomy with mini laparotomy (Bilateral) - extra 30 mins CYSTOSCOPY (N/A): stable  Plan: Advance to PO medication as able Suspect having some bladder spasms so will add urispas, good UOP Continue current management  LOS: 0 days    Logan Bores 03/20/2018, 4:09 PM

## 2018-03-20 NOTE — Anesthesia Preprocedure Evaluation (Addendum)
Anesthesia Evaluation  Patient identified by MRN, date of birth, ID band Patient awake    Reviewed: Allergy & Precautions, NPO status , Patient's Chart, lab work & pertinent test results  Airway Mallampati: II  TM Distance: >3 FB Neck ROM: Full    Dental  (+) Teeth Intact, Dental Advisory Given   Pulmonary neg pulmonary ROS,    Pulmonary exam normal breath sounds clear to auscultation       Cardiovascular hypertension, Normal cardiovascular exam Rhythm:Regular Rate:Normal     Neuro/Psych PSYCHIATRIC DISORDERS Depression negative neurological ROS     GI/Hepatic Neg liver ROS, GERD  Medicated and Controlled,  Endo/Other  Morbid obesity  Renal/GU negative Renal ROS     Musculoskeletal  (+) Arthritis ,   Abdominal   Peds  Hematology  (+) Blood dyscrasia, anemia ,   Anesthesia Other Findings Day of surgery medications reviewed with the patient.  H/o breast cancer  Reproductive/Obstetrics                            Anesthesia Physical Anesthesia Plan  ASA: III  Anesthesia Plan: General   Post-op Pain Management:    Induction: Intravenous  PONV Risk Score and Plan: 4 or greater and Diphenhydramine, Scopolamine patch - Pre-op, Midazolam, Dexamethasone and Ondansetron  Airway Management Planned: Oral ETT  Additional Equipment:   Intra-op Plan:   Post-operative Plan: Extubation in OR  Informed Consent: I have reviewed the patients History and Physical, chart, labs and discussed the procedure including the risks, benefits and alternatives for the proposed anesthesia with the patient or authorized representative who has indicated his/her understanding and acceptance.   Dental advisory given  Plan Discussed with: CRNA  Anesthesia Plan Comments:        Anesthesia Quick Evaluation

## 2018-03-20 NOTE — Anesthesia Procedure Notes (Signed)
Procedure Name: Intubation Date/Time: 03/20/2018 7:35 AM Performed by: Catalina Gravel, MD Pre-anesthesia Checklist: Patient identified, Patient being monitored, Timeout performed, Emergency Drugs available and Suction available Patient Re-evaluated:Patient Re-evaluated prior to induction Oxygen Delivery Method: Circle System Utilized Preoxygenation: Pre-oxygenation with 100% oxygen Induction Type: IV induction Ventilation: Mask ventilation without difficulty Laryngoscope Size: Miller and 2 Grade View: Grade II Tube type: Oral Tube size: 7.0 mm Number of attempts: 1 Airway Equipment and Method: stylet Placement Confirmation: ETT inserted through vocal cords under direct vision,  positive ETCO2 and breath sounds checked- equal and bilateral Secured at: 22 cm Tube secured with: Tape Dental Injury: Teeth and Oropharynx as per pre-operative assessment

## 2018-03-20 NOTE — Transfer of Care (Signed)
Immediate Anesthesia Transfer of Care Note  Patient: Connee Ikner  Procedure(s) Performed: Laparoscopic assisted hysterectomy with Bilateral salpingo oophorectomy with mini laparotomy (Bilateral Abdomen) CYSTOSCOPY (N/A Bladder)  Patient Location: PACU  Anesthesia Type:General  Level of Consciousness: awake, alert , oriented and sedated  Airway & Oxygen Therapy: Patient Spontanous Breathing, Patient connected to face mask oxygen and non-rebreather face mask  Post-op Assessment: Report given to RN, Post -op Vital signs reviewed and stable and Patient moving all extremities X 4  Post vital signs: Reviewed and stable  Last Vitals:  Vitals Value Taken Time  BP 120/86 03/20/2018 11:30 AM  Temp    Pulse 88 03/20/2018 11:33 AM  Resp 13 03/20/2018 11:33 AM  SpO2 95 % 03/20/2018 11:33 AM  Vitals shown include unvalidated device data.  Last Pain:  Vitals:   03/20/18 0647  TempSrc: Oral      Patients Stated Pain Goal: 3 (97/98/92 1194)  Complications: No apparent anesthesia complications

## 2018-03-20 NOTE — Op Note (Addendum)
Operative Note    Preoperative Diagnosis Abnormal bleeding, thickened uterine lining on tamoxifen Right adnexal cysts Fibroid uterus s/p ER+ breast cancer  Postoperative Diagnosis same  Procedure Laparoscopic assisted hysterectomy with minilaparotomy to remove uterus/BSO Cystoscopy  Surgeon Juliann Pulse Parmvir Boomer,MD Carlynn Purl, DO  Anesthesia GETA  Fluids: EBL 542mL UOP192mL clear IVF 2317mL LR  Findings The uterus was enlarged with fibroids, about 12-14 weeks size. Dense adhesions of bladder to anterior uterus and right side wall.  Left ovary WNL, right ovary with benign appearing cysts.  The cervix was very anterior displaced and the distal vagina narrow.  Due to the limited visibility and size of the uterus, Dr. Ivor Costa assistance was needed to complete the case.  Specimen Uterus, cervix,  tubes, and ovaries  Procedure Note Patient was taken to the operating room where general anesthesia was obtained without difficulty she was prepped and draped in the normal sterile fashion in the dorsal lithotomy position. An appropriate time out was performed. A 8 mm Rumi with cervical cup was placed after dilation and stay sutures placed to hold the cup onto the cervix at 3 and 6 o'clock.  The vaginal occluder was filled and a Foley catheter placed in the bladder. Attention was then turned to the abdomen where of infraumbilical incision was made after injection with quarter percent Marcaine approximate 2 cm in width. The fascia was then identified and grasped with Coker clamps and elevated. The fascia was then opened sharply with Mayo scissors and intraperitoneal placement of the Sheryle Hail was performed, after a pursestring suture of 0 Vicryl was placed around the fascial edge. With the [facility] in place the pneumoperitoneum was obtained and the pelvis and abdomen inspected with findings as previously stated. Two additional  ports were placed under direct visualization from the lateral upper  quadrants. A 51mm port was placed on the right and a 22mm mm port on the left.   Each port site was injected with quarter percent Marcaine prior placement.   With patient in Trendelenburg the uterus and tubes and ovaries were inspected with findings as previously stated, there was limited mobilty due to the dense adhesions. The Harmonic scalpel was then utilized to take down the infundibulopelvic ligament, broad ligament and round ligaments.   The dense adhesions to the anterior and right sidewall were taken down carefully with the Harmonic scalpel.   The bladder flap was taken down from the lower uterine segment and pushed away to expose the cervix.  The uterine artery was then taken down on the left.  There was less visibility on the right, and the artery could not clearly be identified despite substantial dissection.  The uterus was also too bulky to be delivered from the vagina without morsellation.  Given her thickened endometrium on tamoxifen, the decision was made to perform a minilaparotomy through her old incision to finish removing the uterus and remove it intact.  A scalpel was used to make an incision along the mid third of her old c-section incision.  The fascia was identified and an incision made in the midline and extended laterally with mayo scissors.  The fascia was elevated in coker claps and dissected off the rectus muscles underneath.  These were then separated in the midline and the cavity entered with the pneumoperitoneum then reduced.   The previously placed trocars were all removed, and the pursestring of the [facility] tied down with great closure noted.  Attention was the turned to the right uterocervical junction and zeppelin clamps used to  take down the uterine artery and remaining adhesions freed.  The paracervical tissue was then taken down bilaterally with Zeppelin clamps down to the level of the vaginal cuff. Each pedicle was cut and suture ligated with 0-vicryl. At the level of the cuff  the vagina was cross-clamped with Zeppelins and the uterus completely amputated. The cuff was then closed with several figure-of eight  sutures of 0-vicryl and hemostasis achieved.  After irrigation and no bleeding noted all instruments and sponges were removed from abdomen.  The peritoneum was closed with a running 2-3 suture.The fascia was closed with 0-vicryl.  The subcutaneous tissue reapproximated with 0 plain suture and the skin closed with 3- vicryl in a subcuticular stitch.  The infraumbilical incision and lateral incisions were closed with a subcuticular stitch of 3-0 Vicryl.  Dermabond and a bandage were placed. Attention was returned vaginally where the foley was removed and a cystoscope inserted and the bladder filled with about 221ml.  There were no stitches or injuries noted in the bladder.  Both ureteral jets were visualized and normal. The cystoscope was removed and the foley replaced. Patient was then awakened and taken to the recovery room in good condition.

## 2018-03-20 NOTE — Progress Notes (Signed)
Patient ID: [name] [name], female   DOB: [birth year], 53 y.o.   MRN: [id number] Pt denies changes in dictated H&P.  Brief exam WNL.  Ready to proceed.

## 2018-03-21 DIAGNOSIS — N840 Polyp of corpus uteri: Secondary | ICD-10-CM | POA: Diagnosis not present

## 2018-03-21 LAB — BASIC METABOLIC PANEL
Anion gap: 6 (ref 5–15)
BUN: 10 mg/dL (ref 6–20)
CO2: 28 mmol/L (ref 22–32)
Calcium: 8.1 mg/dL — ABNORMAL LOW (ref 8.9–10.3)
Chloride: 104 mmol/L (ref 98–111)
Creatinine, Ser: 0.89 mg/dL (ref 0.44–1.00)
GFR calc Af Amer: >60 mL/min (ref >60)
GFR calc non Af Amer: >60 mL/min (ref >60)
GLUCOSE: 110 mg/dL — AB (ref 70–99)
Potassium: 4.5 mmol/L (ref 3.5–5.1)
Sodium: 138 mmol/L (ref 135–145)

## 2018-03-21 LAB — CBC
HCT: 27.3 % — ABNORMAL LOW (ref 36.0–46.0)
Hemoglobin: 8.7 g/dL — ABNORMAL LOW (ref 12.0–15.0)
MCH: 28.3 pg (ref 26.0–34.0)
MCHC: 31.9 g/dL (ref 30.0–36.0)
MCV: 88.9 fL (ref 80.0–100.0)
PLATELETS: 219 10*3/uL (ref 150–400)
RBC: 3.07 MIL/uL — ABNORMAL LOW (ref 3.87–5.11)
RDW: 14 % (ref 11.5–15.5)
WBC: 10 10*3/uL (ref 4.0–10.5)
nRBC: 0 % (ref 0.0–0.2)

## 2018-03-21 MED ORDER — OXYCODONE HCL 5 MG PO TABS: 5.0000 mg | ORAL_TABLET | ORAL | 0 refills | Status: DC | PRN

## 2018-03-21 MED ORDER — IBUPROFEN 800 MG PO TABS: 800.0000 mg | ORAL_TABLET | Freq: Three times a day (TID) | ORAL | 0 refills | Status: DC

## 2018-03-21 NOTE — Anesthesia Postprocedure Evaluation (Signed)
Anesthesia Post Note  Patient: Mikhaela Zaugg  Procedure(s) Performed: Laparoscopic assisted hysterectomy with Bilateral salpingo oophorectomy with mini laparotomy (Bilateral Abdomen) CYSTOSCOPY (N/A Bladder)     Patient location during evaluation: PACU Anesthesia Type: General Level of consciousness: awake and alert Pain management: pain level controlled Vital Signs Assessment: post-procedure vital signs reviewed and stable Respiratory status: spontaneous breathing, nonlabored ventilation, respiratory function stable and patient connected to nasal cannula oxygen Cardiovascular status: blood pressure returned to baseline and stable Postop Assessment: no apparent nausea or vomiting Anesthetic complications: no    Last Vitals:  Vitals:   03/21/18 0330 03/21/18 0757  BP: 109/69 111/65  Pulse: 70 67  Resp: 18 18  Temp: 36.9 C 37.3 C  SpO2: 100% 100%    Last Pain:  Vitals:   03/21/18 0757  TempSrc: Oral  PainSc:                  Catalina Gravel

## 2018-03-21 NOTE — Progress Notes (Signed)
1 Day Post-Op Procedure(s) (LRB): Laparoscopic assisted hysterectomy with Bilateral salpingo oophorectomy with mini laparotomy (Bilateral) CYSTOSCOPY (N/A)  Subjective: Patient reports feeling much better this AM.  Alert and awake.  Tolerated dinner last PM and pain minimal this AM with po meds.  Foley out a few hours and has not yet voided.  Objective: I have reviewed patient's vital signs, intake and output and labs.  General: alert and cooperative GI: incision: clean, dry and intact and abdomen soft Vaginal Bleeding: minimal  Assessment: s/p Procedure(s) with comments: Laparoscopic assisted hysterectomy with Bilateral salpingo oophorectomy with mini laparotomy (Bilateral) - extra 30 mins CYSTOSCOPY (N/A): progressing well  Plan: Discharge home later today if voids without problem Reviewed surgery in detail Pt on celexa already and will call if hot flashes not tolerable  LOS: 0 days    Logan Bores 03/21/2018, 9:02 AM

## 2018-03-21 NOTE — Progress Notes (Signed)
Discharge instructions reviewed with patient and support person.  Discussed post operative care at home, follow up appointment and medications changes.  Patient verbalized understanding.

## 2018-03-21 NOTE — Discharge Summary (Signed)
Physician Discharge Summary  Patient ID: [name] [name] MRN: [id number] DOB/AGE: 54/03/1965 54 y.o.  Admit date: 03/20/2018 Discharge date: 03/21/2018  Admission Diagnoses: Abnormal uterine bleeding, thickened endometrium Ovarian cysts H/o ER+ breast cancer  Discharge Diagnoses:  Active Problems:   S/P hysterectomy with oophorectomy (Laparoscopic assisted hysterectomy with BSO and minilaparotomy  Discharged Condition: good  Hospital Course: Pt admitted to observation following surgery and did well.  By postoperative day 1 she was tolerating po, ambulating and her pain was well controlled.  She had her foley catheter out and as long as passes her voiding trial, she will go home later this PM   Significant Diagnostic Studies: labs: cbc, bmp  Treatments: surgery: Laparoscopic assisted hyssterectomy with BSO  Discharge Exam: Blood pressure 111/65, pulse 67, temperature 99.1 F (37.3 C), temperature source Oral, resp. rate 18, height 5' 6.5" (1.689 m), weight 116.8 kg, last menstrual period 03/11/2018, SpO2 100 %. Pt alert and awake Abdomen soft and incisions clear and dressings dry/intact Minimal VB  Disposition: Discharge disposition: 01-Home or Self Care       Discharge Instructions    Call MD for:  persistant nausea and vomiting   Complete by:  As directed    Call MD for:  redness, tenderness, or signs of infection (pain, swelling, redness, odor or green/yellow discharge around incision site)   Complete by:  As directed    Call MD for:  severe uncontrolled pain   Complete by:  As directed    Call MD for:  temperature >100.4   Complete by:  As directed    Diet - low sodium heart healthy   Complete by:  As directed    Discharge instructions   Complete by:  As directed    Avoid driving for at least 1-2 weeks or until off narcotic pain meds.  No heavy lifting greater than 10 lbs.  Nothing in vagina for 6 weeks.  May remove bandage in 3 days.  Shower over incision  and pat dry.     Allergies as of 03/21/2018      Reactions   Prednisolone Hives      Medication List    TAKE these medications   ibuprofen 800 MG tablet Commonly known as:  ADVIL,MOTRIN Take 1 tablet (800 mg total) by mouth every 8 (eight) hours.   oxyCODONE 5 MG immediate release tablet Commonly known as:  Oxy IR/ROXICODONE Take 1-2 tablets (5-10 mg total) by mouth every 4 (four) hours as needed for moderate pain.      Follow-up Information    Paula Compton, MD. Schedule an appointment as soon as possible for a visit in 2 week(s).   Specialty:  Obstetrics and Gynecology Why:  incision check Contact information: [place] [address] [phone]           Signed: Logan Bores 03/21/2018, 9:16 AM

## 2018-03-22 ENCOUNTER — Encounter (HOSPITAL_COMMUNITY): Payer: Self-pay | Admitting: Obstetrics and Gynecology

## 2018-04-18 NOTE — Progress Notes (Signed)
[place]  Telephone:(336) [phone] [phone]) [phone]   ID: Theia Dezeeuw   DOB: Jul 06, 1965  MR#: [id number]  GGY#:[id number]  Patient Care Team: Kathyrn Lass, MD as PCP - General (Family Medicine) Paula Compton, MD as Consulting Physician (Obstetrics and Gynecology) Gus Height as Registered Nurse (Oncology) Jonai Weyland, Virgie Dad, MD as Consulting Physician (Hematology and Oncology) Eppie Gibson, MD as Consulting Physician (Radiation Oncology) Alphonsa Overall, MD as Consulting Physician (General Surgery) [place], Loel Lofty, DO as Attending Physician (Plastic Surgery) OTHERS:   CHIEF COMPLAINT: left breast cancer   CURRENT TREATMENT: Anastrozole   BREAST CANCER HISTORY: From the original intake note:  [name] [name] is a 53 year old [place] woman who underwent a yearly screening mammography in 02/11/2011. This showed heterogeneously dense tissue and a possible mass in the left breast. Additional views 03/21/2011 showed an area of amorphous calcifications in the upper central aspect of the left breast, measuring up to 3.0 cm. There was no associated mass. Biopsy was performed the same day and showed ([id number]) invasive ductal breast cancer, grade 1-2, with ductal carcinoma in situ, grade 1-3. The invasive tumor was 57% estrogen receptor positive, 97% progesterone receptor positive, with an MIB-1 of 10% and no HER-2 amplification. With this information the patient proceeded to bilateral breast MRIs 03/31/2011 this showed a round enhancing mass with irregular margins measuring 6 mm, with an associated biopsy clip. There was additional clumped and linear enhancement of compatible with DCIS the total area measuring 3.1 cm. There were no other suspicious masses in either breast and there was no axillary or internal mammary adenopathy noted.   Her subsequent history is as detailed below.   INTERVAL HISTORY:  [name] [name] returns today for follow-up and  treatment of her estrogen positive breast cancer.   She discontinued tamoxifen after her surgery.  She has not noticed any change since going off that medication.  Since her last visit here, she underwent a laparoscopic assisted hysterectomy with bilateral salpingo oophorectomy with mini laparotomy on 03/20/2018. The pathology from this procedure showed ([id number]):  Uterus, cervix and bilateral fallopian tubes, and ovaries  1. uterus:   - endometrium: endometrial type polyps. No hyperplasia or malignancy.   - myometrium: leiomyomata. No malignancy.   - serosa: fibrous adhesions. No malignancy.  2. cervix: benign squamous and endocervical mucosa. No dysplasia or malignancy.  3. bilateral ovaries: hemorrhagic cystic follicles and benign serous cystadenoma. No malignancy.  4. bilateral fallopian tubes: paratubal cysts. No malignancy.    REVIEW OF SYSTEMS: [name] [name] is still on recovery from her surgery. Prior to her surgery, she hadn't had a period for a year, which rased concern from her GYN since her estrogen was still high. She is feeling some neuropathy in her left fingertips, which she is unsure if it is from her surgery or another cause.  She has been walking. She returns to work on 04/25/2018. She states that she is losing weight. She has had some bad headaches, but no migraines, which she says she normally gets after surgery. She has a cough, with she attributes to her GERD. The patient denies unusual headaches, visual changes, nausea, vomiting, or dizziness. There has been no phlegm production, or pleurisy. This been no change in bowel or bladder habits. The patient denies unexplained fatigue or unexplained weight loss, bleeding, rash, or fever. A detailed review of systems was otherwise noncontributory.    PAST MEDICAL HISTORY: Past Medical History:  Diagnosis Date  . Anemia    not at present  .  Arthritis    Osteoarthritis Neck and Back  . Breast cancer ([name] [name])   . Cancer [facility]) 2013    Left breast cancer  . Depression   . DJD (degenerative joint disease)    Cervical and lumbar spin  . Family history of uterine cancer    sister  . GERD (gastroesophageal reflux disease)   . Hx of seizure disorder    Patient denies this dx - pt states "never had a seizure"  . Hypertension    Hx prior to lap band surgery 10 yrs ago, lost weight, no BP issues since surgery, no medication  . Personal history of radiation therapy    2017 left breast  . S/P radiation therapy 06/13/2011 - 07/22/11   Left Breast/ 50 Gy in 25 Fractions: Left Breast Boost Boost/ 10 Gy in 5 Fractions  . Seasonal allergies   . 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 ER+, PR+, Ki-67 10%, Her2- No Amplification   She had a right breast biopsy remotely, which was benign. She has mild degenerative disc disease involving the cervical and lumbar spines. She status post lap band surgery November of 2010, and has lost 92 pounds so far. She has a history of seasonal allergies, hypertension which is improving eyes she continues to lose weight, history of postpartum migraines, resolved, history of seizure, remote, and is status post C-section x2 and dilatation and curettage times one   PAST SURGICAL HISTORY: Past Surgical History:  Procedure Laterality Date  . BIOPSY BREAST  08/07/09   Breast, Right Needle core Biopsy, UOQ - Pseudoangiomatous Stromal hyperplasia  . BREAST BIOPSY  03/21/11  . BREAST LUMPECTOMY  04/19/11   snbx  . BREAST LUMPECTOMY  05/10/11   Left Breast Lumpectomy re-excision  . BREAST REDUCTION SURGERY  02/15/2012   Procedure: MAMMARY REDUCTION  (BREAST);  Surgeon: Theodoro Kos, DO;  Location: [facility];  Service: Plastics;  Laterality: Right;  RIGHT BREAST MASTOPEXY  . [place], 2005   x 2  . COLONOSCOPY    . CYSTOSCOPY N/A 03/20/2018   Procedure: CYSTOSCOPY;  Surgeon: Paula Compton, MD;  Location: [facility];  Service: Gynecology;  Laterality: N/A;  . DILATION AND CURETTAGE OF UTERUS     X 1  . LAPAROSCOPIC GASTRIC BANDING  01/27/2009  . REDUCTION MAMMAPLASTY Right   . TUBAL LIGATION      FAMILY HISTORY Family History  Problem Relation Age of Onset  . Stroke Mother        Stroke - mini  . Cancer Father        Lymphoma - passed age 65  . Skin cancer Father   . Skin cancer Sister   . Uterine cancer Sister 18   The patient's father died at the age of 42 from sepsis in the setting of lymphoma. The patient's mother died at the age of 29 after a heart attack in the setting of multiple strokes. The patient had no brothers. She has 3 sisters. There is no history of breast or ovarian cancer in the family   GYNECOLOGIC HISTORY: Menarche age 89, first pregnancy to term age 56. She is GX P2. As noted above, she still having periods through tamoxifen, although they are slightly less frequent and briefer. She is s/p BTL.  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 She divorced her first husband, with 67 and 26 year old sons at home, with shared custody. Her 78 year old son is currently at college in [place]. She married Larose Batres in 2014. He works as a Medical illustrator. He has 2 daughters from a prior marriage, age 50 and 21. She attends [employer] locally   ADVANCED DIRECTIVES: not in place  HEALTH MAINTENANCE: Social History   Tobacco Use  . Smoking status: Never Smoker  . Smokeless tobacco: Never Used  Substance Use Topics  . Alcohol use: Yes    Alcohol/week: 2.0 - 3.0 standard drinks    Types: 2 - 3 Glasses of wine per week  . Drug use: No    Colonoscopy:  PAP:  Bone density:  Lipid panel:  Allergies  Allergen Reactions  . Prednisolone Hives    Current Outpatient  Medications  Medication Sig Dispense Refill  . ibuprofen (ADVIL,MOTRIN) 800 MG tablet Take 1 tablet (800 mg total) by mouth every 8 (eight) hours. 30 tablet 0  . oxyCODONE (OXY IR/ROXICODONE) 5 MG immediate release tablet Take 1-2 tablets (5-10 mg total) by mouth every 4 (four) hours as needed for moderate pain. 30 tablet 0   No current facility-administered medications for this visit.     OBJECTIVE: Morbidly obese white woman who appears well  Vitals:   04/19/18 0957  BP: (!) 164/91  Pulse: 68  Resp: 18  Temp: 99 F (37.2 C)  SpO2: 100%     Body mass index is 41.03 kg/m.    ECOG FS: 0 Filed Weights   04/19/18 0957  Weight: 258 lb 1.6 oz (117.1 kg)   Patient's blood pressure at home is consistently in the 120/over 80 range  Sclerae unicteric, EOMs intact Oropharynx clear and moist No cervical or supraclavicular adenopathy Lungs no rales or rhonchi Heart regular rate and rhythm Abd soft, nontender, positive bowel sounds MSK no focal spinal tenderness, no upper extremity lymphedema Neuro: nonfocal, well oriented, appropriate affect Breasts: Right breast is unremarkable.  The left breast is status post lumpectomy and radiation.  There is no evidence of disease recurrence.  Both axillae are benign.    LAB RESULTS:  Lab Results  Component Value Date   WBC 10.0 03/21/2018   NEUTROABS 3.4 11/22/2017   HGB 8.7 (L) 03/21/2018   HCT 27.3 (L) 03/21/2018   MCV 88.9 03/21/2018   PLT 219 03/21/2018      Chemistry      Component Value Date/Time   NA 138 03/21/2018 0533   NA 141 11/22/2016 0854   K 4.5 03/21/2018 0533   K 4.0 11/22/2016 0854   CL 104 03/21/2018 0533   CL 105 05/01/2012 1044   CO2 28 03/21/2018 0533   CO2 29 11/22/2016 0854   BUN 10 03/21/2018 0533   BUN 8.8 11/22/2016 0854   CREATININE 0.89 03/21/2018 0533   CREATININE 0.91 11/22/2017 0820   CREATININE 0.8 11/22/2016 0854      Component Value Date/Time   CALCIUM 8.1 (L) 03/21/2018 0533   CALCIUM  9.4 11/22/2016 0854   ALKPHOS 15 (L) 03/15/2018 0835   ALKPHOS 17 (L) 11/22/2016 0854   AST 21 03/15/2018 0835   AST 15 11/22/2017 0820   AST 15 11/22/2016 0854   ALT 18 03/15/2018 0835   ALT 12 11/22/2017 0820   ALT 12 11/22/2016 0854   BILITOT 0.5 03/15/2018 0835   BILITOT 0.3 11/22/2017 0820   BILITOT 0.42  11/22/2016 0854       Lab Results  Component Value Date   LABCA2 23 04/05/2011    No components found for: [id number]  No results for input(s): INR in the last 168 hours.  Urinalysis No results found for: COLORURINE    STUDIES: Surgical results discussed with the patient  ASSESSMENT: 52 y.o.  [place] woman status post Left lumpectomy and sentinel lymph node sampling 04/19/2011 for a T2 N0, stage IIA invasive ductal carcinoma, grade 1, estrogen and progesterone receptor positive at 57% and 97% respectively, HER-2 negative, with an MIB-1 of 10.  (1) Her Oncotype Dx predicts a distant recurrence rate of 13% if her only systemic treatment is tamoxifen for 5 years.  (2) completed radiation 07/22/2011  (3) started tamoxifen May 2013--switched to anastrozole 04/19/2018  (a) status post laparoscopic hysterectomy and bilateral salpingo-oophorectomy 03/20/2018 with benign pathology (a benign serous cystadenoma removed)  (b) bone density pending  (c) continue anastrozole through February 2022  (4) genetics testing offered to patient and rediscussed September 2016: Patient opts against it  (a) tested 01/06/17.  Genetic testing was negative and no deleterious mutations were identified.  Genetic testing did detect a Variant of Unknown Significance in the BRCA1 gene called c.343C>A (p.Pro115Thr). Genes tested include:  ALK, APC, ATM, AXIN2,BAP1,  BARD1, BLM, BMPR1A, BRCA1, BRCA2, BRIP1, CASR, CDC73, CDH1, CDK4, CDKN1B, CDKN1C, CDKN2A (p14ARF), CDKN2A (p16INK4a), CEBPA, CHEK2, CTNNA1, DICER1, DIS3L2, EGFR (c.2369C>T, p.Thr790Met variant only), EPCAM (Deletion/duplication testing  only), FH, FLCN, GATA2, GPC3, GREM1 (Promoter region deletion/duplication testing only), HOXB13 (c.251G>A, p.Gly84Glu), HRAS, KIT, MAX, MEN1, MET, MITF (c.952G>A, p.Glu318Lys variant only), MLH1, MSH2, MSH3, MSH6, MUTYH, NBN, NF1, NF2, NTHL1, PALB2, PDGFRA, PHOX2B, PMS2, POLD1, POLE, POT1, PRKAR1A, PTCH1, PTEN, RAD50, RAD51C, RAD51D, RB1, RECQL4, RET, RUNX1, SDHAF2, SDHA (sequence changes only), SDHB, SDHC, SDHD, SMAD4, SMARCA4, SMARCB1, SMARCE1, STK11, SUFU, TERT, TERT, TMEM127, TP53, TSC1, TSC2, VHL, WRN and WT1.   PLAN:   [name] did very well with her surgery and what is more remarkable she has had absolutely no postmenopausal symptoms to this point.  It is relatively early, so she may yet develop hot flashes, vaginal dryness, weight gain, insomnia, and some of the other new sinus problems associated with menopause, but for now she is doing great.  She went off tamoxifen with no obvious change in any symptoms.  She was tolerating it quite well.  She is now ready to start anastrozole.  She will take this for 2 years.  We discussed the possible toxicity side effects and complications of this agent and she was given a handout with additional information as well.  I have placed the prescription and she will started as soon as she receives the drug from her mail order.  We need a baseline bone density and hopefully we can get that done within the next week  She will have her next mammogram in April.  She will return to see me in June.  If all is well I will start seeing her yearly from that point to completion of her treatment  She has a mild left ulnar neuropathy.  This hopefully will resolve in a few days but if it does not she will let me know  She knows to call for any other issues that may develop before the next visit.   Zayquan Bogard, Virgie Dad, MD  04/19/18 10:21 AM Medical Oncology and Hematology [facility] [address] [place], [place] [address] Tel. [phone]    Fax.  [phone]  I, [employer] am acting as a Education administrator for Chauncey Cruel, MD.   I, Lurline Del MD, have reviewed the above documentation for accuracy and completeness, and I agree with the above.

## 2018-04-19 ENCOUNTER — Inpatient Hospital Stay: Payer: 59 | Attending: Oncology | Admitting: Oncology

## 2018-04-19 ENCOUNTER — Telehealth: Payer: Self-pay | Admitting: Oncology

## 2018-04-19 ENCOUNTER — Telehealth: Payer: Self-pay | Admitting: *Deleted

## 2018-04-19 VITALS — BP 164/91 | HR 68 | Temp 99.0°F | Resp 18 | Ht 66.5 in | Wt 258.1 lb

## 2018-04-19 DIAGNOSIS — Z170 Estrogen receptor positive status [ER+]: Secondary | ICD-10-CM | POA: Diagnosis not present

## 2018-04-19 DIAGNOSIS — G5622 Lesion of ulnar nerve, left upper limb: Secondary | ICD-10-CM | POA: Insufficient documentation

## 2018-04-19 DIAGNOSIS — Z6841 Body Mass Index (BMI) 40.0 and over, adult: Secondary | ICD-10-CM

## 2018-04-19 DIAGNOSIS — C50412 Malignant neoplasm of upper-outer quadrant of left female breast: Secondary | ICD-10-CM | POA: Diagnosis not present

## 2018-04-19 MED ORDER — ANASTROZOLE 1 MG PO TABS: 1.0000 mg | ORAL_TABLET | Freq: Every day | ORAL | 4 refills | Status: DC

## 2018-04-19 NOTE — Telephone Encounter (Signed)
Gave avs and calendar ° °

## 2018-04-20 ENCOUNTER — Other Ambulatory Visit: Payer: Self-pay | Admitting: *Deleted

## 2018-04-23 ENCOUNTER — Ambulatory Visit: Payer: Self-pay | Admitting: Oncology

## 2018-04-25 ENCOUNTER — Ambulatory Visit
Admission: RE | Admit: 2018-04-25 | Discharge: 2018-04-25 | Disposition: A | Discharge status: Home / Self Care | Payer: 59 | Source: Ambulatory Visit | Attending: Oncology | Admitting: Oncology

## 2018-04-25 DIAGNOSIS — Z170 Estrogen receptor positive status [ER+]: Principal | ICD-10-CM

## 2018-04-25 DIAGNOSIS — C50412 Malignant neoplasm of upper-outer quadrant of left female breast: Secondary | ICD-10-CM

## 2018-05-14 ENCOUNTER — Other Ambulatory Visit: Payer: Self-pay | Admitting: Obstetrics and Gynecology

## 2018-05-14 DIAGNOSIS — Z1231 Encounter for screening mammogram for malignant neoplasm of breast: Secondary | ICD-10-CM

## 2018-06-29 ENCOUNTER — Ambulatory Visit: Payer: Self-pay

## 2018-07-04 ENCOUNTER — Ambulatory Visit: Payer: Self-pay

## 2018-08-16 ENCOUNTER — Telehealth: Payer: Self-pay | Admitting: *Deleted

## 2018-08-20 ENCOUNTER — Ambulatory Visit
Admission: RE | Admit: 2018-08-20 | Discharge: 2018-08-20 | Disposition: A | Discharge status: Home / Self Care | Payer: 59 | Source: Ambulatory Visit | Attending: Obstetrics and Gynecology | Admitting: Obstetrics and Gynecology

## 2018-08-20 ENCOUNTER — Other Ambulatory Visit: Payer: Self-pay

## 2018-08-20 DIAGNOSIS — Z1231 Encounter for screening mammogram for malignant neoplasm of breast: Secondary | ICD-10-CM

## 2018-09-05 NOTE — Progress Notes (Signed)
[place]  [phone] [phone]   ID: [name] [name]   DOB: 23-Dec-1965  MR#: [id number]  OYD#:[id number]  Patient Care Team: [name] Lass, MD as PCP - General (Family Medicine) [name] Compton, MD as Consulting Physician (Obstetrics and Gynecology) [name] [name] as Registered Nurse (Oncology) [name] [name], [name] Dad, MD as Consulting Physician (Hematology and Oncology) [name] Gibson, MD as Consulting Physician (Radiation Oncology) [name] Overall, MD as Consulting Physician (General Surgery) [name] [name], [name] Lofty, DO as Attending Physician (Plastic Surgery) OTHERS:   CHIEF COMPLAINT: Estrogen receptor positive breast cancer   CURRENT TREATMENT: Observation  INTERVAL HISTORY:  [name] [name] returns today for follow-up and treatment of her estrogen positive breast cancer.   She was started on anastrozole at her last visit in February 2020. She tolerates this well. She reports frequent hot flashes.  Since her last visit, she underwent bilateral screening mammography with tomography at [facility] on 08/20/2018 showing: breast density category B; no evidence of malignancy in either breast.  She also underwent [facility] density [facility] This showed a T-score of 0.8, which is normal.   REVIEW OF SYSTEMS: [name] [name] reports taking Celexa, which has improved her mood. She walks for exercise, between a mile and 2-3 miles, every day. She also has some weights at home that she uses. She continues to work from home, and she wishes to go to the grocery store. A detailed review of systems was otherwise entirely negative.   BREAST CANCER HISTORY: From the original intake note:  [name] [name] is a 53 year old [place] woman who underwent a yearly screening mammography in 02/11/2011. This showed heterogeneously dense tissue and a possible mass in the left breast. Additional views 03/21/2011 showed an area of amorphous  calcifications in the upper central aspect of the left breast, measuring up to 3.0 cm. There was no associated mass.   Biopsy was performed the same day and showed ([id number]) invasive ductal breast cancer, grade 1-2, with ductal carcinoma in situ, grade 1-3. The invasive tumor was 57% estrogen receptor positive, 97% progesterone receptor positive, with an MIB-1 of 10% and no HER-2 amplification. With this information the patient proceeded to bilateral breast MRIs 03/31/2011 this showed a round enhancing mass with irregular margins measuring 6 mm, with an associated biopsy clip. There was additional clumped and linear enhancement of compatible with DCIS the total area measuring 3.1 cm. There were no other suspicious masses in either breast and there was no axillary or internal mammary adenopathy noted.   Her subsequent history is as detailed below.   PAST MEDICAL HISTORY: Past Medical History:  Diagnosis Date  . Anemia    not at present  . Arthritis    Osteoarthritis Neck and Back  . Breast cancer ([place])   . Cancer [facility]) 2013   Left breast cancer  . Depression   . DJD (degenerative joint disease)    Cervical and lumbar spin  . Family history of uterine cancer    sister  . GERD (gastroesophageal reflux disease)   . Hx of seizure disorder    Patient denies this dx - pt states "never had a seizure"  . Hypertension    Hx prior to lap band surgery 10 yrs ago, lost weight, no BP issues since surgery, no medication  . Personal history of radiation therapy    2017 left breast  . S/P radiation therapy 06/13/2011 - 07/22/11   Left Breast/ 50 Gy in 25 Fractions: Left Breast Boost  Boost/ 10 Gy in 5 Fractions  . Seasonal allergies   . 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 ER+, PR+, Ki-67 10%, Her2- No Amplification    PAST SURGICAL HISTORY: Past Surgical History:  Procedure Laterality Date  .  BIOPSY BREAST  08/07/09   Breast, Right Needle core Biopsy, UOQ - Pseudoangiomatous Stromal hyperplasia  . BREAST BIOPSY  03/21/11  . BREAST LUMPECTOMY  04/19/11   snbx  . BREAST LUMPECTOMY  05/10/11   Left Breast Lumpectomy re-excision  . BREAST REDUCTION SURGERY  02/15/2012   Procedure: MAMMARY REDUCTION  (BREAST);  Surgeon: Theodoro Kos, DO;  Location: [facility];  Service: Plastics;  Laterality: Right;  RIGHT BREAST MASTOPEXY  . [place], 2005   x 2  . COLONOSCOPY    . CYSTOSCOPY N/A 03/20/2018   Procedure: CYSTOSCOPY;  Surgeon: [name] Compton, MD;  Location: [facility];  Service: Gynecology;  Laterality: N/A;  . DILATION AND CURETTAGE OF UTERUS     X 1  . LAPAROSCOPIC GASTRIC BANDING  01/27/2009  . REDUCTION MAMMAPLASTY Right   . TUBAL LIGATION      FAMILY HISTORY Family History  Problem Relation Age of Onset  . Stroke Mother        Stroke - mini  . Cancer Father        Lymphoma - passed age 50  . Skin cancer Father   . Skin cancer Sister   . Uterine cancer Sister 43  The patient's father died at the age of 37 from sepsis in the setting of lymphoma. The patient's mother died at the age of 64 after a heart attack in the setting of multiple strokes. The patient had no brothers. She has 3 sisters. There is no history of breast or ovarian cancer in the family   GYNECOLOGIC HISTORY: Menarche age 45, first pregnancy to term age 48. She is GX P2. As noted above, she still having periods through tamoxifen, although they are slightly less frequent and briefer. She is s/p BTL.  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 She divorced her first husband, with 47 and 19 year old sons at home, with shared custody. Her 48 year old son is currently at college in [place]. She married [name] [name] in 2014. He  works as a Medical illustrator. He has 2 daughters from a prior marriage, age 62 and 72. She attends [employer] locally   ADVANCED DIRECTIVES: not in place   HEALTH MAINTENANCE: Social History   Tobacco Use  . Smoking status: Never Smoker  . Smokeless tobacco: Never Used  Substance Use Topics  . Alcohol use: Yes    Alcohol/week: 2.0 - 3.0 standard drinks    Types: 2 - 3 Glasses of wine per week  . Drug use: No    Colonoscopy:  PAP:  Bone density: 04/25/2018, 0.8 (normal)  Lipid panel:  Allergies  Allergen Reactions  . Prednisolone Hives    Current Outpatient Medications  Medication Sig Dispense Refill  . anastrozole (ARIMIDEX) 1 MG tablet Take 1 tablet (1 mg total) by mouth daily. 90 tablet 4  . cholecalciferol (VITAMIN D3) 25 MCG (1000 UT) tablet Take 1 tablet (1,000 Units total) by mouth daily.    . citalopram (CELEXA) 40 MG tablet Take 1 tablet (  40 mg total) by mouth daily.    . hydrochlorothiazide (HYDRODIURIL) 12.5 MG tablet Take 2 tablets (25 mg total) by mouth daily.    . lansoprazole (PREVACID) 30 MG capsule Take 1 capsule (30 mg total) by mouth daily at 12 noon.     No current facility-administered medications for this visit.     OBJECTIVE: Morbidly obese white woman in no acute distress  Vitals:   09/06/18 1006  BP: 133/70  Pulse: (!) 58  Resp: 18  Temp: (!) 97.5 F (36.4 C)  SpO2: 100%     Body mass index is 40.54 kg/m.    ECOG FS: 1 Filed Weights   09/06/18 1006  Weight: 255 lb (115.7 kg)   Sclerae unicteric, EOMs intact Wearing a mask No cervical or supraclavicular adenopathy Lungs no rales or rhonchi Heart regular rate and rhythm Abd soft, nontender, positive bowel sounds MSK no focal spinal tenderness, no upper extremity lymphedema Neuro: nonfocal, well oriented, appropriate affect Breasts: Right breast is unremarkable.  The left breast is status post lumpectomy and radiation.  There is no evidence of local recurrence.   Both axillae are benign.    LAB RESULTS:  Lab Results  Component Value Date   WBC 5.3 09/06/2018   NEUTROABS 2.9 09/06/2018   HGB 11.2 (L) 09/06/2018   HCT 35.1 (L) 09/06/2018   MCV 86.7 09/06/2018   PLT 290 09/06/2018      Chemistry      Component Value Date/Time   NA 143 09/06/2018 0940   NA 141 11/22/2016 0854   K 4.0 09/06/2018 0940   K 4.0 11/22/2016 0854   CL 102 09/06/2018 0940   CL 105 05/01/2012 1044   CO2 31 09/06/2018 0940   CO2 29 11/22/2016 0854   BUN 9 09/06/2018 0940   BUN 8.8 11/22/2016 0854   CREATININE 0.95 09/06/2018 0940   CREATININE 0.91 11/22/2017 0820   CREATININE 0.8 11/22/2016 0854      Component Value Date/Time   CALCIUM 9.6 09/06/2018 0940   CALCIUM 9.4 11/22/2016 0854   ALKPHOS 26 (L) 09/06/2018 0940   ALKPHOS 17 (L) 11/22/2016 0854   AST 35 09/06/2018 0940   AST 15 11/22/2017 0820   AST 15 11/22/2016 0854   ALT 40 09/06/2018 0940   ALT 12 11/22/2017 0820   ALT 12 11/22/2016 0854   BILITOT 0.4 09/06/2018 0940   BILITOT 0.3 11/22/2017 0820   BILITOT 0.42 11/22/2016 0854       Lab Results  Component Value Date   LABCA2 23 04/05/2011    No components found for: [id number]  No results for input(s): INR in the last 168 hours.  Urinalysis No results found for: COLORURINE    STUDIES: Mm 3d Screen Breast Bilateral  Result Date: 08/20/2018 CLINICAL DATA:  Screening. EXAM: DIGITAL SCREENING BILATERAL MAMMOGRAM WITH TOMO AND CAD COMPARISON:  Previous exam(s). ACR Breast Density Category b: There are scattered areas of fibroglandular density. FINDINGS: There are no findings suspicious for malignancy. Images were processed with CAD. IMPRESSION: No mammographic evidence of malignancy. A result letter of this screening mammogram will be mailed directly to the patient. RECOMMENDATION: Screening mammogram in one year. (Code:SM-B-01Y) BI-RADS CATEGORY  1: Negative. Electronically Signed   By: Claudie Revering M.D.   On: 08/20/2018 15:40      ASSESSMENT: 53 y.o.  [place] woman status post Left lumpectomy and sentinel lymph node sampling 04/19/2011 for a T2 N0, stage IIA invasive ductal carcinoma, grade 1, estrogen and progesterone  receptor positive at 57% and 97% respectively, HER-2 negative, with an MIB-1 of 10.  (1) Her Oncotype Dx predicts a distant recurrence rate of 13% if her only systemic treatment is tamoxifen for 5 years.  (2) completed radiation 07/22/2011  (3) started tamoxifen May 2013--switched to anastrozole 04/19/2018  (a) status post laparoscopic hysterectomy and bilateral salpingo-oophorectomy 03/20/2018 with benign pathology (a benign serous cystadenoma removed)  (b) bone densityy 04/25/2018 shwed T score 0.8 (normal)  (c) anastrozole discontinued June 2020 with side effects  (4) genetics testing offered to patient and rediscussed September 2016: Patient opts against it  (a) tested 01/06/17.  Genetic testing was negative and no deleterious mutations were identified.  Genetic testing did detect a Variant of Unknown Significance in the BRCA1 gene called c.343C>A (p.Pro115Thr). Genes tested include:  ALK, APC, ATM, AXIN2,BAP1,  BARD1, BLM, BMPR1A, BRCA1, BRCA2, BRIP1, CASR, CDC73, CDH1, CDK4, CDKN1B, CDKN1C, CDKN2A (p14ARF), CDKN2A (p16INK4a), CEBPA, CHEK2, CTNNA1, DICER1, DIS3L2, EGFR (c.2369C>T, p.Thr790Met variant only), EPCAM (Deletion/duplication testing only), FH, FLCN, GATA2, GPC3, GREM1 (Promoter region deletion/duplication testing only), HOXB13 (c.251G>A, p.Gly84Glu), HRAS, KIT, MAX, MEN1, MET, MITF (c.952G>A, p.Glu318Lys variant only), MLH1, MSH2, MSH3, MSH6, MUTYH, NBN, NF1, NF2, NTHL1, PALB2, PDGFRA, PHOX2B, PMS2, POLD1, POLE, POT1, PRKAR1A, PTCH1, PTEN, RAD50, RAD51C, RAD51D, RB1, RECQL4, RET, RUNX1, SDHAF2, SDHA (sequence changes only), SDHB, SDHC, SDHD, SMAD4, SMARCA4, SMARCB1, SMARCE1, STK11, SUFU, TERT, TERT, TMEM127, TP53, TSC1, TSC2, VHL, WRN and WT1.   PLAN:   [name] [name] is now over 7 years out from  definitive surgery for her breast cancer with no evidence of disease recurrence.  This is very favorable.  She is not tolerating anastrozole well.  She would like to come off that medication.  We discussed obtaining a breast cancer index to give her more information on the possible benefit but she feels that she took tamoxifen for almost 7years and at this point the small benefit that she might derive from continuing anastrozole does not balance the side effects that she is experiencing and the problems with quality of life.  She is interested in taking bioidentical hormones.  I explained that these are foods sold as medicines but not evaluated as medicines so I would not be able to tell her what her chance of developing blood clots is from taking bioidentical hormones much less what her risk of breast cancer developing is from taking those compounds.  She would have to take them entirely at her own risk  In general I discourage patients from taking supplements as medicines.  She would like to come off her Celexa.  She would discuss this with Dr. Sabra Heck.  She understands she cannot simply stop that medication but it needs to be tapered to off  The good news is that she is doing very well from a breast cancer point of view.  I am going to see her on a yearly basis for the next 3 years until she gets to her 10-year graduation point.  She knows to call for any other issue that may develop before the next visit here.  Jacquelyn Shadrick, [name] Dad, MD  09/06/18 10:43 AM Medical Oncology and Hematology [facility] [address] [place], [place] [address] Tel. [phone]    Fax. [phone]   I, Wilburn Mylar, am acting as scribe for Dr. Virgie [name]. Ardythe Klute.  I, Lurline Del MD, have reviewed the above documentation for accuracy and completeness, and I agree with the above.

## 2018-09-06 ENCOUNTER — Inpatient Hospital Stay: Payer: 59 | Attending: Oncology | Admitting: Oncology

## 2018-09-06 ENCOUNTER — Other Ambulatory Visit: Payer: Self-pay

## 2018-09-06 ENCOUNTER — Inpatient Hospital Stay: Payer: 59

## 2018-09-06 VITALS — BP 133/70 | HR 58 | Temp 97.5°F | Resp 18 | Ht 66.5 in | Wt 255.0 lb

## 2018-09-06 DIAGNOSIS — Z170 Estrogen receptor positive status [ER+]: Secondary | ICD-10-CM

## 2018-09-06 DIAGNOSIS — Z6841 Body Mass Index (BMI) 40.0 and over, adult: Secondary | ICD-10-CM

## 2018-09-06 DIAGNOSIS — Z853 Personal history of malignant neoplasm of breast: Secondary | ICD-10-CM | POA: Diagnosis not present

## 2018-09-06 DIAGNOSIS — C50412 Malignant neoplasm of upper-outer quadrant of left female breast: Secondary | ICD-10-CM

## 2018-09-06 LAB — CBC WITH DIFFERENTIAL/PLATELET
Abs Immature Granulocytes: 0.01 10*3/uL (ref 0.00–0.07)
Basophils Absolute: 0 10*3/uL (ref 0.0–0.1)
Basophils Relative: 0 %
Eosinophils Absolute: 0.2 10*3/uL (ref 0.0–0.5)
Eosinophils Relative: 3 %
HCT: 35.1 % — ABNORMAL LOW (ref 36.0–46.0)
Hemoglobin: 11.2 g/dL — ABNORMAL LOW (ref 12.0–15.0)
Immature Granulocytes: 0 %
Lymphocytes Relative: 36 %
Lymphs Abs: 1.9 10*3/uL (ref 0.7–4.0)
MCH: 27.7 pg (ref 26.0–34.0)
MCHC: 31.9 g/dL (ref 30.0–36.0)
MCV: 86.7 fL (ref 80.0–100.0)
Monocytes Absolute: 0.4 10*3/uL (ref 0.1–1.0)
Monocytes Relative: 7 %
Neutro Abs: 2.9 10*3/uL (ref 1.7–7.7)
Neutrophils Relative %: 54 %
Platelets: 290 10*3/uL (ref 150–400)
RBC: 4.05 MIL/uL (ref 3.87–5.11)
RDW: 13.7 % (ref 11.5–15.5)
WBC: 5.3 10*3/uL (ref 4.0–10.5)
nRBC: 0 % (ref 0.0–0.2)

## 2018-09-06 LAB — COMPREHENSIVE METABOLIC PANEL
ALT: 40 U/L (ref 0–44)
AST: 35 U/L (ref 15–41)
Albumin: 3.8 g/dL (ref 3.5–5.0)
Alkaline Phosphatase: 26 U/L — ABNORMAL LOW (ref 38–126)
Anion gap: 10 (ref 5–15)
BUN: 9 mg/dL (ref 6–20)
CO2: 31 mmol/L (ref 22–32)
Calcium: 9.6 mg/dL (ref 8.9–10.3)
Chloride: 102 mmol/L (ref 98–111)
Creatinine, Ser: 0.95 mg/dL (ref 0.44–1.00)
GFR calc Af Amer: >60 mL/min (ref >60)
GFR calc non Af Amer: >60 mL/min (ref >60)
Glucose, Bld: 92 mg/dL (ref 70–99)
Potassium: 4 mmol/L (ref 3.5–5.1)
Sodium: 143 mmol/L (ref 135–145)
Total Bilirubin: 0.4 mg/dL (ref 0.3–1.2)
Total Protein: 7.2 g/dL (ref 6.5–8.1)

## 2018-09-06 MED ORDER — ANASTROZOLE 1 MG PO TABS: 1.0000 mg | ORAL_TABLET | Freq: Every day | ORAL | 4 refills | Status: DC

## 2018-09-07 ENCOUNTER — Telehealth: Payer: Self-pay | Admitting: Oncology

## 2018-09-07 NOTE — Telephone Encounter (Signed)
I left a message regarding schedule  

## 2019-04-03 NOTE — Telephone Encounter (Signed)
No entry 

## 2019-07-08 ENCOUNTER — Telehealth: Payer: Self-pay | Admitting: Oncology

## 2019-07-08 NOTE — Telephone Encounter (Signed)
Rescheduled appts per provider PAL. Pt confirmed new appt date and time.  

## 2019-07-09 ENCOUNTER — Other Ambulatory Visit: Payer: Self-pay | Admitting: Obstetrics and Gynecology

## 2019-07-09 DIAGNOSIS — Z1231 Encounter for screening mammogram for malignant neoplasm of breast: Secondary | ICD-10-CM

## 2019-08-21 ENCOUNTER — Other Ambulatory Visit: Payer: Self-pay

## 2019-08-21 ENCOUNTER — Ambulatory Visit
Admission: RE | Admit: 2019-08-21 | Discharge: 2019-08-21 | Disposition: A | Discharge status: Home / Self Care | Payer: 59 | Source: Ambulatory Visit | Attending: Obstetrics and Gynecology | Admitting: Obstetrics and Gynecology

## 2019-08-21 DIAGNOSIS — Z1231 Encounter for screening mammogram for malignant neoplasm of breast: Secondary | ICD-10-CM

## 2019-09-09 ENCOUNTER — Ambulatory Visit: Payer: 59 | Admitting: Oncology

## 2019-09-09 ENCOUNTER — Other Ambulatory Visit: Payer: 59

## 2019-09-24 NOTE — Progress Notes (Signed)
[name]  [phone] [phone]   ID: [name]   DOB: 05/24/1965  MR#: [id number]  WLN#:[id number]  Patient Care Team: Kathyrn Lass, MD as PCP - General (Family Medicine) Paula Compton, MD as Consulting Physician (Obstetrics and Gynecology) Gus Height as Registered Nurse (Oncology) Verlia Kaney, Virgie Dad, MD as Consulting Physician (Hematology and Oncology) Eppie Gibson, MD as Consulting Physician (Radiation Oncology) Alphonsa Overall, MD as Consulting Physician (General Surgery) Dillingham, Loel Lofty, DO as Attending Physician (Plastic Surgery) OTHERS:   CHIEF COMPLAINT: Estrogen receptor positive breast cancer   CURRENT TREATMENT: Observation  INTERVAL HISTORY:  [name] [name] returns today for follow-up and treatment of her estrogen receptor positive breast cancer . She was last seen here on 09/06/2018.   She continues under observation alone.  She has not noted any change in either breast.  She has the occasional discomfort in the surgical breast and she understands that this is expected and does not mean there is disease recurrence  Since her last visit here, she underwent a digital screening bilateral bilateral mammogram with tomography on 08/21/2019 revealing no mammographic evidence of malignancy.   REVIEW OF SYSTEMS: [name] [name] has been under a great deal of stress because of her husband severe depression.  She is very dissatisfied with the behavioral health program at [facility] and she is going to write them a letter documenting that.  Her husband is now being taken care of through a program in [place] that she likes much better.  She is exercising by walking.  She has had some lower abdominal cramps and some diarrhea the last 2 weeks which concerned her.  Aside from this a detailed review of systems today was stable  BREAST CANCER HISTORY: From the original intake note:  [name] [name] is a 54 year old [place] woman who  underwent a yearly screening mammography in 02/11/2011. This showed heterogeneously dense tissue and a possible mass in the left breast. Additional views 03/21/2011 showed an area of amorphous calcifications in the upper central aspect of the left breast, measuring up to 3.0 cm. There was no associated mass.   Biopsy was performed the same day and showed ([id number]) invasive ductal breast cancer, grade 1-2, with ductal carcinoma in situ, grade 1-3. The invasive tumor was 57% estrogen receptor positive, 97% progesterone receptor positive, with an MIB-1 of 10% and no HER-2 amplification. With this information the patient proceeded to bilateral breast MRIs 03/31/2011 this showed a round enhancing mass with irregular margins measuring 6 mm, with an associated biopsy clip. There was additional clumped and linear enhancement of compatible with DCIS the total area measuring 3.1 cm. There were no other suspicious masses in either breast and there was no axillary or internal mammary adenopathy noted.   Her subsequent history is as detailed below.   PAST MEDICAL HISTORY: Past Medical History:  Diagnosis Date  . Anemia    not at present  . Arthritis    Osteoarthritis Neck and Back  . Breast cancer ([place])   . Cancer [facility]) 2013   Left breast cancer  . Depression   . DJD (degenerative joint disease)    Cervical and lumbar spin  . Family history of uterine cancer    sister  . GERD (gastroesophageal reflux disease)   . Hx of seizure disorder    Patient denies this dx - pt states "never had a seizure"  . Hypertension    Hx prior to lap band surgery 10 yrs ago, lost weight, no BP issues  since surgery, no medication  . Personal history of radiation therapy    2017 left breast  . S/P radiation therapy 06/13/2011 - 07/22/11   Left Breast/ 50 Gy in 25 Fractions: Left Breast Boost Boost/ 10 Gy in 5 Fractions  . Seasonal allergies   . Status post biopsy 03/21/11   Breast, Left, Needle Core Biopsy, [place] -  DCIS, Grade I-III with comedo Necrosis and Calcifications. ER+, PR+, Ki-67 10%, Her2- No Amplification    PAST SURGICAL HISTORY: Past Surgical History:  Procedure Laterality Date  . BIOPSY BREAST  08/07/09   Breast, Right Needle core Biopsy, UOQ - Pseudoangiomatous Stromal hyperplasia  . BREAST BIOPSY  03/21/11  . BREAST LUMPECTOMY  04/19/11   snbx  . BREAST LUMPECTOMY  05/10/11   Left Breast Lumpectomy re-excision  . BREAST REDUCTION SURGERY  02/15/2012   Procedure: MAMMARY REDUCTION  (BREAST);  Surgeon: Theodoro Kos, DO;  Location: [facility];  Service: Plastics;  Laterality: Right;  RIGHT BREAST MASTOPEXY  . [place], 2005   x 2  . COLONOSCOPY    . CYSTOSCOPY N/A 03/20/2018   Procedure: CYSTOSCOPY;  Surgeon: Paula Compton, MD;  Location: [facility];  Service: Gynecology;  Laterality: N/A;  . DILATION AND CURETTAGE OF UTERUS     X 1  . LAPAROSCOPIC GASTRIC BANDING  01/27/2009  . REDUCTION MAMMAPLASTY Right   . TUBAL LIGATION      FAMILY HISTORY Family History  Problem Relation Age of Onset  . Stroke Mother        Stroke - mini  . Cancer Father        Lymphoma - passed age 67  . Skin cancer Father   . Skin cancer Sister   . Uterine cancer Sister 23  The patient's father died at the age of 40 from sepsis in the setting of lymphoma. The patient's mother died at the age of 43 after a heart attack in the setting of multiple strokes. The patient had no brothers. She has 3 sisters. There is no history of breast or ovarian cancer in the family   GYNECOLOGIC HISTORY: Menarche age 51, first pregnancy to term age 37. She is GX P2. As noted above, she still having periods through tamoxifen, although they are slightly less frequent and briefer. She is s/p BTL.  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 She  divorced her first husband, with 3 and 55 year old sons at home, with shared custody. Her 4 year old son is currently at college in [employer] film making. She married [name] [name] in 2014. He works as a Medical illustrator. He has 2 daughters from a prior marriage, age 64 and 43. She attends [employer] locally   ADVANCED DIRECTIVES: not in place   HEALTH MAINTENANCE: Social History   Tobacco Use  . Smoking status: Never Smoker  . Smokeless tobacco: Never Used  Vaping Use  . Vaping Use: Never used  Substance Use Topics  . Alcohol use: Yes    Alcohol/week: 2.0 - 3.0 standard drinks    Types: 2 - 3 Glasses of wine per week  . Drug use: No    Colonoscopy:  PAP:  Bone density: 04/25/2018, 0.8 (normal)  Lipid panel:  Allergies  Allergen Reactions  . Prednisolone Hives    Current Outpatient Medications  Medication Sig Dispense Refill  .  anastrozole (ARIMIDEX) 1 MG tablet Take 1 tablet (1 mg total) by mouth daily. 90 tablet 4  . cholecalciferol (VITAMIN D3) 25 MCG (1000 UT) tablet Take 1 tablet (1,000 Units total) by mouth daily.    . citalopram (CELEXA) 40 MG tablet Take 1 tablet (40 mg total) by mouth daily.    . hydrochlorothiazide (HYDRODIURIL) 12.5 MG tablet Take 2 tablets (25 mg total) by mouth daily.    . lansoprazole (PREVACID) 30 MG capsule Take 1 capsule (30 mg total) by mouth daily at 12 noon.     No current facility-administered medications for this visit.    OBJECTIVE:  white woman who appears stated age  109:   09/25/19 0910  BP: (!) 138/96  Pulse: 60  Resp: 17  Temp: 98.7 F (37.1 C)  SpO2: 97%   Wt Readings from Last 3 Encounters:  09/25/19 258 lb 1.6 oz (117.1 kg)  09/06/18 255 lb (115.7 kg)  04/19/18 258 lb 1.6 oz (117.1 kg)   Body mass index is 41.03 kg/m.    ECOG FS:1 - Symptomatic but completely ambulatory  Ocular: Sclerae unicteric, pupils round and equal Ear-nose-throat: Wearing a mask Lymphatic: No  cervical or supraclavicular adenopathy Lungs no rales or rhonchi Heart regular rate and rhythm Abd soft, nontender, positive bowel sounds MSK no focal spinal tenderness, no joint edema Neuro: non-focal, well-oriented, appropriate affect Breasts: The right breast is benign.  The left breast is undergone lumpectomy followed by radiation with no evidence of disease recurrence.  Both axillae are benign.   LAB RESULTS:  Lab Results  Component Value Date   WBC 4.2 09/25/2019   NEUTROABS 2.4 09/25/2019   HGB 13.9 09/25/2019   HCT 42.3 09/25/2019   MCV 88.1 09/25/2019   PLT 224 09/25/2019      Chemistry      Component Value Date/Time   NA 143 09/06/2018 0940   NA 141 11/22/2016 0854   K 4.0 09/06/2018 0940   K 4.0 11/22/2016 0854   CL 102 09/06/2018 0940   CL 105 05/01/2012 1044   CO2 31 09/06/2018 0940   CO2 29 11/22/2016 0854   BUN 9 09/06/2018 0940   BUN 8.8 11/22/2016 0854   CREATININE 0.95 09/06/2018 0940   CREATININE 0.91 11/22/2017 0820   CREATININE 0.8 11/22/2016 0854      Component Value Date/Time   CALCIUM 9.6 09/06/2018 0940   CALCIUM 9.4 11/22/2016 0854   ALKPHOS 26 (L) 09/06/2018 0940   ALKPHOS 17 (L) 11/22/2016 0854   AST 35 09/06/2018 0940   AST 15 11/22/2017 0820   AST 15 11/22/2016 0854   ALT 40 09/06/2018 0940   ALT 12 11/22/2017 0820   ALT 12 11/22/2016 0854   BILITOT 0.4 09/06/2018 0940   BILITOT 0.3 11/22/2017 0820   BILITOT 0.42 11/22/2016 0854       Lab Results  Component Value Date   LABCA2 23 04/05/2011    No components found for: [id number]  No results for input(s): INR in the last 168 hours.  Urinalysis No results found for: COLORURINE    STUDIES: No results found.   ASSESSMENT: 54 y.o.  [place] woman status post Left lumpectomy and sentinel lymph node sampling 04/19/2011 for a T2 N0, stage IIA invasive ductal carcinoma, grade 1, estrogen and progesterone receptor positive at 57% and 97% respectively, HER-2 negative, with an  MIB-1 of 10.  (1) Her Oncotype Dx predicts a distant recurrence rate of 13% if her only systemic treatment is tamoxifen  for 5 years.  (2) completed radiation 07/22/2011  (3) started tamoxifen May 2013--switched to anastrozole 04/19/2018  (a) status post laparoscopic hysterectomy and bilateral salpingo-oophorectomy 03/20/2018 with benign pathology (a benign serous cystadenoma removed)  (b) bone densityy 04/25/2018 shwed T score 0.8 (normal)  (c) anastrozole discontinued June 2020 with side effects  (4) genetics testing offered to patient and rediscussed September 2016: Patient opts against it  (a) tested 01/06/17.  Genetic testing was negative and no deleterious mutations were identified.  Genetic testing did detect a Variant of Unknown Significance in the BRCA1 gene called c.343C>A (p.Pro115Thr). Genes tested include:  ALK, APC, ATM, AXIN2,BAP1,  BARD1, BLM, BMPR1A, BRCA1, BRCA2, BRIP1, CASR, CDC73, CDH1, CDK4, CDKN1B, CDKN1C, CDKN2A (p14ARF), CDKN2A (p16INK4a), CEBPA, CHEK2, CTNNA1, DICER1, DIS3L2, EGFR (c.2369C>T, p.Thr790Met variant only), EPCAM (Deletion/duplication testing only), FH, FLCN, GATA2, GPC3, GREM1 (Promoter region deletion/duplication testing only), HOXB13 (c.251G>A, p.Gly84Glu), HRAS, KIT, MAX, MEN1, MET, MITF (c.952G>A, p.Glu318Lys variant only), MLH1, MSH2, MSH3, MSH6, MUTYH, NBN, NF1, NF2, NTHL1, PALB2, PDGFRA, PHOX2B, PMS2, POLD1, POLE, POT1, PRKAR1A, PTCH1, PTEN, RAD50, RAD51C, RAD51D, RB1, RECQL4, RET, RUNX1, SDHAF2, SDHA (sequence changes only), SDHB, SDHC, SDHD, SMAD4, SMARCA4, SMARCB1, SMARCE1, STK11, SUFU, TERT, TERT, TMEM127, TP53, TSC1, TSC2, VHL, WRN and WT1.   PLAN:   [name] [name] is now a little over 8 years out from definitive surgery for breast cancer with no evidence of disease recurrence.  This is very favorable.  We reviewed her labs which are excellent and her mammogram which shows no significant findings on low breast density.  This is all very favorable.  We  discussed her husband situation which is complex and difficult.  I regret that her experience with behavioral health recon was so negative and encouraged her to document that for them.  If the lower abdominal cramps persist she will let me know and we will obtain a CT of the pelvis.  I would prefer not to expose her to radiation unnecessarily.  Otherwise she will see me again in 1 year.  She knows to call for any other issue that may develop before then  Total encounter time 25 minutes.*  Rick Warnick, Virgie Dad, MD  09/25/19 9:18 AM Medical Oncology and Hematology [facility] [address] [place], [place] [address] Tel. [phone]    Fax. [phone]   I, Jacqualyn Posey am acting as a Education administrator for Chauncey Cruel, MD.   I, Lurline Del MD, have reviewed the above documentation for accuracy and completeness, and I agree with the above.    *Total Encounter Time as defined by the Centers for Medicare and Medicaid Services includes, in addition to the face-to-face time of a patient visit (documented in the note above) non-face-to-face time: obtaining and reviewing outside history, ordering and reviewing medications, tests or procedures, care coordination (communications with other health care professionals or caregivers) and documentation in the medical record.

## 2019-09-25 ENCOUNTER — Inpatient Hospital Stay (HOSPITAL_BASED_OUTPATIENT_CLINIC_OR_DEPARTMENT_OTHER): Payer: 59 | Admitting: Oncology

## 2019-09-25 ENCOUNTER — Other Ambulatory Visit: Payer: Self-pay

## 2019-09-25 ENCOUNTER — Inpatient Hospital Stay: Payer: 59 | Attending: Oncology

## 2019-09-25 VITALS — BP 138/96 | HR 60 | Temp 98.7°F | Resp 17 | Ht 66.5 in | Wt 258.1 lb

## 2019-09-25 DIAGNOSIS — Z90710 Acquired absence of both cervix and uterus: Secondary | ICD-10-CM

## 2019-09-25 DIAGNOSIS — Z6841 Body Mass Index (BMI) 40.0 and over, adult: Secondary | ICD-10-CM

## 2019-09-25 DIAGNOSIS — C50412 Malignant neoplasm of upper-outer quadrant of left female breast: Secondary | ICD-10-CM | POA: Diagnosis not present

## 2019-09-25 DIAGNOSIS — R1030 Lower abdominal pain, unspecified: Secondary | ICD-10-CM | POA: Insufficient documentation

## 2019-09-25 DIAGNOSIS — Z9884 Bariatric surgery status: Secondary | ICD-10-CM | POA: Diagnosis not present

## 2019-09-25 DIAGNOSIS — R197 Diarrhea, unspecified: Secondary | ICD-10-CM | POA: Diagnosis not present

## 2019-09-25 DIAGNOSIS — Z853 Personal history of malignant neoplasm of breast: Secondary | ICD-10-CM | POA: Insufficient documentation

## 2019-09-25 DIAGNOSIS — Z923 Personal history of irradiation: Secondary | ICD-10-CM | POA: Diagnosis not present

## 2019-09-25 DIAGNOSIS — Z90721 Acquired absence of ovaries, unilateral: Secondary | ICD-10-CM

## 2019-09-25 DIAGNOSIS — Z170 Estrogen receptor positive status [ER+]: Secondary | ICD-10-CM

## 2019-09-25 LAB — CBC WITH DIFFERENTIAL/PLATELET
Abs Immature Granulocytes: 0 10*3/uL (ref 0.00–0.07)
Basophils Absolute: 0 10*3/uL (ref 0.0–0.1)
Basophils Relative: 1 %
Eosinophils Absolute: 0.1 10*3/uL (ref 0.0–0.5)
Eosinophils Relative: 3 %
HCT: 42.3 % (ref 36.0–46.0)
Hemoglobin: 13.9 g/dL (ref 12.0–15.0)
Immature Granulocytes: 0 %
Lymphocytes Relative: 33 %
Lymphs Abs: 1.4 10*3/uL (ref 0.7–4.0)
MCH: 29 pg (ref 26.0–34.0)
MCHC: 32.9 g/dL (ref 30.0–36.0)
MCV: 88.1 fL (ref 80.0–100.0)
Monocytes Absolute: 0.4 10*3/uL (ref 0.1–1.0)
Monocytes Relative: 8 %
Neutro Abs: 2.4 10*3/uL (ref 1.7–7.7)
Neutrophils Relative %: 55 %
Platelets: 224 10*3/uL (ref 150–400)
RBC: 4.8 MIL/uL (ref 3.87–5.11)
RDW: 12.2 % (ref 11.5–15.5)
WBC: 4.2 10*3/uL (ref 4.0–10.5)
nRBC: 0 % (ref 0.0–0.2)

## 2019-09-25 LAB — COMPREHENSIVE METABOLIC PANEL
ALT: 35 U/L (ref 0–44)
AST: 30 U/L (ref 15–41)
Albumin: 3.8 g/dL (ref 3.5–5.0)
Alkaline Phosphatase: 31 U/L — ABNORMAL LOW (ref 38–126)
Anion gap: 9 (ref 5–15)
BUN: 10 mg/dL (ref 6–20)
CO2: 29 mmol/L (ref 22–32)
Calcium: 9.5 mg/dL (ref 8.9–10.3)
Chloride: 102 mmol/L (ref 98–111)
Creatinine, Ser: 0.94 mg/dL (ref 0.44–1.00)
GFR calc Af Amer: >60 mL/min (ref >60)
GFR calc non Af Amer: >60 mL/min (ref >60)
Glucose, Bld: 91 mg/dL (ref 70–99)
Potassium: 4.1 mmol/L (ref 3.5–5.1)
Sodium: 140 mmol/L (ref 135–145)
Total Bilirubin: 0.4 mg/dL (ref 0.3–1.2)
Total Protein: 7.6 g/dL (ref 6.5–8.1)

## 2019-12-13 ENCOUNTER — Other Ambulatory Visit: Payer: Self-pay | Admitting: Surgery

## 2019-12-13 DIAGNOSIS — R112 Nausea with vomiting, unspecified: Secondary | ICD-10-CM

## 2019-12-25 ENCOUNTER — Ambulatory Visit
Admission: RE | Admit: 2019-12-25 | Discharge: 2019-12-25 | Disposition: A | Discharge status: Home / Self Care | Payer: 59 | Source: Ambulatory Visit | Attending: Surgery | Admitting: Surgery

## 2019-12-25 ENCOUNTER — Other Ambulatory Visit: Payer: Self-pay | Admitting: Surgery

## 2019-12-25 DIAGNOSIS — R112 Nausea with vomiting, unspecified: Secondary | ICD-10-CM

## 2020-01-31 ENCOUNTER — Encounter (INDEPENDENT_AMBULATORY_CARE_PROVIDER_SITE_OTHER): Payer: Self-pay

## 2020-02-09 NOTE — Progress Notes (Signed)
Cardiology Office Note:    Date:  02/12/2020   ID:  [name] [name], DOB 1965-05-28, MRN [id number]  PCP:  Kathyrn Lass, MD  Cardiologist:  No primary care provider on file.  Electrophysiologist:  None   Referring MD: Kathyrn Lass, MD   Chief Complaint  Patient presents with  . Chest Pain    History of Present Illness:    [name] [name] is a 54 y.o. female with a hx of breast cancer, hypertension, obesity status post lap band who is referred by Dr. Sabra Heck for evaluation of chest pain.  She reports she underwent radiation treatment for breast cancer in 2013.  She had lap band in 2010, lost about 110 pounds, but has gained about 70 lbs back.  She is on hydrochlorothiazide for hypertension, reports BP usually 120s over 80s.  Reports chest pain started a month ago.  Describes tightness in the center of her chest that radiates to her left shoulder/back.  Occurs about once per month.  Episodes can last from 2 to 8 minutes.  She has not noticed what brings it on.  She walks 1 day/week for 15 to 30 minutes.  Does report that when she walks fast she will have chest pain.  She denies any dyspnea.  Reports occasional lightheadedness with standing.  Denies any syncope.  No lower extremity edema.  Reports rare palpitations, occurs about twice per year where she feels like her heart is racing, and can last for 15 minutes.  Reports she does have pain in her legs when she walks.  No smoking history.  Family history includes mother died of MI in 69s, had rheumatic fever and underwent valve replacement in 57s (she is unclear of details).  Father died of CVA in 58s.   Wt Readings from Last 3 Encounters:  02/10/20 276 lb 9.6 oz (125.5 kg)  09/25/19 258 lb 1.6 oz (117.1 kg)  09/06/18 255 lb (115.7 kg)     Past Medical History:  Diagnosis Date  . Anemia    not at present  . Arthritis    Osteoarthritis Neck and Back  . Breast cancer ([place])   . Cancer [facility]) 2013   Left breast cancer  .  Depression   . DJD (degenerative joint disease)    Cervical and lumbar spin  . Family history of uterine cancer    sister  . GERD (gastroesophageal reflux disease)   . Hx of seizure disorder    Patient denies this dx - pt states "never had a seizure"  . Hypertension    Hx prior to lap band surgery 10 yrs ago, lost weight, no BP issues since surgery, no medication  . Personal history of radiation therapy    2017 left breast  . S/P radiation therapy 06/13/2011 - 07/22/11   Left Breast/ 50 Gy in 25 Fractions: Left Breast Boost Boost/ 10 Gy in 5 Fractions  . Seasonal allergies   . 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 ER+, PR+, Ki-67 10%, Her2- No Amplification    Past Surgical History:  Procedure Laterality Date  . BIOPSY BREAST  08/07/09   Breast, Right Needle core Biopsy, UOQ - Pseudoangiomatous Stromal hyperplasia  . BREAST BIOPSY  03/21/11  . BREAST LUMPECTOMY  04/19/11   snbx  . BREAST LUMPECTOMY  05/10/11   Left Breast Lumpectomy re-excision  . BREAST REDUCTION SURGERY  02/15/2012   Procedure: MAMMARY REDUCTION  (  BREAST);  Surgeon: Theodoro Kos, DO;  Location: [facility];  Service: Plastics;  Laterality: Right;  RIGHT BREAST MASTOPEXY  . [place], 2005   x 2  . COLONOSCOPY    . CYSTOSCOPY N/A 03/20/2018   Procedure: CYSTOSCOPY;  Surgeon: Paula Compton, MD;  Location: [facility];  Service: Gynecology;  Laterality: N/A;  . DILATION AND CURETTAGE OF UTERUS     X 1  . LAPAROSCOPIC GASTRIC BANDING  01/27/2009  . REDUCTION MAMMAPLASTY Right   . TUBAL LIGATION      Current Medications: Current Meds  Medication Sig  . cholecalciferol (VITAMIN D3) 25 MCG (1000 UT) tablet Take 1 tablet (1,000 Units total) by mouth daily.  . citalopram (CELEXA) 40 MG tablet Take 1 tablet (40 mg total) by mouth daily.  . ferrous sulfate 325 (65 FE) MG EC tablet Take 325 mg by  mouth daily with breakfast.  . hydrochlorothiazide (HYDRODIURIL) 25 MG tablet Take 1 tablet (25 mg total) by mouth daily.  . lansoprazole (PREVACID) 30 MG capsule Take 1 capsule (30 mg total) by mouth daily at 12 noon.  . Misc Natural Products (GLUCOSAMINE CHOND CMP ADVANCED) TABS Take by mouth.  . Multiple Vitamins-Minerals (MULTIVITAMIN GUMMIES WOMENS) CHEW Chew 1 tablet by mouth daily.  . Omega-3 Fatty Acids (FISH OIL) 500 MG CAPS Take by mouth.     Allergies:   Prednisolone   Social History   Socioeconomic History  . Marital status: Married    Spouse name: Not on file  . Number of children: Not on file  . Years of education: Not on file  . Highest education level: Not on file  Occupational History  . Not on file  Tobacco Use  . Smoking status: Never Smoker  . Smokeless tobacco: Never Used  Vaping Use  . Vaping Use: Never used  Substance and Sexual Activity  . Alcohol use: Yes    Alcohol/week: 2.0 - 3.0 standard drinks    Types: 2 - 3 Glasses of wine per week  . Drug use: No  . Sexual activity: Not on file    Comment: Menarche age [age], G43, P2, 1 miscarriage, Parity 26 or 52, no BC  Other Topics Concern  . Not on file  Social History Narrative   Menarche age [age], G5, P46, 1 miscarriage, Parity 56 or 69, no BC   Social Determinants of Radio broadcast assistant Strain:   . Difficulty of Paying Living Expenses: Not on file  Food Insecurity:   . Worried About Charity fundraiser in the Last Year: Not on file  . Ran Out of Food in the Last Year: Not on file  Transportation Needs:   . Lack of Transportation (Medical): Not on file  . Lack of Transportation (Non-Medical): Not on file  Physical Activity:   . Days of Exercise per Week: Not on file  . Minutes of Exercise per Session: Not on file  Stress:   . Feeling of Stress : Not on file  Social Connections:   . Frequency of Communication with Friends and Family: Not on file  . Frequency of Social Gatherings with  Friends and Family: Not on file  . Attends Religious Services: Not on file  . Active Member of Clubs or Organizations: Not on file  . Attends Archivist Meetings: Not on file  . Marital Status: Not on file     Family History: The patient's family history includes Cancer in her father; Skin  cancer in her father and sister; Stroke in her mother; Uterine cancer (age of onset: 23) in her sister.  ROS:   Please see the history of present illness.     All other systems reviewed and are negative.  EKGs/Labs/Other Studies Reviewed:    The following studies were reviewed today:   EKG:  EKG is ordered today.  The ekg ordered today demonstrates normal sinus rhythm, rate 59, nonspecific T wave flattening  Recent Labs: 09/25/2019: ALT 35; BUN 10; Creatinine, Ser 0.94; Hemoglobin 13.9; Platelets 224; Potassium 4.1; Sodium 140  Recent Lipid Panel No results found for: CHOL, TRIG, HDL, CHOLHDL, VLDL, LDLCALC, LDLDIRECT  Physical Exam:    VS:  BP (!) 158/86   Pulse (!) 59   Ht '5\' 7"'  (1.702 m)   Wt 276 lb 9.6 oz (125.5 kg)   LMP 03/11/2018 (Exact Date)   BMI 43.32 kg/m     Wt Readings from Last 3 Encounters:  02/10/20 276 lb 9.6 oz (125.5 kg)  09/25/19 258 lb 1.6 oz (117.1 kg)  09/06/18 255 lb (115.7 kg)     GEN:  Well nourished, well developed in no acute distress all chest pain breast cancer treated with: Normal NECK: No JVD; No carotid bruits LYMPHATICS: No lymphadenopathy CARDIAC: RRR, no murmurs, rubs, gallops RESPIRATORY:  Clear to auscultation without rales, wheezing or rhonchi  ABDOMEN: Soft, non-tender, non-distended MUSCULOSKELETAL:  No edema; No deformity  SKIN: Warm and dry NEUROLOGIC:  Alert and oriented x 3 PSYCHIATRIC:  Normal affect   ASSESSMENT:    1. Chest pain of uncertain etiology   2. Essential hypertension   3. Pain in both lower extremities   4. Snoring    PLAN:    Chest pain: Atypical in description but can occur with exertion.  She does  have CAD risk factors (hypertension, obesity, radiation to chest).   - Recommend coronary CTA - Echocardiogram - As needed sublingual nitroglycerin  Hypertension: Continue HCTZ 25 mg daily.  BP elevated in clinic today, though she reports has been under good control at home.  Asked to monitor BP twice daily for next week and call with results  Leg pain: Check ABIs to evaluate for PAD  Snoring: will check sleep study  RTC in 3 months   Medication Adjustments/Labs and Tests Ordered: Current medicines are reviewed at length with the patient today.  Concerns regarding medicines are outlined above.  Orders Placed This Encounter  Procedures  . CT CORONARY MORPH W/CTA COR W/SCORE W/CA W/CM &/OR WO/CM  . CT CORONARY FRACTIONAL FLOW RESERVE DATA PREP  . CT CORONARY FRACTIONAL FLOW RESERVE FLUID ANALYSIS  . EKG 12-Lead  . ECHOCARDIOGRAM COMPLETE  . Split night study  . VAS [place] ABI WITH/WO TBI  . VAS [place] LOWER EXTREMITY ARTERIAL DUPLEX   Meds ordered this encounter  Medications  . metoprolol tartrate (LOPRESSOR) 25 MG tablet    Sig: Take 25 mg (1 tablet) TWO hours prior to CT    Dispense:  1 tablet    Refill:  0  . nitroGLYCERIN (NITROSTAT) 0.4 MG SL tablet    Sig: Place 1 tablet (0.4 mg total) under the tongue every 5 (five) minutes as needed.    Dispense:  25 tablet    Refill:  3    Patient Instructions  Medication Instructions:  Take sublingual nitroglycerin AS NEEDED for chest pain  Testing/Procedures: Coronary CTA-see instructions below  Your physician has requested that you have an echocardiogram. Echocardiography is a painless test that  uses sound waves to create images of your heart. It provides your doctor with information about the size and shape of your heart and how well your heart's chambers and valves are working. This procedure takes approximately one hour. There are no restrictions for this procedure. This will be done at our [facility] location:  [place] has requested that you have an ankle brachial index (ABI). During this test an ultrasound and blood pressure cuff are used to evaluate the arteries that supply the arms and legs with blood. Allow thirty minutes for this exam. There are no restrictions or special instructions.  Your physician has recommended that you have a sleep study. This test records several body functions during sleep, including: brain activity, eye movement, oxygen and carbon dioxide blood levels, heart rate and rhythm, breathing rate and rhythm, the flow of air through your mouth and nose, snoring, body muscle movements, and chest and belly movement.  Follow-Up: At [facility], you and your health needs are our priority.  As part of our continuing mission to provide you with exceptional heart care, we have created designated Provider Care Teams.  These Care Teams include your primary Cardiologist (physician) and Advanced Practice Providers (APPs -  Physician Assistants and Nurse Practitioners) who all work together to provide you with the care you need, when you need it.  We recommend signing up for the patient portal called "MyChart".  Sign up information is provided on this After Visit Summary.  MyChart is used to connect with patients for Virtual Visits (Telemedicine).  Patients are able to view lab/test results, encounter notes, upcoming appointments, etc.  Non-urgent messages can be sent to your provider as well.   To learn more about what you can do with MyChart, go to [email or web address].    Your next appointment:   3 month(s)  The format for your next appointment:   In Person  Provider:   Oswaldo Milian, MD   Other Instructions Please check your blood pressure at home daily, write it down.  Call the office or send message via Mychart with the readings in 2 weeks for Dr. Gardiner Rhyme to review.     CORONARY CTA INSTRUCTIONS:  Your cardiac CT will be scheduled at one  of the below locations:   [facility] [address] [place], [place] [address] (336) [place] [address] [place], [place] [address] [phone]  If scheduled at [facility], please arrive at the [facility] main entrance of [facility] 30 minutes prior to test start time. Proceed to the [facility] Radiology Department (first floor) to check-in and test prep.  If scheduled at [facility], please arrive 15 mins early for check-in and test prep.  Please follow these instructions carefully (unless otherwise directed):  On the Night Before the Test: . Be sure to Drink plenty of water. . Do not consume any caffeinated/decaffeinated beverages or chocolate 12 hours prior to your test. . Do not take any antihistamines 12 hours prior to your test. .  On the Day of the Test: . Drink plenty of water. Do not drink any water within one hour of the test. . Do not eat any food 4 hours prior to the test. . You may take your regular medications prior to the test.  . Take metoprolol (Lopressor) 25 mg two hours prior to test. . HOLD Hydrochlorothiazide morning  of the test. . FEMALES- please wear underwire-free bra if available      After the Test: . Drink plenty of water. . After receiving IV contrast, you may experience a mild flushed feeling. This is normal. . On occasion, you may experience a mild rash up to 24 hours after the test. This is not dangerous. If this occurs, you can take Benadryl 25 mg and increase your fluid intake. . If you experience trouble breathing, this can be serious. If it is severe call 911 IMMEDIATELY. If it is mild, please call our office. . If you take any of these medications: Glipizide/Metformin, Avandament, Glucavance, please do not take 48 hours after completing test unless otherwise instructed.   Once we have confirmed authorization from  your insurance company, we will call you to set up a date and time for your test. Based on how quickly your insurance processes prior authorizations requests, please allow up to 4 weeks to be contacted for scheduling your Cardiac CT appointment. Be advised that routine Cardiac CT appointments could be scheduled as many as 8 weeks after your provider has ordered it.  For non-scheduling related questions, please contact the cardiac imaging nurse navigator should you have any questions/concerns: Marchia Bond, Cardiac Imaging Nurse Navigator Burley Saver, Interim Cardiac Imaging Nurse [place] and Vascular Services Direct Office Dial: [phone]   For scheduling needs, including cancellations and rescheduling, please call [place], [phone] (temporary number).        Signed, Donato Heinz, MD  02/12/2020 1:14 PM    [facility]

## 2020-02-10 ENCOUNTER — Ambulatory Visit (INDEPENDENT_AMBULATORY_CARE_PROVIDER_SITE_OTHER): Payer: 59 | Admitting: Cardiology

## 2020-02-10 ENCOUNTER — Encounter: Payer: Self-pay | Admitting: Cardiology

## 2020-02-10 ENCOUNTER — Other Ambulatory Visit: Payer: Self-pay

## 2020-02-10 VITALS — BP 158/86 | HR 59 | Ht 67.0 in | Wt 276.6 lb

## 2020-02-10 DIAGNOSIS — I10 Essential (primary) hypertension: Secondary | ICD-10-CM | POA: Diagnosis not present

## 2020-02-10 DIAGNOSIS — M79604 Pain in right leg: Secondary | ICD-10-CM

## 2020-02-10 DIAGNOSIS — M79605 Pain in left leg: Secondary | ICD-10-CM

## 2020-02-10 DIAGNOSIS — R079 Chest pain, unspecified: Secondary | ICD-10-CM | POA: Diagnosis not present

## 2020-02-10 DIAGNOSIS — R0683 Snoring: Secondary | ICD-10-CM

## 2020-02-10 MED ORDER — METOPROLOL TARTRATE 25 MG PO TABS: ORAL_TABLET | ORAL | 0 refills | Status: DC

## 2020-02-10 MED ORDER — NITROGLYCERIN 0.4 MG SL SUBL: 0.4000 mg | SUBLINGUAL_TABLET | SUBLINGUAL | 3 refills | Status: DC | PRN

## 2020-02-10 NOTE — Patient Instructions (Signed)
Medication Instructions:  Take sublingual nitroglycerin AS NEEDED for chest pain  Testing/Procedures: Coronary CTA-see instructions below  Your physician has requested that you have an echocardiogram. Echocardiography is a painless test that uses sound waves to create images of your heart. It provides your doctor with information about the size and shape of your heart and how well your heart's chambers and valves are working. This procedure takes approximately one hour. There are no restrictions for this procedure. This will be done at our [facility] location:  [place] has requested that you have an ankle brachial index (ABI). During this test an ultrasound and blood pressure cuff are used to evaluate the arteries that supply the arms and legs with blood. Allow thirty minutes for this exam. There are no restrictions or special instructions.  Your physician has recommended that you have a sleep study. This test records several body functions during sleep, including: brain activity, eye movement, oxygen and carbon dioxide blood levels, heart rate and rhythm, breathing rate and rhythm, the flow of air through your mouth and nose, snoring, body muscle movements, and chest and belly movement.  Follow-Up: At [facility], you and your health needs are our priority.  As part of our continuing mission to provide you with exceptional heart care, we have created designated Provider Care Teams.  These Care Teams include your primary Cardiologist (physician) and Advanced Practice Providers (APPs -  Physician Assistants and Nurse Practitioners) who all work together to provide you with the care you need, when you need it.  We recommend signing up for the patient portal called "MyChart".  Sign up information is provided on this After Visit Summary.  MyChart is used to connect with patients for Virtual Visits (Telemedicine).  Patients are able to view lab/test results,  encounter notes, upcoming appointments, etc.  Non-urgent messages can be sent to your provider as well.   To learn more about what you can do with MyChart, go to [email or web address].    Your next appointment:   3 month(s)  The format for your next appointment:   In Person  Provider:   Oswaldo Milian, MD   Other Instructions Please check your blood pressure at home daily, write it down.  Call the office or send message via Mychart with the readings in 2 weeks for Dr. Gardiner Rhyme to review.     CORONARY CTA INSTRUCTIONS:  Your cardiac CT will be scheduled at one of the below locations:   [facility] [address] [place], [place] [address] (336) [place] [address] [place], [place] [address] [phone]  If scheduled at [facility], please arrive at the [facility] main entrance of [facility] 30 minutes prior to test start time. Proceed to the [facility] Radiology Department (first floor) to check-in and test prep.  If scheduled at [facility], please arrive 15 mins early for check-in and test prep.  Please follow these instructions carefully (unless otherwise directed):  On the Night Before the Test: . Be sure to Drink plenty of water. . Do not consume any caffeinated/decaffeinated beverages or chocolate 12 hours prior to your test. . Do not take any antihistamines 12 hours prior to your test. .  On the Day of the Test: . Drink plenty of water. Do not drink any water within one hour of the test. . Do not eat any food 4  hours prior to the test. . You may take your regular medications prior to the test.  . Take metoprolol (Lopressor) 25 mg two hours prior to test. . HOLD Hydrochlorothiazide morning of the test. . FEMALES- please wear underwire-free bra if available      After the Test: . Drink plenty of water. . After receiving IV  contrast, you may experience a mild flushed feeling. This is normal. . On occasion, you may experience a mild rash up to 24 hours after the test. This is not dangerous. If this occurs, you can take Benadryl 25 mg and increase your fluid intake. . If you experience trouble breathing, this can be serious. If it is severe call 911 IMMEDIATELY. If it is mild, please call our office. . If you take any of these medications: Glipizide/Metformin, Avandament, Glucavance, please do not take 48 hours after completing test unless otherwise instructed.   Once we have confirmed authorization from your insurance company, we will call you to set up a date and time for your test. Based on how quickly your insurance processes prior authorizations requests, please allow up to 4 weeks to be contacted for scheduling your Cardiac CT appointment. Be advised that routine Cardiac CT appointments could be scheduled as many as 8 weeks after your provider has ordered it.  For non-scheduling related questions, please contact the cardiac imaging nurse navigator should you have any questions/concerns: Marchia Bond, Cardiac Imaging Nurse Navigator Burley Saver, Interim Cardiac Imaging Nurse [place] and Vascular Services Direct Office Dial: [phone]   For scheduling needs, including cancellations and rescheduling, please call [place], [phone] (temporary number).

## 2020-02-13 ENCOUNTER — Telehealth: Payer: Self-pay | Admitting: Cardiology

## 2020-02-13 NOTE — Telephone Encounter (Signed)
PA denied due to medical necessity.  Do you want to change to HST?

## 2020-02-17 ENCOUNTER — Other Ambulatory Visit: Payer: Self-pay | Admitting: *Deleted

## 2020-02-17 DIAGNOSIS — I10 Essential (primary) hypertension: Secondary | ICD-10-CM

## 2020-02-17 DIAGNOSIS — Z79899 Other long term (current) drug therapy: Secondary | ICD-10-CM

## 2020-02-17 DIAGNOSIS — R0683 Snoring: Secondary | ICD-10-CM

## 2020-02-18 NOTE — Telephone Encounter (Signed)
Elevated BP, recommend starting losartan 50 mg daily.  Check BMET in 1 week.  Continue to check BP twice daily and send MyChart message in 2 weeks with measurements.

## 2020-02-19 MED ORDER — LOSARTAN POTASSIUM 50 MG PO TABS: 50.0000 mg | ORAL_TABLET | Freq: Every day | ORAL | 3 refills | Status: DC

## 2020-02-21 NOTE — Telephone Encounter (Signed)
No PA required for HST.  Called patient and told her she is scheduled for Monday, Jan. 10 at 11:30, to expect info packet and gave her the sleep lab number.

## 2020-02-27 ENCOUNTER — Other Ambulatory Visit: Payer: Self-pay

## 2020-02-27 DIAGNOSIS — I10 Essential (primary) hypertension: Secondary | ICD-10-CM

## 2020-02-27 DIAGNOSIS — Z79899 Other long term (current) drug therapy: Secondary | ICD-10-CM

## 2020-02-28 LAB — BASIC METABOLIC PANEL
BUN/Creatinine Ratio: 16 (ref 9–23)
BUN: 12 mg/dL (ref 6–24)
CO2: 27 mmol/L (ref 20–29)
Calcium: 9.5 mg/dL (ref 8.7–10.2)
Chloride: 100 mmol/L (ref 96–106)
Creatinine, Ser: 0.76 mg/dL (ref 0.57–1.00)
GFR calc Af Amer: 102 mL/min/{1.73_m2} (ref >59)
GFR calc non Af Amer: 89 mL/min/{1.73_m2} (ref >59)
Glucose: 83 mg/dL (ref 65–99)
Potassium: 4.8 mmol/L (ref 3.5–5.2)
Sodium: 142 mmol/L (ref 134–144)

## 2020-03-01 NOTE — Telephone Encounter (Signed)
BP improved, no changes at this time

## 2020-03-02 ENCOUNTER — Other Ambulatory Visit: Payer: Self-pay | Admitting: Cardiology

## 2020-03-02 DIAGNOSIS — M79605 Pain in left leg: Secondary | ICD-10-CM

## 2020-03-02 DIAGNOSIS — M79604 Pain in right leg: Secondary | ICD-10-CM

## 2020-03-02 DIAGNOSIS — I739 Peripheral vascular disease, unspecified: Secondary | ICD-10-CM

## 2020-03-04 ENCOUNTER — Other Ambulatory Visit (HOSPITAL_COMMUNITY): Payer: 59

## 2020-03-04 ENCOUNTER — Other Ambulatory Visit: Payer: Self-pay

## 2020-03-04 MED ORDER — LOSARTAN POTASSIUM 50 MG PO TABS: 50.0000 mg | ORAL_TABLET | Freq: Every day | ORAL | 3 refills | Status: DC

## 2020-03-09 ENCOUNTER — Other Ambulatory Visit: Payer: Self-pay

## 2020-03-09 ENCOUNTER — Ambulatory Visit (HOSPITAL_COMMUNITY): Payer: 59 | Attending: Cardiology

## 2020-03-09 DIAGNOSIS — R079 Chest pain, unspecified: Secondary | ICD-10-CM | POA: Diagnosis not present

## 2020-03-09 LAB — ECHOCARDIOGRAM COMPLETE
Area-P 1/2: 3.63 cm2
S' Lateral: 3.6 cm

## 2020-03-11 ENCOUNTER — Other Ambulatory Visit: Payer: Self-pay

## 2020-03-11 ENCOUNTER — Ambulatory Visit (HOSPITAL_COMMUNITY)
Admission: RE | Admit: 2020-03-11 | Discharge: 2020-03-11 | Disposition: A | Discharge status: Home / Self Care | Payer: 59 | Source: Ambulatory Visit | Attending: Cardiovascular Disease | Admitting: Cardiovascular Disease

## 2020-03-11 DIAGNOSIS — M79605 Pain in left leg: Secondary | ICD-10-CM | POA: Diagnosis not present

## 2020-03-11 DIAGNOSIS — M79604 Pain in right leg: Secondary | ICD-10-CM | POA: Diagnosis not present

## 2020-03-11 DIAGNOSIS — I739 Peripheral vascular disease, unspecified: Secondary | ICD-10-CM

## 2020-03-17 ENCOUNTER — Ambulatory Visit (HOSPITAL_COMMUNITY): Payer: 59

## 2020-03-18 ENCOUNTER — Telehealth (HOSPITAL_COMMUNITY): Payer: Self-pay | Admitting: Emergency Medicine

## 2020-03-18 NOTE — Telephone Encounter (Signed)
Reaching out to patient to offer assistance regarding upcoming cardiac imaging study; pt verbalizes understanding of appt date/time, parking situation and where to check in, pre-test NPO status and medications ordered, and verified current allergies; name and call back number provided for further questions should they arise Bertie Mcconathy RN Navigator Cardiac Imaging [place] [facility] [phone] office [phone] cell 

## 2020-03-19 ENCOUNTER — Other Ambulatory Visit: Payer: Self-pay

## 2020-03-19 ENCOUNTER — Ambulatory Visit (INDEPENDENT_AMBULATORY_CARE_PROVIDER_SITE_OTHER): Payer: 59 | Admitting: Family Medicine

## 2020-03-19 ENCOUNTER — Encounter (INDEPENDENT_AMBULATORY_CARE_PROVIDER_SITE_OTHER): Payer: Self-pay | Admitting: Family Medicine

## 2020-03-19 VITALS — BP 156/88 | HR 66 | Temp 98.5°F | Ht 67.0 in | Wt 270.0 lb

## 2020-03-19 DIAGNOSIS — E65 Localized adiposity: Secondary | ICD-10-CM

## 2020-03-19 DIAGNOSIS — F411 Generalized anxiety disorder: Secondary | ICD-10-CM

## 2020-03-19 DIAGNOSIS — R1013 Epigastric pain: Secondary | ICD-10-CM

## 2020-03-19 DIAGNOSIS — Z1331 Encounter for screening for depression: Secondary | ICD-10-CM

## 2020-03-19 DIAGNOSIS — Z9189 Other specified personal risk factors, not elsewhere classified: Secondary | ICD-10-CM | POA: Diagnosis not present

## 2020-03-19 DIAGNOSIS — R7303 Prediabetes: Secondary | ICD-10-CM | POA: Diagnosis not present

## 2020-03-19 DIAGNOSIS — Z853 Personal history of malignant neoplasm of breast: Secondary | ICD-10-CM

## 2020-03-19 DIAGNOSIS — K5909 Other constipation: Secondary | ICD-10-CM

## 2020-03-19 DIAGNOSIS — R0683 Snoring: Secondary | ICD-10-CM

## 2020-03-19 DIAGNOSIS — R0602 Shortness of breath: Secondary | ICD-10-CM | POA: Diagnosis not present

## 2020-03-19 DIAGNOSIS — Z0289 Encounter for other administrative examinations: Secondary | ICD-10-CM

## 2020-03-19 DIAGNOSIS — R5383 Other fatigue: Secondary | ICD-10-CM | POA: Diagnosis not present

## 2020-03-19 DIAGNOSIS — Z6841 Body Mass Index (BMI) 40.0 and over, adult: Secondary | ICD-10-CM

## 2020-03-19 DIAGNOSIS — I10 Essential (primary) hypertension: Secondary | ICD-10-CM | POA: Diagnosis not present

## 2020-03-19 DIAGNOSIS — D649 Anemia, unspecified: Secondary | ICD-10-CM

## 2020-03-19 DIAGNOSIS — Z9884 Bariatric surgery status: Secondary | ICD-10-CM

## 2020-03-19 NOTE — Progress Notes (Signed)
Dear Dr. Hyacinth Meeker,   Thank you for referring [name] [name] to our clinic. The following note includes my evaluation and treatment recommendations.  Chief Complaint:   OBESITY [name] [name] (MR# [id number]) is a 55 y.o. female who presents for evaluation and treatment of obesity and related comorbidities. Current BMI is Body mass index is 42.29 kg/m. [name] [name] has been struggling with her weight for many years and has been unsuccessful in either losing weight, maintaining weight loss, or reaching her healthy weight goal.  [name] [name] is currently in the action stage of change and ready to dedicate time achieving and maintaining a healthier weight. [name] [name] is interested in becoming our patient and working on intensive lifestyle modifications including (but not limited to) diet and exercise for weight loss.  [name] [name] has cardiac imaging tomorrow.  HST next week.  She has a history of trying phentermine and Contrave.  She will see Dr. Denyse Amass in March/April for band follow up.  [name] [name] provided the following food recall today:  She has a problem with reflux, vomiting. Needs slider foods. Tired of protein shakes.  [name] [name]'s habits were reviewed today and are as follows: Her family eats meals together, she thinks her family will eat healthier with her, she struggles with family and or coworkers weight loss sabotage, her desired weight loss is 61 pounds, she has been heavy most of her life, she started gaining weight in kindergarten, her heaviest weight ever was 280 pounds, she craves cheese, chocolate chip cookies, white cake with frosting, and fruits, she snacks frequently in the evenings, she skips breakfast sometimes, she is frequently drinking liquids with calories, she frequently makes poor food choices, she frequently eats larger portions than normal and she struggles with emotional eating.  Depression Screen [name] [name]'s Food and Mood (modified PHQ-9) score was 8.  Depression  screen PHQ 2/9 03/19/2020  Decreased Interest 2  Down, Depressed, Hopeless 1  PHQ - 2 Score 3  Altered sleeping 1  Tired, decreased energy 1  Change in appetite 2  Feeling bad or failure about yourself  1  Trouble concentrating 0  Moving slowly or fidgety/restless 0  Suicidal thoughts 0  PHQ-9 Score 8  Difficult doing work/chores Somewhat difficult   Assessment/Plan:   1. Other fatigue [name] [name] admits to daytime somnolence and denies waking up still tired. Patent has a history of symptoms of daytime fatigue, morning headache and snoring. [name] [name] generally gets 6-10 hours of sleep per night, and states that she has poor quality sleep. Snoring is present. Apneic episodes are not present. Epworth Sleepiness Score is 8.  [name] [name] does feel that her weight is causing her energy to be lower than it should be. Fatigue may be related to obesity, depression or many other causes. Labs will be ordered, and in the meanwhile, [name] [name] will focus on self care including making healthy food choices, increasing physical activity and focusing on stress reduction.  2. SOB (shortness of breath) on exertion [name] [name] notes increasing shortness of breath with exercising and seems to be worsening over time with weight gain. She notes getting out of breath sooner with activity than she used to. This has gotten worse recently. [name] denies shortness of breath at rest or orthopnea.  [name] does feel that she gets out of breath more easily that she used to when she exercises. [name]'s shortness of breath appears to be obesity related and exercise induced. She has agreed to work on weight loss and gradually increase exercise to treat her exercise induced  shortness of breath. Will continue to monitor closely.  3. Visceral obesity Current visceral fat rating: 16. Visceral fat rating should be < 13. Visceral adipose tissue is a hormonally active component of total body fat. This body composition phenotype is  associated with medical disorders such as metabolic syndrome, cardiovascular disease and several malignancies including prostate, breast, and colorectal cancers. Starting goal: Lose 7-10% of starting weight.   4. Essential hypertension Not at goal. Medications: losartan 50 mg daily and HCTZ 25 mg daily.   Plan: Avoid buying foods that are: processed, frozen, or prepackaged to avoid excess salt. We will continue to monitor symptoms as they relate to her weight loss journey.  BP Readings from Last 3 Encounters:  03/19/20 (!) 156/88  02/10/20 (!) 158/86  09/25/19 (!) 138/96   Lab Results  Component Value Date   CREATININE 0.76 02/27/2020   5. History of breast cancer in 2013. She is 7 years out.  Status post tamoxifen until 2020.  Status post TVH, BSO.  6. Prediabetes She will continue to focus on protein-rich, low simple carbohydrate foods. We reviewed the importance of hydration, regular exercise for stress reduction, and restorative sleep.   7. Anemia, unspecified type [name] is taking ferrous sulfate 325 mg daily.  Nutrition: Iron-rich foods include dark leafy greens, red and white meats, eggs, seafood, and beans.  Certain foods and drinks prevent your body from absorbing iron properly. Avoid eating these foods in the same meal as iron-rich foods or with iron supplements. These foods include: coffee, black tea, and red wine; milk, dairy products, and foods that are high in calcium; beans and soybeans; whole grains. Constipation can be a side effect of iron supplementation. Increased water and fiber intake are helpful. Water goal: > 2 liters/day. Fiber goal: > 25 grams/day.  8. Dyspepsia [name] takes Prevacid 30 mg twice daily.  We reviewed the diagnosis of dyspepsia and the reasons why it was important to treat. We discussed "red flag" symptoms and the importance of follow up if symptoms persisted despite treatment. We reviewed non-pharmacologic management of dyspepsia symptoms:  including: caffeine reduction, dietary changes, elevate HOB, NPO after supper, reduction of alcohol intake, tobacco cessation, and weight loss.  9. Other constipation Counseling Getting to Good Bowel Health: Your goal is to have one soft bowel movement each day. Drink at least 8 glasses of water each day. Eat plenty of fiber (goal is over 25 grams each day). It is best to get most of your fiber from dietary sources which includes leafy green vegetables, fresh fruit, and whole grains. You may need to add fiber with the help of OTC fiber supplements. These include Metamucil, Citrucel, and Benefiber. If you are still having trouble, try adding Miralax or Magnesium Citrate. If all of these changes do not work, contact me.  10. History of laparoscopic adjustable gastric banding Ainhoa is at risk for malnutrition due to her previous bariatric surgery.   Counseling  You may need to eat 3 meals and 2 snacks, or 5 small meals each day in order to reach your protein and calorie goals.   Allow at least 15 minutes for each meal so that you can eat mindfully. Listen to your body so that you do not overeat. For most people, your sleeve or pouch will comfortably hold 4-6 ounces.  Eat foods from all food groups. This includes fruits and vegetables, grains, dairy, and meat and other proteins.  Include a protein-rich food at every meal and snack, and eat the  protein food first.   You should be taking a Bariatric Multivitamin as well as calcium.   11. Snores Cindie endorses morning headaches.  Epworth score is 8.  12. GAD (generalized anxiety disorder) Shelbey is taking Celexa 40 mg daily.  Behavior modification techniques were discussed today to help [name] deal with her anxiety.  Orders and follow up as documented in patient record.   74. Depression screening Caylan was screened for depression as part of her new patient workup today.  PHQ-9 is 8.  Kelise had a positive depression screening.  Depression is commonly associated with obesity and often results in emotional eating behaviors. We will monitor this closely and work on CBT to help improve the non-hunger eating patterns. Referral to Psychology may be required if no improvement is seen as she continues in our clinic.  14. At risk for heart disease Due to Millee's current state of health and medical condition(s), she is at a higher risk for heart disease.   This puts the patient at much greater risk to subsequently develop cardiopulmonary conditions that can significantly affect patient's quality of life in a negative manner as well.    At least 9 minutes was spent on counseling [name] about these concerns today. Initial goal is to lose at least 5-10% of starting weight to help reduce these risk factors.  We will continue to reassess these conditions on a fairly regular basis in an attempt to decrease patient's overall morbidity and mortality.  Evidence-based interventions for health behavior change were utilized today including the discussion of self monitoring techniques, problem-solving barriers and SMART goal setting techniques.  Specifically regarding patient's less desirable eating habits and patterns, we employed the technique of small changes when Dezarae has not been able to fully commit to her prudent nutritional plan.  15. Class 3 severe obesity with serious comorbidity and body mass index (BMI) of 40.0 to 44.9 in adult, unspecified obesity type (HCC)  Liz is currently in the action stage of change and her goal is to continue with weight loss efforts. I recommend Malaiah begin the structured treatment plan as follows:  She has agreed to the Category 2 Plan.  Exercise goals: No exercise has been prescribed at this time.  Shareable - MFP.  Behavioral modification strategies: increasing lean protein intake, decreasing simple carbohydrates, increasing vegetables, increasing water intake, decreasing liquid calories,  decreasing alcohol intake, decreasing sodium intake and increasing high fiber foods.  She was informed of the importance of frequent follow-up visits to maximize her success with intensive lifestyle modifications for her multiple health conditions. She was informed we would discuss her lab results at her next visit unless there is a critical issue that needs to be addressed sooner. Analee agreed to keep her next visit at the agreed upon time to discuss these results.  Objective:   Blood pressure (!) 156/88, pulse 66, temperature 98.5 F (36.9 C), temperature source Oral, height 5\' 7"  (1.702 m), weight 270 lb (122.5 kg), last menstrual period 03/11/2018, SpO2 96 %. Body mass index is 42.29 kg/m.  Indirect Calorimeter completed today shows a VO2 of 270 and a REE of 1882.  Her calculated basal metabolic rate is 0000000 thus her basal metabolic rate is worse than expected.  General: Cooperative, alert, well developed, in no acute distress. HEENT: Conjunctivae and lids unremarkable. Cardiovascular: Regular rhythm.  Lungs: Normal work of breathing. Neurologic: No focal deficits.   Lab Results  Component Value Date   CREATININE 0.76 02/27/2020   BUN 12  02/27/2020   NA 142 02/27/2020   K 4.8 02/27/2020   CL 100 02/27/2020   CO2 27 02/27/2020   Lab Results  Component Value Date   ALT 35 09/25/2019   AST 30 09/25/2019   ALKPHOS 31 (L) 09/25/2019   BILITOT 0.4 09/25/2019   Lab Results  Component Value Date   WBC 4.2 09/25/2019   HGB 13.9 09/25/2019   HCT 42.3 09/25/2019   MCV 88.1 09/25/2019   PLT 224 09/25/2019   Attestation Statements:   Reviewed by clinician on day of visit: allergies, medications, problem list, medical history, surgical history, family history, social history, and previous encounter notes.  This is the patient's first visit at [facility]. The patient's NEW PATIENT PACKET was reviewed at length. Included in the packet: current and past health  history, medications, allergies, ROS, gynecologic history (women only), surgical history, family history, social history, weight history, weight loss surgery history (for those that have had weight loss surgery), nutritional evaluation, mood and food questionnaire, PHQ9, Epworth questionnaire, sleep habits questionnaire, patient life and health improvement goals questionnaire. These will all be scanned into the patient's chart under media.   During the visit, I independently reviewed the patient's EKG, bioimpedance scale results, and indirect calorimeter results. I used this information to tailor a meal plan for the patient that will help her to lose weight and will improve her obesity-related conditions going forward. I performed a medically necessary appropriate examination and/or evaluation. I discussed the assessment and treatment plan with the patient. The patient was provided an opportunity to ask questions and all were answered. The patient agreed with the plan and demonstrated an understanding of the instructions. Labs were ordered at this visit and will be reviewed at the next visit unless more critical results need to be addressed immediately. Clinical information was updated and documented in the EMR.   I, Water quality scientist, CMA, am acting as transcriptionist for Briscoe Deutscher, DO  I have reviewed the above documentation for accuracy and completeness, and I agree with the above. Briscoe Deutscher, DO

## 2020-03-20 ENCOUNTER — Ambulatory Visit (HOSPITAL_COMMUNITY)
Admission: RE | Admit: 2020-03-20 | Discharge: 2020-03-20 | Disposition: A | Discharge status: Home / Self Care | Payer: 59 | Source: Ambulatory Visit | Attending: Cardiology | Admitting: Cardiology

## 2020-03-20 ENCOUNTER — Encounter (HOSPITAL_COMMUNITY): Payer: Self-pay

## 2020-03-20 ENCOUNTER — Encounter: Payer: Self-pay | Admitting: *Deleted

## 2020-03-20 DIAGNOSIS — Z006 Encounter for examination for normal comparison and control in clinical research program: Secondary | ICD-10-CM

## 2020-03-20 DIAGNOSIS — R079 Chest pain, unspecified: Secondary | ICD-10-CM

## 2020-03-20 MED ORDER — IOHEXOL 350 MG/ML SOLN
80.0000 mL | Freq: Once | INTRAVENOUS | Status: AC
  Administered 2020-03-20: 80 mL via INTRAVENOUS

## 2020-03-20 MED ORDER — NITROGLYCERIN 0.4 MG SL SUBL
0.8000 mg | SUBLINGUAL_TABLET | Freq: Once | SUBLINGUAL | Status: AC
  Administered 2020-03-20: 0.8 mg via SUBLINGUAL

## 2020-03-20 MED ORDER — NITROGLYCERIN 0.4 MG SL SUBL
SUBLINGUAL_TABLET | SUBLINGUAL | Status: AC
  Filled 2020-03-20: qty 2

## 2020-03-20 NOTE — Research (Signed)
IDENTIFY Informed Consent                  Subject Name:   [name] [name]   Subject met inclusion and exclusion criteria.  The informed consent form, study requirements and expectations were reviewed with the subject and questions and concerns were addressed prior to the signing of the consent form.  The subject verbalized understanding of the trial requirements.  The subject agreed to participate in the IDENTIFY trial and signed the informed consent.  The informed consent was obtained prior to performance of any protocol-specific procedures for the subject.  A copy of the signed informed consent was given to the subject and a copy was placed in the subject's medical record.   [place] Rain [place], Research Assistant  03/20/2020 07:45 a.m.

## 2020-03-23 ENCOUNTER — Other Ambulatory Visit: Payer: Self-pay

## 2020-03-23 ENCOUNTER — Ambulatory Visit (HOSPITAL_BASED_OUTPATIENT_CLINIC_OR_DEPARTMENT_OTHER): Payer: 59 | Attending: Cardiology | Admitting: Cardiovascular Disease

## 2020-03-23 DIAGNOSIS — I10 Essential (primary) hypertension: Secondary | ICD-10-CM | POA: Diagnosis not present

## 2020-03-23 DIAGNOSIS — R0683 Snoring: Secondary | ICD-10-CM | POA: Diagnosis not present

## 2020-03-23 DIAGNOSIS — G4733 Obstructive sleep apnea (adult) (pediatric): Secondary | ICD-10-CM

## 2020-03-25 ENCOUNTER — Encounter (INDEPENDENT_AMBULATORY_CARE_PROVIDER_SITE_OTHER): Payer: Self-pay | Admitting: Family Medicine

## 2020-03-26 ENCOUNTER — Telehealth: Payer: Self-pay | Admitting: Internal Medicine

## 2020-03-26 DIAGNOSIS — K760 Fatty (change of) liver, not elsewhere classified: Secondary | ICD-10-CM

## 2020-03-26 NOTE — Telephone Encounter (Signed)
Pt updated with test results along with MD's recommendations. Pt verbalized understanding and lab order placed.    Donato Heinz, MD  03/22/2020 11:12 PM EST      No blockages in heart arteries. Was noted to have severe hepatic steatosis, recommend checking LFTs.

## 2020-03-26 NOTE — Telephone Encounter (Signed)
Follow Up:     Pt is returning Hayley's call, concerning her CT results.

## 2020-03-28 LAB — HEPATIC FUNCTION PANEL
ALT: 38 IU/L — ABNORMAL HIGH (ref 0–32)
AST: 25 IU/L (ref 0–40)
Albumin: 4.7 g/dL (ref 3.8–4.9)
Alkaline Phosphatase: 32 IU/L — ABNORMAL LOW (ref 44–121)
Bilirubin Total: 0.3 mg/dL (ref 0.0–1.2)
Bilirubin, Direct: <0.1 mg/dL (ref 0.00–0.40)
Total Protein: 7.5 g/dL (ref 6.0–8.5)

## 2020-04-02 ENCOUNTER — Ambulatory Visit (INDEPENDENT_AMBULATORY_CARE_PROVIDER_SITE_OTHER): Payer: 59 | Admitting: Family Medicine

## 2020-04-02 ENCOUNTER — Other Ambulatory Visit: Payer: Self-pay

## 2020-04-02 ENCOUNTER — Encounter (INDEPENDENT_AMBULATORY_CARE_PROVIDER_SITE_OTHER): Payer: Self-pay | Admitting: Family Medicine

## 2020-04-02 VITALS — BP 132/87 | HR 64 | Temp 98.4°F | Ht 67.0 in | Wt 264.0 lb

## 2020-04-02 DIAGNOSIS — Z6841 Body Mass Index (BMI) 40.0 and over, adult: Secondary | ICD-10-CM

## 2020-04-02 DIAGNOSIS — R7303 Prediabetes: Secondary | ICD-10-CM

## 2020-04-02 DIAGNOSIS — Z9884 Bariatric surgery status: Secondary | ICD-10-CM | POA: Diagnosis not present

## 2020-04-02 DIAGNOSIS — I10 Essential (primary) hypertension: Secondary | ICD-10-CM

## 2020-04-02 DIAGNOSIS — F418 Other specified anxiety disorders: Secondary | ICD-10-CM

## 2020-04-02 DIAGNOSIS — Z9189 Other specified personal risk factors, not elsewhere classified: Secondary | ICD-10-CM

## 2020-04-02 MED ORDER — PROPRANOLOL HCL 20 MG/5ML PO SOLN: 20.0000 mg | Freq: Three times a day (TID) | ORAL | 0 refills | Status: DC

## 2020-04-03 ENCOUNTER — Encounter (INDEPENDENT_AMBULATORY_CARE_PROVIDER_SITE_OTHER): Payer: Self-pay | Admitting: Family Medicine

## 2020-04-03 DIAGNOSIS — F418 Other specified anxiety disorders: Secondary | ICD-10-CM

## 2020-04-04 ENCOUNTER — Encounter (HOSPITAL_BASED_OUTPATIENT_CLINIC_OR_DEPARTMENT_OTHER): Payer: Self-pay | Admitting: Cardiovascular Disease

## 2020-04-04 NOTE — Procedures (Signed)
° ° °  Patient Name: [name] [name], [name] [name] Date: 03/23/2020 Gender: Female D.O.B: [birth year] Age (years): 54 Referring Provider: Oswaldo Milian Height (inches): 28 Interpreting Physician: Shelva Majestic MD, ABSM Weight (lbs): 270 RPSGT: Jacolyn Reedy BMI: 42 MRN: [id number] Neck Size: 16.75  CLINICAL INFORMATION Sleep Study Type: HST  Indication for sleep study: N/A  Epworth Sleepiness Score: 9  SLEEP STUDY TECHNIQUE A multi-channel overnight portable sleep study was performed. The channels recorded were: nasal airflow, thoracic respiratory movement, and oxygen saturation with a pulse oximetry. Snoring was also monitored.  MEDICATIONS Bacillus Coagulans-Inulin (PROBIOTIC-PREBIOTIC PO) CALCIUM-VITAMIN D PO citalopram (CELEXA) 40 MG tablet diclofenac (VOLTAREN) 0.1 % ophthalmic solution ferrous sulfate 325 (65 FE) MG EC tablet hydrochlorothiazide (HYDRODIURIL) 25 MG tablet lansoprazole (PREVACID) 30 MG capsule losartan (COZAAR) 50 MG tablet Misc Natural Products (GLUCOSAMINE CHOND CMP ADVANCED) TABS Multiple Vitamins-Minerals (MULTIVITAMIN GUMMIES WOMENS) CHEW nitroGLYCERIN (NITROSTAT) 0.4 MG SL tablet Omega-3 Fatty Acids (FISH OIL) 500 MG CAPS propranolol (INDERAL) 20 MG/5ML solution Patient self administered medications include: N/A.  SLEEP ARCHITECTURE Patient was studied for 512.4 minutes. The sleep efficiency was 100.0 % and the patient was supine for 0%. The arousal index was 0.0 per hour.  RESPIRATORY PARAMETERS The overall AHI was 9.7 per hour, with a central apnea index of 0.0 per hour. Supine sleep was absent.  The oxygen nadir was 86% during sleep.  CARDIAC DATA Mean heart rate during sleep was 59.9 bpm.  IMPRESSIONS - Mild obstructive sleep apnea occurred during this study (AHI 9.7/h); however, supine sleep was not achieved and the severity during REM sleep cannot be assessed on this home study. - No significant central sleep apnea occurred  during this study (CAI = 0.0/h). - Moderate oxygen desaturation to a nadir of 86%. - Patient snored 0.2% during the sleep.  DIAGNOSIS - Obstructive Sleep Apnea (G47.33)  RECOMMENDATIONS - Therapeutic CPAP titration to determine optimal pressure required to alleviate sleep disordered breathing. If unable to perform an in-lab study can initiate an Auto-PAP trial at 6 - 15 cm of water.  - Effort should be made to optimize nasal and oropharyngeal patency. - If patient is against CPAP initiation can consider alternative such as a customized oral appliance. - Avoid alcohol, sedatives and other CNS depressants that may worsen sleep apnea and disrupt normal sleep architecture. - Sleep hygiene [phone]) should be reviewed to assess factors that may improve sleep quality. - Recommend a download and sleep clinic evaluation after initiation of therapy.    [Electronically signed] 04/04/2020 03:45 PM  Shelva Majestic MD, [facility], [place], American Board of Sleep Medicine   NPI: [id number] [place] PH: [phone]   FX: [phone] [place]

## 2020-04-06 NOTE — Telephone Encounter (Signed)
Last OV with Dr Wallace 

## 2020-04-06 NOTE — Progress Notes (Signed)
Chief Complaint:   OBESITY [name] [name] is here to discuss her progress with her obesity treatment plan along with follow-up of her obesity related diagnoses.   Today's visit was #: 2 Starting weight: 270 lbs Starting date: 03/19/2020 Today's weight: 264 lbs Today's date: 04/02/2020 Total lbs lost to date: 6 lbs Body mass index is 41.35 kg/m.  Total weight loss percentage to date: -2.22%  Interim History: [name] [name] says that 1200 calories caused her to feel woozy (see MyChart message).  She changed to 1500-1600 calories and eats every 3-4 hours.  She says the wooziness is gone.  Nutrition Plan: the Category 2 Plan for 90% of the time.  Activity: None at this time.  Assessment/Plan:   1. Essential hypertension At goal. Medications: Cozaar 50 mg daily, HCTZ 25 mg daily.   Plan: Avoid buying foods that are: processed, frozen, or prepackaged to avoid excess salt. We will continue to monitor symptoms as they relate to her weight loss journey.  BP Readings from Last 3 Encounters:  04/02/20 132/87  03/20/20 115/83  03/19/20 (!) 156/88   Lab Results  Component Value Date   CREATININE 0.76 02/27/2020   2. Prediabetes Goal is HgbA1c < 5.7.  She will continue to focus on protein-rich, low simple carbohydrate foods. We reviewed the importance of hydration, regular exercise for stress reduction, and restorative sleep.   3. History of laparoscopic adjustable gastric banding, 01/27/2009 [name] [name] is at risk for malnutrition due to her previous bariatric surgery.   Counseling  You may need to eat 3 meals and 2 snacks, or 5 small meals each day in order to reach your protein and calorie goals.   Allow at least 15 minutes for each meal so that you can eat mindfully. Listen to your body so that you do not overeat. For most people, your sleeve or pouch will comfortably hold 4-6 ounces.  Eat foods from all food groups. This includes fruits and vegetables, grains, dairy, and meat and other  proteins.  Include a protein-rich food at every meal and snack, and eat the protein food first.   You should be taking a Bariatric Multivitamin as well as calcium.   4. Situational anxiety Caregiver for husband, who is disabled due to depression/anxiety.  She will start propranolol 20 mg three times daily as needed for anxiety, as per below.  -Start propranolol (INDERAL) 20 MG/5ML solution; Take 5 mLs (20 mg total) by mouth 3 (three) times daily. PRN  Dispense: 360 mL; Refill: 0  5. At risk for activity intolerance [name] [name] was given approximately 8 minutes of exercise intolerance counseling today. She is 55 y.o. female and has risk factors exercise intolerance including recent issues with presyncope. The patient understands monitoring parameters and red flags.   6. Class 3 severe obesity with serious comorbidity and body mass index (BMI) of 40.0 to 44.9 in adult, unspecified obesity type ([place])  Course: [name] [name] is currently in the action stage of change. As such, her goal is to continue with weight loss efforts.   Nutrition goals: She has agreed to keeping a food journal and adhering to recommended goals of 1500 calories and 95 grams of protein.   Exercise goals: No exercise has been prescribed at this time.  Behavioral modification strategies: increasing lean protein intake, decreasing simple carbohydrates, increasing vegetables, increasing water intake and emotional eating strategies.  [name] [name] has agreed to follow-up with our clinic in 2 weeks. She was informed of the importance of frequent follow-up visits to maximize  her success with intensive lifestyle modifications for her multiple health conditions.   Objective:   Blood pressure 132/87, pulse 64, temperature 98.4 F (36.9 C), temperature source Oral, height 5\' 7"  (1.702 m), weight 264 lb (119.7 kg), last menstrual period 03/11/2018, SpO2 97 %. Body mass index is 41.35 kg/m.  General: Cooperative, alert, well developed, in no  acute distress. HEENT: Conjunctivae and lids unremarkable. Cardiovascular: Regular rhythm.  Lungs: Normal work of breathing. Neurologic: No focal deficits.   Lab Results  Component Value Date   CREATININE 0.76 02/27/2020   BUN 12 02/27/2020   NA 142 02/27/2020   K 4.8 02/27/2020   CL 100 02/27/2020   CO2 27 02/27/2020   Lab Results  Component Value Date   ALT 38 (H) 03/27/2020   AST 25 03/27/2020   ALKPHOS 32 (L) 03/27/2020   BILITOT 0.3 03/27/2020   Lab Results  Component Value Date   WBC 4.2 09/25/2019   HGB 13.9 09/25/2019   HCT 42.3 09/25/2019   MCV 88.1 09/25/2019   PLT 224 09/25/2019   Attestation Statements:   Reviewed by clinician on day of visit: allergies, medications, problem list, medical history, surgical history, family history, social history, and previous encounter notes.  I, Water quality scientist, CMA, am acting as transcriptionist for Briscoe Deutscher, DO  I have reviewed the above documentation for accuracy and completeness, and I agree with the above. Briscoe Deutscher, DO

## 2020-04-07 MED ORDER — PROPRANOLOL HCL 20 MG PO TABS: 20.0000 mg | ORAL_TABLET | Freq: Three times a day (TID) | ORAL | 0 refills | Status: DC | PRN

## 2020-04-13 NOTE — Progress Notes (Signed)
Cardiology Office Note:    Date:  04/14/2020   ID:  [name] [name], DOB February 05, 1966, MRN [id number]  PCP:  Kathyrn Lass, MD  Cardiologist:  No primary care provider on file.  Electrophysiologist:  None   Referring MD: Kathyrn Lass, MD   Chief Complaint  Patient presents with  . Follow-up    Follow-up tightness in legs  . Palpitations    History of Present Illness:    [name] [name] is a 55 y.o. female with a hx of breast cancer, hypertension, obesity status post lap band who presents for follow-up.  She was referred by Dr. Sabra Heck for evaluation of chest pain, initially seen on 02/10/2020.  She reports she underwent radiation treatment for breast cancer in 2013.  She had lap band in 2010, lost about 110 pounds, but has gained about 70 lbs back.  She is on hydrochlorothiazide for hypertension, reports BP usually 120s over 80s.  Reports chest pain started a month ago.  Describes tightness in the center of her chest that radiates to her left shoulder/back.  Occurs about once per month.  Episodes can last from 2 to 8 minutes.  She has not noticed what brings it on.  She walks 1 day/week for 15 to 30 minutes.  Does report that when she walks fast she will have chest pain.  She denies any dyspnea.  Reports occasional lightheadedness with standing.  Denies any syncope.  No lower extremity edema.  Reports rare palpitations, occurs about twice per year where she feels like her heart is racing, and can last for 15 minutes.  Reports she does have pain in her legs when she walks.  No smoking history.  Family history includes mother died of MI in 56s, had rheumatic fever and underwent valve replacement in 33s (she is unclear of details).  Father died of CVA in 78s.  Echocardiogram 03/09/2020 showed normal biventricular function, mild LVH, no significant valvular disease.  Normal ABIs on 03/11/2020.  Coronary CTA on 03/20/2020 showed calcium score 0, nonobstructive CAD with minimal stenosis in  proximal RCA.  Since last clinic visit, she reports no further chest pain.  Started propranolol for anxiety recently and chest pain has been better.  Denies any dyspnea, reports occasional lightheadedness with standing, denies any syncope.  No LE swelling but reports tightness in her legs.  2 weeks ago was standing in the kitchen and felt like heart was racing.  Lasted 10-15 minutes.   Wt Readings from Last 3 Encounters:  04/14/20 273 lb (123.8 kg)  04/02/20 264 lb (119.7 kg)  03/23/20 270 lb (122.5 kg)     Past Medical History:  Diagnosis Date  . Anemia    not at present  . Anxiety   . Arthritis    Osteoarthritis Neck and Back  . Back pain   . Breast cancer ([place])   . Cancer [facility]) 2013   Left breast cancer  . Chest pain   . Depression   . DJD (degenerative joint disease)    Cervical and lumbar spin  . Family history of uterine cancer    sister  . GERD (gastroesophageal reflux disease)   . Hot flashes   . Hx of seizure disorder    Patient denies this dx - pt states "never had a seizure"  . Hypertension    Hx prior to lap band surgery 10 yrs ago, lost weight, no BP issues since surgery, no medication  . Joint pain   . Lower extremity edema   .  Obesity   . Personal history of radiation therapy    2017 left breast  . Prediabetes   . S/P radiation therapy 06/13/2011 - 07/22/11   Left Breast/ 50 Gy in 25 Fractions: Left Breast Boost Boost/ 10 Gy in 5 Fractions  . Seasonal allergies   . 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 ER+, PR+, Ki-67 10%, Her2- No Amplification    Past Surgical History:  Procedure Laterality Date  . ABDOMINAL HYSTERECTOMY    . BIOPSY BREAST  08/07/09   Breast, Right Needle core Biopsy, UOQ - Pseudoangiomatous Stromal hyperplasia  . BREAST BIOPSY  03/21/11  . BREAST LUMPECTOMY  04/19/11   snbx  . BREAST LUMPECTOMY  05/10/11   Left Breast Lumpectomy  re-excision  . BREAST REDUCTION SURGERY  02/15/2012   Procedure: MAMMARY REDUCTION  (BREAST);  Surgeon: Theodoro Kos, DO;  Location: [facility];  Service: Plastics;  Laterality: Right;  RIGHT BREAST MASTOPEXY  . [place], 2005   x 2  . COLONOSCOPY    . CYSTOSCOPY N/A 03/20/2018   Procedure: CYSTOSCOPY;  Surgeon: Paula Compton, MD;  Location: [facility];  Service: Gynecology;  Laterality: N/A;  . DILATION AND CURETTAGE OF UTERUS     X 1  . LAPAROSCOPIC GASTRIC BANDING  01/27/2009  . REDUCTION MAMMAPLASTY Right   . TUBAL LIGATION      Current Medications: Current Meds  Medication Sig  . Bacillus Coagulans-Inulin (PROBIOTIC-PREBIOTIC PO) Take by mouth.  Marland Kitchen CALCIUM-VITAMIN D PO Take by mouth.  . citalopram (CELEXA) 40 MG tablet Take 1 tablet (40 mg total) by mouth daily.  . diclofenac (VOLTAREN) 0.1 % ophthalmic solution   . ferrous sulfate 325 (65 FE) MG EC tablet Take 325 mg by mouth daily with breakfast.  . hydrochlorothiazide (HYDRODIURIL) 25 MG tablet Take 1 tablet (25 mg total) by mouth daily.  . lansoprazole (PREVACID) 30 MG capsule Take 30 mg by mouth 2 (two) times a week.  . losartan (COZAAR) 50 MG tablet Take 1 tablet (50 mg total) by mouth daily.  . Misc Natural Products (GLUCOSAMINE CHOND CMP ADVANCED) TABS Take by mouth.  . Multiple Vitamins-Minerals (MULTIVITAMIN GUMMIES WOMENS) CHEW Chew 1 tablet by mouth daily.  . nitroGLYCERIN (NITROSTAT) 0.4 MG SL tablet Place 1 tablet (0.4 mg total) under the tongue every 5 (five) minutes as needed.  . Omega-3 Fatty Acids (FISH OIL) 500 MG CAPS Take by mouth.  . propranolol (INDERAL) 20 MG tablet Take 1 tablet (20 mg total) by mouth 3 (three) times daily as needed (anxiety).     Allergies:   Prednisolone   Social History   Socioeconomic History  . Marital status: Married    Spouse name: Not on file  . Number of children: Not on file  . Years of education: Not on file  . Highest education level: Not on  file  Occupational History  . Occupation: customer service rep  Tobacco Use  . Smoking status: Never Smoker  . Smokeless tobacco: Never Used  Vaping Use  . Vaping Use: Never used  Substance and Sexual Activity  . Alcohol use: Yes    Alcohol/week: 2.0 - 3.0 standard drinks    Types: 2 - 3 Glasses of wine per week  . Drug use: No  . Sexual activity: Not on file    Comment: Menarche age [age], G57, P2, 1 miscarriage, Parity 10 or 21, no  BC  Other Topics Concern  . Not on file  Social History Narrative   Menarche age [age], G72, P32, 1 miscarriage, Parity 55 or 23, no BC   Social Determinants of Radio broadcast assistant Strain: Not on file  Food Insecurity: Not on file  Transportation Needs: Not on file  Physical Activity: Not on file  Stress: Not on file  Social Connections: Not on file     Family History: The patient's family history includes Anxiety disorder in her mother; Cancer in her father; Heart disease in her mother; Obesity in her mother; Skin cancer in her father and sister; Stroke in her father and mother; Sudden death in her mother; Uterine cancer (age of onset: 25) in her sister.  ROS:   Please see the history of present illness.     All other systems reviewed and are negative.  EKGs/Labs/Other Studies Reviewed:    The following studies were reviewed today:   EKG:  EKG is ordered today.  The ekg ordered today demonstrates normal sinus rhythm, rate 59, nonspecific T wave flattening  Recent Labs: 09/25/2019: Hemoglobin 13.9; Platelets 224 02/27/2020: BUN 12; Creatinine, Ser 0.76; Potassium 4.8; Sodium 142 03/27/2020: ALT 38  Recent Lipid Panel No results found for: CHOL, TRIG, HDL, CHOLHDL, VLDL, LDLCALC, LDLDIRECT  Physical Exam:    VS:  BP 115/80 (BP Location: Left Arm, Patient Position: Sitting)   Pulse (!) 54   Ht $R'5\' 7"'LP$  (1.702 m)   Wt 273 lb (123.8 kg)   LMP 03/11/2018 (Exact Date)   SpO2 98%   BMI 42.76 kg/m     Wt Readings from Last 3  Encounters:  04/14/20 273 lb (123.8 kg)  04/02/20 264 lb (119.7 kg)  03/23/20 270 lb (122.5 kg)     GEN:  Well nourished, well developed in no acute distress  NECK: No JVD; No carotid bruits CARDIAC: RRR, no murmurs, rubs, gallops RESPIRATORY:  Clear to auscultation without rales, wheezing or rhonchi  ABDOMEN: Soft, non-tender, non-distended MUSCULOSKELETAL:  No edema; No deformity  SKIN: Warm and dry NEUROLOGIC:  Alert and oriented x 3 PSYCHIATRIC:  Normal affect   ASSESSMENT:    1. Chest pain of uncertain etiology   2. Palpitations   3. Essential hypertension   4. OSA (obstructive sleep apnea)    PLAN:    Chest pain: Atypical in description but can occur with exertion.  She does have CAD risk factors (hypertension, obesity, radiation to chest).  Echocardiogram 03/09/2020 showed normal biventricular function, mild LVH, no significant valvular disease.  Coronary CTA on 03/20/2020 showed calcium score 0, nonobstructive CAD with minimal stenosis in proximal RCA.  Reports no recent chest pain, no further cardiac work-up recommended  Palpitations: Description concerning for arrhythmia, will evaluate with Zio patch x 2 weeks  Hypertension: Continue HCTZ 25 mg daily., BP appears well controlled  Leg pain: Normal ABIs on 03/11/2020.  OSA: Positive sleep study on 03/23/2020, starting on CPAP  RTC in 1 year   Medication Adjustments/Labs and Tests Ordered: Current medicines are reviewed at length with the patient today.  Concerns regarding medicines are outlined above.  Orders Placed This Encounter  Procedures  . LONG TERM MONITOR (3-14 DAYS)   No orders of the defined types were placed in this encounter.   Patient Instructions  Medication Instructions:  Your physician recommends that you continue on your current medications as directed. Please refer to the Current Medication list given to you today.  *If you need a refill on  your cardiac medications before your next  appointment, please call your pharmacy*  Testing/Procedures:  [place] Monitor Instructions   Your physician has requested you wear your ZIO patch monitor 14 days.   This is a single patch monitor.  [employer] supplies one patch monitor per enrollment.  Additional stickers are not available.   Please do not apply patch if you will be having a Nuclear Stress Test, Echocardiogram, Cardiac CT, MRI, or Chest Xray during the time frame you would be wearing the monitor. The patch cannot be worn during these tests.  You cannot remove and re-apply the ZIO XT patch monitor.   Your ZIO patch monitor will be sent USPS Priority mail from [facility] directly to your home address. The monitor may also be mailed to a PO BOX if home delivery is not available.   It may take 3-5 days to receive your monitor after you have been enrolled.   Once you have received you monitor, please review enclosed instructions.  Your monitor has already been registered assigning a specific monitor serial # to you.   Applying the monitor   Shave hair from upper left chest.   Hold abrader disc by orange tab.  Rub abrader in 40 strokes over left upper chest as indicated in your monitor instructions.   Clean area with 4 enclosed alcohol pads .  Use all pads to assure are is cleaned thoroughly.  Let dry.   Apply patch as indicated in monitor instructions.  Patch will be place under collarbone on left side of chest with arrow pointing upward.   Rub patch adhesive wings for 2 minutes.Remove white label marked "1".  Remove white label marked "2".  Rub patch adhesive wings for 2 additional minutes.   While looking in a mirror, press and release button in center of patch.  A small green light will flash 3-4 times .  This will be your only indicator the monitor has been turned on.     Do not shower for the first 24 hours.  You may shower after the first 24 hours.   Press button if you feel a symptom. You will hear  a small click.  Record Date, Time and Symptom in the Patient Log Book.   When you are ready to remove patch, follow instructions on last 2 pages of Patient Log Book.  Stick patch monitor onto last page of Patient Log Book.   Place Patient Log Book in [place] box.  Use locking tab on box and tape box closed securely.  The [employer] has [employer] on it.  Please place in mailbox as soon as possible.  Your physician should have your test results approximately 7 days after the monitor has been mailed back to [facility].   Call [place] at [phone] if you have questions regarding your ZIO XT patch monitor.  Call them immediately if you see an orange light blinking on your monitor.   If your monitor falls off in less than 4 days contact our Monitor department at [phone].  If your monitor becomes loose or falls off after 4 days call [employer] at [phone] for suggestions on securing your monitor.   Follow-Up: At [facility], you and your health needs are our priority.  As part of our continuing mission to provide you with exceptional heart care, we have created designated Provider Care Teams.  These Care Teams include your primary Cardiologist (physician) and Advanced Practice Providers (APPs -  Physician Assistants and Nurse Practitioners) who all work together to provide you with the care you need, when you need it.  We recommend signing up for the patient portal called "MyChart".  Sign up information is provided on this After Visit Summary.  MyChart is used to connect with patients for Virtual Visits (Telemedicine).  Patients are able to view lab/test results, encounter notes, upcoming appointments, etc.  Non-urgent messages can be sent to your provider as well.   To learn more about what you can do with MyChart, go to [email or web address].    Your next appointment:   12 month(s)  The format for your next appointment:   In Person  Provider:    Oswaldo Milian, MD         Signed, Donato Heinz, MD  04/14/2020 5:35 PM    [place]

## 2020-04-14 ENCOUNTER — Ambulatory Visit (INDEPENDENT_AMBULATORY_CARE_PROVIDER_SITE_OTHER): Payer: 59

## 2020-04-14 ENCOUNTER — Ambulatory Visit (INDEPENDENT_AMBULATORY_CARE_PROVIDER_SITE_OTHER): Payer: 59 | Admitting: Cardiology

## 2020-04-14 ENCOUNTER — Other Ambulatory Visit: Payer: Self-pay

## 2020-04-14 ENCOUNTER — Encounter: Payer: Self-pay | Admitting: Cardiology

## 2020-04-14 ENCOUNTER — Encounter: Payer: Self-pay | Admitting: Radiology

## 2020-04-14 ENCOUNTER — Telehealth: Payer: Self-pay | Admitting: *Deleted

## 2020-04-14 VITALS — BP 115/80 | HR 54 | Ht 67.0 in | Wt 273.0 lb

## 2020-04-14 DIAGNOSIS — G4733 Obstructive sleep apnea (adult) (pediatric): Secondary | ICD-10-CM

## 2020-04-14 DIAGNOSIS — I10 Essential (primary) hypertension: Secondary | ICD-10-CM

## 2020-04-14 DIAGNOSIS — R079 Chest pain, unspecified: Secondary | ICD-10-CM

## 2020-04-14 DIAGNOSIS — R002 Palpitations: Secondary | ICD-10-CM | POA: Diagnosis not present

## 2020-04-14 NOTE — Telephone Encounter (Signed)
Discussed sleep study results and recommendations with patient. She is leaning towards using a oral appliance. She planst to check forst with her dentist to see if he provides this service. she has been given the names and numbers for Dr Ron Parker and Dr Toy Cookey. She wants to know what their protocol is for submitting the claim,  whether it is submitted under dental or medical benefits. She will call me back to let me know how to proceed. She also has appointment here in the office today with Dr Gilman Schmidt and will discuss this with him.

## 2020-04-14 NOTE — Progress Notes (Signed)
Enrolled patient for a 14 day Zio XT  monitor to be mailed to patients home  °

## 2020-04-14 NOTE — Patient Instructions (Signed)
Medication Instructions:  Your physician recommends that you continue on your current medications as directed. Please refer to the Current Medication list given to you today.  *If you need a refill on your cardiac medications before your next appointment, please call your pharmacy*   Testing/Procedures:  ZIO XT- Long Term Monitor Instructions   Your physician has requested you wear your ZIO patch monitor 14 days.   This is a single patch monitor.  [employer] supplies one patch monitor per enrollment.  Additional stickers are not available.   Please do not apply patch if you will be having a Nuclear Stress Test, Echocardiogram, Cardiac CT, MRI, or Chest Xray during the time frame you would be wearing the monitor. The patch cannot be worn during these tests.  You cannot remove and re-apply the ZIO XT patch monitor.   Your ZIO patch monitor will be sent USPS Priority mail from [employer] directly to your home address. The monitor may also be mailed to a PO BOX if home delivery is not available.   It may take 3-5 days to receive your monitor after you have been enrolled.   Once you have received you monitor, please review enclosed instructions.  Your monitor has already been registered assigning a specific monitor serial # to you.   Applying the monitor   Shave hair from upper left chest.   Hold abrader disc by orange tab.  Rub abrader in 40 strokes over left upper chest as indicated in your monitor instructions.   Clean area with 4 enclosed alcohol pads .  Use all pads to assure are is cleaned thoroughly.  Let dry.   Apply patch as indicated in monitor instructions.  Patch will be place under collarbone on left side of chest with arrow pointing upward.   Rub patch adhesive wings for 2 minutes.Remove white label marked "1".  Remove white label marked "2".  Rub patch adhesive wings for 2 additional minutes.   While looking in a mirror, press and release button in center of patch.  A  small green light will flash 3-4 times .  This will be your only indicator the monitor has been turned on.     Do not shower for the first 24 hours.  You may shower after the first 24 hours.   Press button if you feel a symptom. You will hear a small click.  Record Date, Time and Symptom in the Patient Log Book.   When you are ready to remove patch, follow instructions on last 2 pages of Patient Log Book.  Stick patch monitor onto last page of Patient Log Book.   Place Patient Log Book in [place] box.  Use locking tab on box and tape box closed securely.  The Orange and [employer] box has prepaid postage on it.  Please place in mailbox as soon as possible.  Your physician should have your test results approximately 7 days after the monitor has been mailed back to [employer].   Call [employer] Customer Care at [phone] if you have questions regarding your ZIO XT patch monitor.  Call them immediately if you see an orange light blinking on your monitor.   If your monitor falls off in less than 4 days contact our Monitor department at [phone].  If your monitor becomes loose or falls off after 4 days call [employer] at [phone] for suggestions on securing your monitor.     Follow-Up: At [facility], you and your health needs are our priority.    our continuing mission to provide you with exceptional heart care, we have created designated Provider Care Teams.  These Care Teams include your primary Cardiologist (physician) and Advanced Practice Providers (APPs -  Physician Assistants and Nurse Practitioners) who all work together to provide you with the care you need, when you need it.  We recommend signing up for the patient portal called "MyChart".  Sign up information is provided on this After Visit Summary.  MyChart is used to connect with patients for Virtual Visits (Telemedicine).  Patients are able to view lab/test results, encounter notes, upcoming appointments, etc.   Non-urgent messages can be sent to your provider as well.   To learn more about what you can do with MyChart, go to [email or web address].    Your next appointment:   12 month(s)  The format for your next appointment:   In Person  Provider:   Oswaldo Milian, MD

## 2020-04-16 ENCOUNTER — Ambulatory Visit (INDEPENDENT_AMBULATORY_CARE_PROVIDER_SITE_OTHER): Payer: 59 | Admitting: Family Medicine

## 2020-04-16 ENCOUNTER — Other Ambulatory Visit: Payer: Self-pay

## 2020-04-16 ENCOUNTER — Encounter (INDEPENDENT_AMBULATORY_CARE_PROVIDER_SITE_OTHER): Payer: Self-pay | Admitting: Family Medicine

## 2020-04-16 VITALS — BP 124/74 | HR 61 | Temp 97.2°F | Ht 67.0 in | Wt 267.0 lb

## 2020-04-16 DIAGNOSIS — Z6841 Body Mass Index (BMI) 40.0 and over, adult: Secondary | ICD-10-CM | POA: Diagnosis not present

## 2020-04-16 DIAGNOSIS — F418 Other specified anxiety disorders: Secondary | ICD-10-CM

## 2020-04-20 NOTE — Progress Notes (Signed)
Chief Complaint:   OBESITY [name] [name] is here to discuss her progress with her obesity treatment plan along with follow-up of her obesity related diagnoses. [name] [name] is on keeping a food journal and adhering to recommended goals of 1500 calories and 95 grams of protein daily and states she is following her eating plan approximately 100% of the time. [name] [name] states she is doing 0 minutes 0 times per week.  Today's visit was #: 3 Starting weight: 270 lbs Starting date: 03/19/2020 Today's weight: 267 lbs Today's date: 04/16/2020 Total lbs lost to date: 3 Total lbs lost since last in-office visit: 0  Interim History: [name] [name] is status post lap band insertion (2010) and she struggles to eat bread. She is tired of eggs for breakfast. She would like to have her band removed and have gastric bypass. Her insurance will not currently cover this, but she will be getting new insurance soon. Her calories average between 1200 and 1700. Her protein averages at 70 grams per day. She is journaling consistently, but she struggles to meet her protein goal.  Subjective:   1. Situational anxiety [name] [name] ispn propranolol 20 mg TID as needed. It was started at her last office visit for an [place]. She feels it has been helpful. She takes it about 3 times per week. Her husband has noticed a difference in her anxiety level. She denies side effects.  Assessment/Plan:   1. Situational anxiety  [name] [name] will continue propranolol as needed.   2. Class 3 severe obesity with serious comorbidity and body mass index (BMI) of 40.0 to 44.9 in adult, unspecified obesity type (HCC) [name] [name] is currently in the action stage of change. As such, her goal is to continue with weight loss efforts. She has agreed to keeping a food journal and adhering to recommended goals of 1500 calories and 95 grams of protein daily.  Handout was given today: Breakfast Options.  [name] [name] may choose her own foods and use Category 2 as a  guide.   Exercise goals: No exercise has been prescribed at this time.  Behavioral modification strategies: increasing lean protein intake and meal planning and cooking strategies.  [name] [name] has agreed to follow-up with our clinic in 2 weeks.   Objective:   Blood pressure 124/74, pulse 61, temperature (!) 97.2 F (36.2 C), height 5\' 7"  (1.702 m), weight 267 lb (121.1 kg), last menstrual period 03/11/2018, SpO2 97 %. Body mass index is 41.82 kg/m.  General: Cooperative, alert, well developed, in no acute distress. HEENT: Conjunctivae and lids unremarkable. Cardiovascular: Regular rhythm.  Lungs: Normal work of breathing. Neurologic: No focal deficits.   Lab Results  Component Value Date   CREATININE 0.76 02/27/2020   BUN 12 02/27/2020   NA 142 02/27/2020   K 4.8 02/27/2020   CL 100 02/27/2020   CO2 27 02/27/2020   Lab Results  Component Value Date   ALT 38 (H) 03/27/2020   AST 25 03/27/2020   ALKPHOS 32 (L) 03/27/2020   BILITOT 0.3 03/27/2020   No results found for: HGBA1C No results found for: INSULIN No results found for: TSH No results found for: CHOL, HDL, LDLCALC, LDLDIRECT, TRIG, CHOLHDL Lab Results  Component Value Date   WBC 4.2 09/25/2019   HGB 13.9 09/25/2019   HCT 42.3 09/25/2019   MCV 88.1 09/25/2019   PLT 224 09/25/2019   No results found for: IRON, TIBC, FERRITIN  Attestation Statements:   Reviewed by clinician on day of visit: allergies, medications, problem list, medical history,  surgical history, family history, social history, and previous encounter notes.   Wilhemena Durie, am acting as Location manager for [employer], FNP-C.  I have reviewed the above documentation for accuracy and completeness, and I agree with the above. -  Georgianne Fick, FNP

## 2020-04-22 DIAGNOSIS — R002 Palpitations: Secondary | ICD-10-CM

## 2020-04-30 ENCOUNTER — Other Ambulatory Visit: Payer: Self-pay

## 2020-04-30 ENCOUNTER — Encounter (INDEPENDENT_AMBULATORY_CARE_PROVIDER_SITE_OTHER): Payer: Self-pay | Admitting: Family Medicine

## 2020-04-30 ENCOUNTER — Ambulatory Visit (INDEPENDENT_AMBULATORY_CARE_PROVIDER_SITE_OTHER): Payer: 59 | Admitting: Family Medicine

## 2020-04-30 VITALS — BP 103/68 | HR 55 | Temp 98.2°F | Ht 67.0 in | Wt 267.0 lb

## 2020-04-30 DIAGNOSIS — Z9189 Other specified personal risk factors, not elsewhere classified: Secondary | ICD-10-CM | POA: Diagnosis not present

## 2020-04-30 DIAGNOSIS — F411 Generalized anxiety disorder: Secondary | ICD-10-CM

## 2020-04-30 DIAGNOSIS — G478 Other sleep disorders: Secondary | ICD-10-CM | POA: Diagnosis not present

## 2020-04-30 DIAGNOSIS — Z9884 Bariatric surgery status: Secondary | ICD-10-CM | POA: Diagnosis not present

## 2020-04-30 DIAGNOSIS — F418 Other specified anxiety disorders: Secondary | ICD-10-CM

## 2020-04-30 DIAGNOSIS — Z6841 Body Mass Index (BMI) 40.0 and over, adult: Secondary | ICD-10-CM

## 2020-04-30 MED ORDER — TOPIRAMATE 25 MG PO CPSP: 25.0000 mg | ORAL_CAPSULE | Freq: Every day | ORAL | 0 refills | Status: DC

## 2020-04-30 MED ORDER — PROPRANOLOL HCL 20 MG PO TABS: 20.0000 mg | ORAL_TABLET | Freq: Three times a day (TID) | ORAL | 0 refills | Status: DC

## 2020-05-06 NOTE — Progress Notes (Signed)
Chief Complaint:   OBESITY [name] [name] is here to discuss her progress with her obesity treatment plan along with follow-up of her obesity related diagnoses.   Today's visit was #: 4 Starting weight: 270 lbs Starting date: 03/19/2020 Today's weight: 267 lbs Today's date: 04/30/2020 Total lbs lost to date: 3 lbs Body mass index is 41.82 kg/m.  Total weight loss percentage to date: -1.11%  Interim History:  [name] [name] is wearing a Zio patch due to having chest pain since Sunday.  She says her chest pain was mild on the way here.  She will see Dr. Redmond Pulling soon at [facility].  She endorses some night eating.  Current Meal Plan: keeping a food journal and adhering to recommended goals of 1500 calories and 95 grams of protein for 100% of the time.  Current Exercise Plan: None at this time.  Assessment/Plan:   1. History of laparoscopic adjustable gastric banding, 01/27/2009 She has had some vomiting after dinner.  She will see Dr. Redmond Pulling tomorrow.    Counseling  You may need to eat 3 meals and 2 snacks, or 5 small meals each day in order to reach your protein and calorie goals.   Allow at least 15 minutes for each meal so that you can eat mindfully. Listen to your body so that you do not overeat. For most people, your sleeve or pouch will comfortably hold 4-6 ounces.  Eat foods from all food groups. This includes fruits and vegetables, grains, dairy, and meat and other proteins.  Include a protein-rich food at every meal and snack, and eat the protein food first.   You should be taking a Bariatric Multivitamin as well as calcium.   2. Nocturnal sleep-related eating disorder Night Eating Syndrome (NES) is currently included in the "Other Specified Feeding or Eating Disorder" category of the DSM-5.  Counseling . Night Eating Syndrome is characterized by eating after awakening from sleep or by excessive food consumption (usually > 25% of daily calories) after the evening meal. It  is "relapsing and remitting," meaning that it comes and goes. It usually worsens during stressful times in your life. The person is aware and recalls the eating. Night Eating Syndrome usually starts in early adulthood. Men and women are both affected, but women tend to have more severe symptoms.  . Interesting statistics: o The prevalence of NES is 1.5% of the general population, but found in 8.9% of patients that enroll in an obesity medicine program. Approximately 15-20% ALSO have Binge Eating Disorder.  . Treatment for Night Eating Syndrome include: o CBT, SSRIs, Topiramate, Progressive Muscle Relaxation, Phototherapy, and Normalized Eating.  - Start topiramate (TOPAMAX) 25 MG capsule; Take 1 capsule (25 mg total) by mouth daily.  Dispense: 30 capsule; Refill: 0  3. GAD (generalized anxiety disorder) Behavior modification techniques were discussed today to help [name] [name] deal with her anxiety.    - Refill propranolol (INDERAL) 20 MG tablet; Take 1 tablet (20 mg total) by mouth 3 (three) times daily.  Dispense: 270 tablet; Refill: 0  4. At risk for malnutrition [name] [name] was given extensive malnutrition prevention education and counseling today of more than 8 minutes.  Counseled her that malnutrition refers to inappropriate nutrients or not the right balance of nutrients for optimal health.  Discussed with [name] [name] that it is absolutely possible to be malnourished but yet obese.  Risk factors, including but not limited to, inappropriate dietary choices, difficulty with obtaining food due to physical or financial limitations, and  various physical and mental health conditions were reviewed with [name] [name].   5. Class 3 severe obesity with serious comorbidity and body mass index (BMI) of 40.0 to 44.9 in adult, unspecified obesity type ([name] [name])  Course: [name] [name] is currently in the action stage of change. As such, her goal is to continue with weight loss efforts.   Nutrition  goals: She has agreed to keeping a food journal and adhering to recommended goals of 1500-1600 calories and 95 grams of protein.   Exercise goals: No exercise has been prescribed at this time.  Behavioral modification strategies: increasing lean protein intake, decreasing simple carbohydrates, increasing vegetables and increasing water intake.  [name] [name] has agreed to follow-up with our clinic in 2 weeks. She was informed of the importance of frequent follow-up visits to maximize her success with intensive lifestyle modifications for her multiple health conditions.   Objective:   Blood pressure 103/68, pulse (!) 55, temperature 98.2 F (36.8 C), temperature source Oral, height 5\' 7"  (1.702 m), weight 267 lb (121.1 kg), last menstrual period 03/11/2018, SpO2 92 %. Body mass index is 41.82 kg/m.  General: Cooperative, alert, well developed, in no acute distress. HEENT: Conjunctivae and lids unremarkable. Cardiovascular: Regular rhythm.  Lungs: Normal work of breathing. Neurologic: No focal deficits.   Lab Results  Component Value Date   CREATININE 0.76 02/27/2020   BUN 12 02/27/2020   NA 142 02/27/2020   K 4.8 02/27/2020   CL 100 02/27/2020   CO2 27 02/27/2020   Lab Results  Component Value Date   ALT 38 (H) 03/27/2020   AST 25 03/27/2020   ALKPHOS 32 (L) 03/27/2020   BILITOT 0.3 03/27/2020   Lab Results  Component Value Date   WBC 4.2 09/25/2019   HGB 13.9 09/25/2019   HCT 42.3 09/25/2019   MCV 88.1 09/25/2019   PLT 224 09/25/2019   Attestation Statements:   Reviewed by clinician on day of visit: allergies, medications, problem list, medical history, surgical history, family history, social history, and previous encounter notes.  I, Water quality scientist, CMA, am acting as transcriptionist for Briscoe Deutscher, DO  I have reviewed the above documentation for accuracy and completeness, and I agree with the above. Briscoe Deutscher, DO

## 2020-05-12 ENCOUNTER — Ambulatory Visit: Payer: 59 | Admitting: Cardiology

## 2020-05-14 ENCOUNTER — Other Ambulatory Visit: Payer: Self-pay

## 2020-05-14 ENCOUNTER — Encounter (INDEPENDENT_AMBULATORY_CARE_PROVIDER_SITE_OTHER): Payer: Self-pay | Admitting: Family Medicine

## 2020-05-14 ENCOUNTER — Ambulatory Visit (INDEPENDENT_AMBULATORY_CARE_PROVIDER_SITE_OTHER): Payer: BC Managed Care – PPO | Admitting: Family Medicine

## 2020-05-14 VITALS — BP 137/79 | HR 63 | Temp 97.5°F | Ht 67.0 in | Wt 266.0 lb

## 2020-05-14 DIAGNOSIS — R632 Polyphagia: Secondary | ICD-10-CM

## 2020-05-14 DIAGNOSIS — Z9189 Other specified personal risk factors, not elsewhere classified: Secondary | ICD-10-CM | POA: Diagnosis not present

## 2020-05-14 DIAGNOSIS — F411 Generalized anxiety disorder: Secondary | ICD-10-CM

## 2020-05-14 DIAGNOSIS — G478 Other sleep disorders: Secondary | ICD-10-CM

## 2020-05-14 DIAGNOSIS — Z6841 Body Mass Index (BMI) 40.0 and over, adult: Secondary | ICD-10-CM

## 2020-05-14 MED ORDER — WEGOVY 0.25 MG/0.5ML ~~LOC~~ SOAJ: 0.2500 mg | SUBCUTANEOUS | 0 refills | Status: DC

## 2020-05-14 MED ORDER — TOPIRAMATE 50 MG PO TABS: 50.0000 mg | ORAL_TABLET | Freq: Every day | ORAL | 0 refills | Status: DC

## 2020-05-18 DIAGNOSIS — G478 Other sleep disorders: Secondary | ICD-10-CM | POA: Insufficient documentation

## 2020-05-18 DIAGNOSIS — F411 Generalized anxiety disorder: Secondary | ICD-10-CM | POA: Insufficient documentation

## 2020-05-18 NOTE — Progress Notes (Signed)
Chief Complaint:   OBESITY [name] [name] is here to discuss her progress with her obesity treatment plan along with follow-up of her obesity related diagnoses. [name] [name] is on keeping a food journal and adhering to recommended goals of 1500-1600 calories and 95 grams of protein and states she is following her eating plan approximately 90% of the time. [name] [name] states she is not exercising regularly at this time.  Today's visit was #: 5 Starting weight: 270 lbs Starting date: 03/19/2020 Today's weight: 266 lbs Today's date: 05/14/2020 Total lbs lost to date: 4 lbs Total lbs lost since last in-office visit: 1 lb  Interim History: [name] [name] feels like she "has the weight of the world on her shoulders".  She endures a great deal of stress at work and home.  Her husband is disabled from depression and does not contribute to chores/cooking at home.  Se notes that meal prep is hard due to lack of time.   She saw Dr. Redmond Pulling on 05/01/2020, and no fluid was added or removed form her Lap Band.  She joined a [employer] that will be opening soon.  Subjective:   1. Nocturnal sleep-related eating disorder She feels the Topamax has helped some with night eating, but she still struggles with this.  2. Polyphagia Notes hunger, especially at night.  No history of pancreatitis, cholelithiasis or personal/family history of thyroid cancer.  3. GAD (generalized anxiety disorder) Notes periods anxiety in which she almost feels she is having a panic attack.  Recent Zio Patch revealed runs of SVT.  Results have not been communicated with her from Cardiology as of yet.  She has been taking propranolol as needed but plans to start taking it daily because it is beneficial.  4. At risk for depression [name] [name] is at elevated risk of depression due to one or more of the following: Excessive stress and poor sleep.   Assessment/Plan:   1. Nocturnal sleep-related eating disorder Increase dose of Topamax to 50 mg daily.   New prescription sent to pharmacy.  She is open to referral to Dr. Mallie Mussel.  She will check cost/co-pay.  - Increase topiramate (TOPAMAX) to 50 MG tablet; Take 1 tablet (50 mg total) by mouth daily.  Dispense: 30 tablet; Refill: 0  2. Polyphagia New prescription sent to pharmacy for Wegovy 0.25 mg subcutaneously weekly.  - Start Semaglutide-Weight Management (WEGOVY) 0.25 MG/0.5ML SOAJ; Inject 0.25 mg into the skin once a week.  Dispense: 2 mL; Refill: 0  3. GAD (generalized anxiety disorder) Take propranolol daily.  Check on cost of [place] visits and then we will refer to Dr. Mallie Mussel if affordable.  4. At risk for depression [name] [name] was given approximately 15 minutes of depression risk counseling today.  We discussed the importance of a healthy work life balance, a healthy relationship with food and a good support system.  Repetitive spaced learning was employed today to elicit superior memory formation and behavioral change.  5. Class 3 severe obesity with serious comorbidity and body mass index (BMI) of 40.0 to 44.9 in adult, unspecified obesity type (HCC)  [name] [name] is currently in the action stage of change. As such, her goal is to continue with weight loss efforts. She has agreed to keeping a food journal and adhering to recommended goals of 1500-1600 calories and 95 grams of protein.   Exercise goals: No exercise has been prescribed at this time.  Behavioral modification strategies: emotional eating strategies and planning for success.  [name] [name] has agreed to follow-up with  our clinic in 3 weeks with Dr. Juleen China and 5-6 weeks with Jake Bathe, [place]. She was informed of the importance of frequent follow-up visits to maximize her success with intensive lifestyle modifications for her multiple health conditions.   Objective:   Blood pressure 137/79, pulse 63, temperature (!) 97.5 F (36.4 C), height 5\' 7"  (1.702 m), weight 266 lb (120.7 kg), last menstrual period 03/11/2018, SpO2 97  %. Body mass index is 41.66 kg/m.  General: Cooperative, alert, well developed, in no acute distress. HEENT: Conjunctivae and lids unremarkable. Cardiovascular: Regular rhythm.  Lungs: Normal work of breathing. Neurologic: No focal deficits.   Lab Results  Component Value Date   CREATININE 0.76 02/27/2020   BUN 12 02/27/2020   NA 142 02/27/2020   K 4.8 02/27/2020   CL 100 02/27/2020   CO2 27 02/27/2020   Lab Results  Component Value Date   ALT 38 (H) 03/27/2020   AST 25 03/27/2020   ALKPHOS 32 (L) 03/27/2020   BILITOT 0.3 03/27/2020   Lab Results  Component Value Date   WBC 4.2 09/25/2019   HGB 13.9 09/25/2019   HCT 42.3 09/25/2019   MCV 88.1 09/25/2019   PLT 224 09/25/2019   Attestation Statements:   Reviewed by clinician on day of visit: allergies, medications, problem list, medical history, surgical history, family history, social history, and previous encounter notes.  I, Water quality scientist, CMA, am acting as Location manager for [employer], [place].  I have reviewed the above documentation for accuracy and completeness, and I agree with the above. -  Georgianne Fick, FNP

## 2020-06-01 NOTE — Telephone Encounter (Signed)
Report is in, no significant abnormalities

## 2020-06-03 ENCOUNTER — Ambulatory Visit (INDEPENDENT_AMBULATORY_CARE_PROVIDER_SITE_OTHER): Payer: 59 | Admitting: Family Medicine

## 2020-06-03 ENCOUNTER — Encounter (INDEPENDENT_AMBULATORY_CARE_PROVIDER_SITE_OTHER): Payer: Self-pay

## 2020-06-03 ENCOUNTER — Other Ambulatory Visit: Payer: Self-pay

## 2020-06-03 ENCOUNTER — Ambulatory Visit (INDEPENDENT_AMBULATORY_CARE_PROVIDER_SITE_OTHER): Payer: BC Managed Care – PPO | Admitting: Bariatrics

## 2020-06-03 VITALS — BP 118/73 | HR 56 | Temp 97.7°F | Ht 67.0 in | Wt 268.0 lb

## 2020-06-03 DIAGNOSIS — Z6841 Body Mass Index (BMI) 40.0 and over, adult: Secondary | ICD-10-CM | POA: Diagnosis not present

## 2020-06-03 DIAGNOSIS — G478 Other sleep disorders: Secondary | ICD-10-CM | POA: Diagnosis not present

## 2020-06-03 DIAGNOSIS — I10 Essential (primary) hypertension: Secondary | ICD-10-CM

## 2020-06-08 ENCOUNTER — Encounter (INDEPENDENT_AMBULATORY_CARE_PROVIDER_SITE_OTHER): Payer: Self-pay | Admitting: Bariatrics

## 2020-06-08 NOTE — Progress Notes (Signed)
Chief Complaint:   OBESITY [name] [name] is here to discuss her progress with her obesity treatment plan along with follow-up of her obesity related diagnoses. [name] [name] is on keeping a food journal and adhering to recommended goals of 1500-1600 calories and 95 grams of protein daily and states she is following her eating plan approximately 95% of the time. [name] [name] states she is walking for 15 minutes 3-4 times per week.  Today's visit was #: 6 Starting weight: 270 lbs Starting date: 03/19/2020 Today's weight: 268 lbs Today's date: 06/03/2020 Total lbs lost to date: 2 Total lbs lost since last in-office visit: 0  Interim History: [name] [name] is up 2 lbs since her last visit. She was prescribed Wegovy at her last vist, but still working on prior authorization.  Subjective:   1. Nocturnal sleep-related eating disorder [name] [name] is taking Topamax currently.  2. Essential hypertension [name] [name]'s blood pressure is controlled.  Assessment/Plan:   1. Nocturnal sleep-related eating disorder [name] [name] will continue her medications, and we will schedule her to see Dr. Mallie Mussel, our Bariatric Psychologist for evaluation.  2. Essential hypertension [name] [name] will continue her medications, and will continue working on healthy weight loss and exercise to improve blood pressure control. We will watch for signs of hypotension as she continues her lifestyle modifications.  3. Class 3 severe obesity with serious comorbidity and body mass index (BMI) of 40.0 to 44.9 in adult, unspecified obesity type (HCC) [name] [name] is currently in the action stage of change. As such, her goal is to continue with weight loss efforts. She has agreed to keeping a food journal and adhering to recommended goals of 1500-1600 calories and 90 grams of protein daily.   Will check the status of Wegovy.  Exercise goals: As is.  Behavioral modification strategies: increasing lean protein intake, decreasing simple carbohydrates,  increasing vegetables, increasing water intake, decreasing eating out, no skipping meals, meal planning and cooking strategies, keeping healthy foods in the home and planning for success.  [name] [name] has agreed to follow-up with our clinic in 2 to 3 weeks with [facility], FNP-C or Dr. Juleen China. She was informed of the importance of frequent follow-up visits to maximize her success with intensive lifestyle modifications for her multiple health conditions.   Objective:   Blood pressure 118/73, pulse (!) 56, temperature 97.7 F (36.5 C), height 5\' 7"  (1.702 m), weight 268 lb (121.6 kg), last menstrual period 03/11/2018, SpO2 97 %. Body mass index is 41.97 kg/m.  General: Cooperative, alert, well developed, in no acute distress. HEENT: Conjunctivae and lids unremarkable. Cardiovascular: Regular rhythm.  Lungs: Normal work of breathing. Neurologic: No focal deficits.   Lab Results  Component Value Date   CREATININE 0.76 02/27/2020   BUN 12 02/27/2020   NA 142 02/27/2020   K 4.8 02/27/2020   CL 100 02/27/2020   CO2 27 02/27/2020   Lab Results  Component Value Date   ALT 38 (H) 03/27/2020   AST 25 03/27/2020   ALKPHOS 32 (L) 03/27/2020   BILITOT 0.3 03/27/2020   No results found for: HGBA1C No results found for: INSULIN No results found for: TSH No results found for: CHOL, HDL, LDLCALC, LDLDIRECT, TRIG, CHOLHDL Lab Results  Component Value Date   WBC 4.2 09/25/2019   HGB 13.9 09/25/2019   HCT 42.3 09/25/2019   MCV 88.1 09/25/2019   PLT 224 09/25/2019   No results found for: IRON, TIBC, FERRITIN  Attestation Statements:   Reviewed by clinician on day of visit: allergies, medications, problem  list, medical history, surgical history, family history, social history, and previous encounter notes.  Time spent on visit including pre-visit chart review and post-visit care and charting was 20 minutes.    Wilhemena Durie, am acting as Location manager for [employer], DO.  I  have reviewed the above documentation for accuracy and completeness, and I agree with the above. Jearld Lesch, DO
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
## 2020-06-17 ENCOUNTER — Other Ambulatory Visit (INDEPENDENT_AMBULATORY_CARE_PROVIDER_SITE_OTHER): Payer: Self-pay | Admitting: Family Medicine

## 2020-06-17 ENCOUNTER — Encounter (INDEPENDENT_AMBULATORY_CARE_PROVIDER_SITE_OTHER): Payer: Self-pay

## 2020-06-17 DIAGNOSIS — G478 Other sleep disorders: Secondary | ICD-10-CM

## 2020-06-18 ENCOUNTER — Encounter (INDEPENDENT_AMBULATORY_CARE_PROVIDER_SITE_OTHER): Payer: Self-pay | Admitting: Bariatrics

## 2020-06-18 ENCOUNTER — Ambulatory Visit (INDEPENDENT_AMBULATORY_CARE_PROVIDER_SITE_OTHER): Payer: BC Managed Care – PPO | Admitting: Family Medicine

## 2020-06-18 ENCOUNTER — Other Ambulatory Visit (INDEPENDENT_AMBULATORY_CARE_PROVIDER_SITE_OTHER): Payer: Self-pay | Admitting: Family Medicine

## 2020-06-18 ENCOUNTER — Other Ambulatory Visit (INDEPENDENT_AMBULATORY_CARE_PROVIDER_SITE_OTHER): Payer: Self-pay | Admitting: Bariatrics

## 2020-06-18 DIAGNOSIS — R632 Polyphagia: Secondary | ICD-10-CM

## 2020-06-18 DIAGNOSIS — G478 Other sleep disorders: Secondary | ICD-10-CM

## 2020-06-18 MED ORDER — WEGOVY 0.25 MG/0.5ML ~~LOC~~ SOAJ: 0.5000 mg | SUBCUTANEOUS | 0 refills | Status: DC

## 2020-06-18 NOTE — Telephone Encounter (Signed)
Refill request

## 2020-06-18 NOTE — Telephone Encounter (Signed)
Dr.Brown 

## 2020-06-18 NOTE — Telephone Encounter (Signed)
Refill request. Ov canceled yesterday due to [facility] being OOO.

## 2020-06-18 NOTE — Telephone Encounter (Signed)
Refill request, pt has an appt 5/4 with Dr. Juleen China

## 2020-06-23 ENCOUNTER — Encounter (INDEPENDENT_AMBULATORY_CARE_PROVIDER_SITE_OTHER): Payer: Self-pay | Admitting: Family Medicine

## 2020-06-23 ENCOUNTER — Other Ambulatory Visit (INDEPENDENT_AMBULATORY_CARE_PROVIDER_SITE_OTHER): Payer: Self-pay | Admitting: Family Medicine

## 2020-06-23 DIAGNOSIS — G478 Other sleep disorders: Secondary | ICD-10-CM

## 2020-06-24 ENCOUNTER — Telehealth (INDEPENDENT_AMBULATORY_CARE_PROVIDER_SITE_OTHER): Payer: Self-pay

## 2020-06-24 MED ORDER — TOPIRAMATE 50 MG PO TABS: 50.0000 mg | ORAL_TABLET | Freq: Every day | ORAL | 0 refills | Status: DC

## 2020-06-24 NOTE — Telephone Encounter (Signed)
Refill request, next appt 5/4

## 2020-06-24 NOTE — Telephone Encounter (Signed)
Patient called in wanting clarification on her medication TOPAMAX to know if it was denied or approved, please give patient a call.

## 2020-06-24 NOTE — Telephone Encounter (Signed)
Rx request 

## 2020-06-24 NOTE — Telephone Encounter (Signed)
Dr.Brown 

## 2020-06-24 NOTE — Telephone Encounter (Signed)
Last seen Dr Owens Shark

## 2020-06-24 NOTE — Telephone Encounter (Signed)
[name] [name] 

## 2020-06-25 ENCOUNTER — Telehealth (INDEPENDENT_AMBULATORY_CARE_PROVIDER_SITE_OTHER): Payer: BC Managed Care – PPO | Admitting: Psychology

## 2020-06-25 DIAGNOSIS — F3289 Other specified depressive episodes: Secondary | ICD-10-CM | POA: Diagnosis not present

## 2020-06-25 DIAGNOSIS — F419 Anxiety disorder, unspecified: Secondary | ICD-10-CM

## 2020-06-25 NOTE — Progress Notes (Unsigned)
Office: [phone]  /  Fax: [phone]    Date: July 08, 2020   Appointment Start Time: *** Duration: *** minutes Provider: Glennie Isle, Psy.D. Type of Session: Individual Therapy  Location of Patient: {gbptloc:23249} Location of Provider: Provider's Home (private office) Type of Contact: Telepsychological Visit via MyChart Video Visit  Session Content: [name] [name] is a 55 y.o. female presenting for a follow-up appointment to address the previously established treatment goal of increasing coping skills. Today's appointment was a telepsychological visit due to COVID-19. Arbie provided verbal consent for today's telepsychological appointment and she is aware she is responsible for securing confidentiality on her end of the session. Prior to proceeding with today's appointment, Terra's physical location at the time of this appointment was obtained as well a phone number she could be reached at in the event of technical difficulties. Verdene Lennert and this provider participated in today's telepsychological service.   This provider conducted a brief check-in and verbally administered the PHQ-9 and GAD-7. *** Brystol was receptive to today's appointment as evidenced by openness to sharing, responsiveness to feedback, and {gbreceptiveness:23401}.  Mental Status Examination:  Appearance: {Appearance:22431} Behavior: {Behavior:22445} Mood: {gbmood:21757} Affect: {Affect:22436} Speech: {Speech:22432} Eye Contact: {Eye Contact:22433} Psychomotor Activity: {Motor Activity:22434} Gait: {gbgait:23404} Thought Process: {thought process:22448}  Thought Content/Perception: {disturbances:22451} Orientation: {Orientation:22437} Memory/Concentration: {gbcognition:22449} Insight/Judgment: {Insight:22446}  Structured Assessments Results: The Patient Health Questionnaire-9 (PHQ-9) is a self-report measure that assesses symptoms and severity of depression over the course of the last two weeks.  Isela obtained a score of *** suggesting {GBPHQ9SEVERITY:21752}. Amelianna finds the endorsed symptoms to be {gbphq9difficulty:21754}. [0= Not at all; 1= Several days; 2= More than half the days; 3= Nearly every day] Little interest or pleasure in doing things ***  Feeling down, depressed, or hopeless ***  Trouble falling or staying asleep, or sleeping too much ***  Feeling tired or having little energy ***  Poor appetite or overeating ***  Feeling bad about yourself --- or that you are a failure or have let yourself or your family down ***  Trouble concentrating on things, such as reading the newspaper or watching television ***  Moving or speaking so slowly that other people could have noticed? Or the opposite --- being so fidgety or restless that you have been moving around a lot more than usual ***  Thoughts that you would be better off dead or hurting yourself in some way ***  PHQ-9 Score ***    The Generalized Anxiety Disorder-7 (GAD-7) is a brief self-report measure that assesses symptoms of anxiety over the course of the last two weeks. Jahnessa obtained a score of *** suggesting {gbgad7severity:21753}. Lyle finds the endorsed symptoms to be {gbphq9difficulty:21754}. [0= Not at all; 1= Several days; 2= Over half the days; 3= Nearly every day] Feeling nervous, anxious, on edge ***  Not being able to stop or control worrying ***  Worrying too much about different things ***  Trouble relaxing ***  Being so restless that it's hard to sit still ***  Becoming easily annoyed or irritable ***  Feeling afraid as if something awful might happen ***  GAD-7 Score ***   Interventions:  {Interventions for Progress Notes:23405}  DSM-5 Diagnosis(es): 311 (F32.8) Other Specified Depressive Disorder, Emotional Eating Behaviors and 300.00 (F41.9) Unspecified Anxiety Disorder  Treatment Goal & Progress: During the initial appointment with this provider, the following treatment goal was  established: increase coping skills. Johnelle has demonstrated progress in her goal as evidenced by {gbtxprogress:22839}. Kewanda also {gbtxprogress2:22951}.  Plan: The next appointment  will be scheduled in {gbweeks:21758}, which will be {gbtxmodality:23402}. The next session will focus on {Plan for Next Appointment:23400}.

## 2020-07-07 DIAGNOSIS — Z01419 Encounter for gynecological examination (general) (routine) without abnormal findings: Secondary | ICD-10-CM | POA: Diagnosis not present

## 2020-07-07 DIAGNOSIS — Z1389 Encounter for screening for other disorder: Secondary | ICD-10-CM | POA: Diagnosis not present

## 2020-07-07 DIAGNOSIS — Z6841 Body Mass Index (BMI) 40.0 and over, adult: Secondary | ICD-10-CM | POA: Diagnosis not present

## 2020-07-08 ENCOUNTER — Telehealth (INDEPENDENT_AMBULATORY_CARE_PROVIDER_SITE_OTHER): Payer: BC Managed Care – PPO | Admitting: Psychology

## 2020-07-15 ENCOUNTER — Encounter (INDEPENDENT_AMBULATORY_CARE_PROVIDER_SITE_OTHER): Payer: Self-pay | Admitting: Family Medicine

## 2020-07-15 ENCOUNTER — Ambulatory Visit (INDEPENDENT_AMBULATORY_CARE_PROVIDER_SITE_OTHER): Payer: BC Managed Care – PPO | Admitting: Family Medicine

## 2020-07-15 ENCOUNTER — Other Ambulatory Visit: Payer: Self-pay

## 2020-07-15 VITALS — BP 129/84 | HR 46 | Temp 98.2°F | Ht 67.0 in | Wt 259.0 lb

## 2020-07-15 DIAGNOSIS — F3289 Other specified depressive episodes: Secondary | ICD-10-CM | POA: Diagnosis not present

## 2020-07-15 DIAGNOSIS — I10 Essential (primary) hypertension: Secondary | ICD-10-CM

## 2020-07-15 DIAGNOSIS — E66813 Obesity, class 3: Secondary | ICD-10-CM

## 2020-07-15 DIAGNOSIS — Z9189 Other specified personal risk factors, not elsewhere classified: Secondary | ICD-10-CM | POA: Diagnosis not present

## 2020-07-15 DIAGNOSIS — R632 Polyphagia: Secondary | ICD-10-CM

## 2020-07-15 DIAGNOSIS — Z6841 Body Mass Index (BMI) 40.0 and over, adult: Secondary | ICD-10-CM

## 2020-07-15 MED ORDER — WEGOVY 1 MG/0.5ML ~~LOC~~ SOAJ: 1.0000 mg | SUBCUTANEOUS | 0 refills | Status: DC

## 2020-07-15 MED ORDER — WEGOVY 1.7 MG/0.75ML ~~LOC~~ SOAJ: 1.7000 mg | SUBCUTANEOUS | 0 refills | Status: DC

## 2020-07-15 MED ORDER — WEGOVY 2.4 MG/0.75ML ~~LOC~~ SOAJ: 2.4000 mg | SUBCUTANEOUS | 0 refills | Status: DC

## 2020-07-15 MED ORDER — TOPIRAMATE 50 MG PO TABS: 50.0000 mg | ORAL_TABLET | Freq: Every day | ORAL | 0 refills | Status: DC

## 2020-07-19 ENCOUNTER — Other Ambulatory Visit: Payer: Self-pay | Admitting: Oncology

## 2020-07-20 ENCOUNTER — Telehealth: Payer: Self-pay | Admitting: Oncology

## 2020-07-20 NOTE — Telephone Encounter (Signed)
R/s appts per 5/8 sch msg. Called pt, no answer. Left msg with appts date and times.

## 2020-07-22 NOTE — Progress Notes (Signed)
Chief Complaint:   OBESITY [name] [name] is here to discuss her progress with her obesity treatment plan along with follow-up of her obesity related diagnoses.   Today's visit was #: 7 Starting weight: 270 lbs Starting date: 03/19/2020 Today's weight: 259 lbs Today's date: 07/15/2020 Weight change since last visit:  9 lbs Total lbs lost to date: 11 lbs Body mass index is 40.57 kg/m.  Total weight loss percentage to date: -4.07%  Interim History:  [name] [name] says that [name] [name] has been "the key" to her weight loss.  She is tolerating the 0.5 mg dosing. Current Meal Plan: keeping a food journal and adhering to recommended goals of 1500-1600 calories and 90 grams of protein for 50% of the time.  Current Exercise Plan: Walking for 1 mile 3-4 times per week. Current Anti-Obesity Medications: Wegovy 0.5 mg subcutaneously weekly. Side effects: None.  Assessment/Plan:   1. Polyphagia Improving, but not optimized. Current treatment: Wegovy 0.5 mg subcutaneously weekly. Polyphagia refers to excessive feelings of hunger. She will continue to focus on protein-rich, low simple carbohydrate foods. We reviewed the importance of hydration, regular exercise for stress reduction, and restorative sleep.  Plan:  Will increase Wegovy to 1 mg subcutaneously weekly, then, if tolerating well, to 1.7 mg subcutaneously weekly, then to 2.4 mg subcutaneously weekly if tolerating well.  - Increase Semaglutide-Weight Management (WEGOVY) 1 MG/0.5ML SOAJ; Inject 1 mg into the skin once a week.  Dispense: 2 mL; Refill: 0 - Increase Semaglutide-Weight Management (WEGOVY) 1.7 MG/0.75ML SOAJ; Inject 1.7 mg into the skin once a week.  Dispense: 3 mL; Refill: 0 - Increase Semaglutide-Weight Management (WEGOVY) 2.4 MG/0.75ML SOAJ; Inject 2.4 mg into the skin once a week.  Dispense: 3 mL; Refill: 0  2. Essential hypertension At goal. Medications: HCTZ 25 mg daily, losartan 50 mg daily.   Plan: Avoid buying foods that are:  processed, frozen, or prepackaged to avoid excess salt. We will watch for signs of hypotension as she continues lifestyle modifications. We will continue to monitor closely alongside her PCP and/or Specialist.    BP Readings from Last 3 Encounters:  07/15/20 129/84  06/03/20 118/73  05/14/20 137/79   Lab Results  Component Value Date   CREATININE 0.76 02/27/2020   3. Other depression, with emotional eating Improving, but not optimized. Medication: Topamax 50 mg daily.  Plan:  Discussed cues and consequences, how thoughts affect eating, model of thoughts, feelings, and behaviors, and strategies for change by focusing on the cue. Discussed cognitive distortions, coping thoughts, and how to change your thoughts.  - Refill topiramate (TOPAMAX) 50 MG tablet; Take 1 tablet (50 mg total) by mouth daily.  Dispense: 30 tablet; Refill: 0  4. At risk for heart disease Due to [name] [name]'s current state of health and medical condition(s), she is at a higher risk for heart disease.  This puts the patient at much greater risk to subsequently develop cardiopulmonary conditions that can significantly affect patient's quality of life in a negative manner.    At least 8 minutes were spent on counseling [name] [name] about these concerns today. Evidence-based interventions for health behavior change were utilized today including the discussion of self monitoring techniques, problem-solving barriers, and SMART goal setting techniques.  Specifically, regarding patient's less desirable eating habits and patterns, we employed the technique of small changes when [name] [name] has not been able to fully commit to her prudent nutritional plan.  5. Obesity, current BMI 40.6  Course: [name] [name] is currently in the action stage of change.  As such, her goal is to continue with weight loss efforts.   Nutrition goals: She has agreed to keeping a food journal and adhering to recommended goals of 1500 calories and 65 grams of protein.    Exercise goals: For substantial health benefits, adults should do at least 150 minutes (2 hours and 30 minutes) a week of moderate-intensity, or 75 minutes (1 hour and 15 minutes) a week of vigorous-intensity aerobic physical activity, or an equivalent combination of moderate- and vigorous-intensity aerobic activity. Aerobic activity should be performed in episodes of at least 10 minutes, and preferably, it should be spread throughout the week.  Behavioral modification strategies: increasing lean protein intake, decreasing simple carbohydrates and increasing vegetables.  [name] [name] has agreed to follow-up with our clinic in 2 weeks. She was informed of the importance of frequent follow-up visits to maximize her success with intensive lifestyle modifications for her multiple health conditions.   Objective:   Blood pressure 129/84, pulse (!) 46, temperature 98.2 F (36.8 C), temperature source Oral, height 5\' 7"  (1.702 m), weight 259 lb (117.5 kg), last menstrual period 03/11/2018, SpO2 97 %. Body mass index is 40.57 kg/m.  General: Cooperative, alert, well developed, in no acute distress. HEENT: Conjunctivae and lids unremarkable. Cardiovascular: Regular rhythm.  Lungs: Normal work of breathing. Neurologic: No focal deficits.   Lab Results  Component Value Date   CREATININE 0.76 02/27/2020   BUN 12 02/27/2020   NA 142 02/27/2020   K 4.8 02/27/2020   CL 100 02/27/2020   CO2 27 02/27/2020   Lab Results  Component Value Date   ALT 38 (H) 03/27/2020   AST 25 03/27/2020   ALKPHOS 32 (L) 03/27/2020   BILITOT 0.3 03/27/2020   Lab Results  Component Value Date   WBC 4.2 09/25/2019   HGB 13.9 09/25/2019   HCT 42.3 09/25/2019   MCV 88.1 09/25/2019   PLT 224 09/25/2019   Attestation Statements:   Reviewed by clinician on day of visit: allergies, medications, problem list, medical history, surgical history, family history, social history, and previous encounter notes.  I, Network engineer, CMA, am acting as transcriptionist for Briscoe Deutscher, DO  I have reviewed the above documentation for accuracy and completeness, and I agree with the above. Briscoe Deutscher, DO

## 2020-07-28 ENCOUNTER — Ambulatory Visit: Payer: BC Managed Care – PPO | Admitting: Psychology

## 2020-07-28 ENCOUNTER — Ambulatory Visit (INDEPENDENT_AMBULATORY_CARE_PROVIDER_SITE_OTHER): Payer: BC Managed Care – PPO | Admitting: Psychology

## 2020-07-28 DIAGNOSIS — F411 Generalized anxiety disorder: Secondary | ICD-10-CM

## 2020-07-30 ENCOUNTER — Telehealth: Payer: Self-pay | Admitting: *Deleted

## 2020-07-30 ENCOUNTER — Encounter (INDEPENDENT_AMBULATORY_CARE_PROVIDER_SITE_OTHER): Payer: Self-pay | Admitting: Family Medicine

## 2020-07-30 ENCOUNTER — Ambulatory Visit (INDEPENDENT_AMBULATORY_CARE_PROVIDER_SITE_OTHER): Payer: BC Managed Care – PPO | Admitting: Family Medicine

## 2020-07-30 ENCOUNTER — Other Ambulatory Visit: Payer: Self-pay

## 2020-07-30 VITALS — BP 112/74 | HR 56 | Temp 97.7°F | Ht 67.0 in | Wt 261.0 lb

## 2020-07-30 DIAGNOSIS — E66813 Obesity, class 3: Secondary | ICD-10-CM

## 2020-07-30 DIAGNOSIS — Z6841 Body Mass Index (BMI) 40.0 and over, adult: Secondary | ICD-10-CM

## 2020-07-30 DIAGNOSIS — F3289 Other specified depressive episodes: Secondary | ICD-10-CM | POA: Diagnosis not present

## 2020-07-30 NOTE — Telephone Encounter (Signed)
I called patient for 90-day Identify Study phone call. Patient is having SVT and is looking for ways to help relieve anxiety. Patient will take Pilates. I reminded patient I would call her in 1 year for follow-up.

## 2020-08-03 ENCOUNTER — Encounter (INDEPENDENT_AMBULATORY_CARE_PROVIDER_SITE_OTHER): Payer: Self-pay | Admitting: Family Medicine

## 2020-08-03 DIAGNOSIS — F32A Depression, unspecified: Secondary | ICD-10-CM | POA: Insufficient documentation

## 2020-08-03 NOTE — Progress Notes (Signed)
     Chief Complaint:   OBESITY [name] is here to discuss her progress with her obesity treatment plan along with follow-up of her obesity related diagnoses. [name] [name] is on keeping a food journal and adhering to recommended goals of 1500 calories and 65 g protein and states she is following her eating plan approximately 0% of the time. [name] [name] states she is walking 20 minutes 3 times per week.  Today's visit was #: 8 Starting weight: 270 lbs Starting date: 03/19/2020 Today's weight: 261 lbs Today's date: 07/30/2020 Total lbs lost to date: 9 Total lbs lost since last in-office visit: 0  Interim History: [name] [name] is working well for appetite suppression. [name] [name] is about to start the 1 mg dose. She has slight nausea and decrease in frequency of BMs.  She feels lack of commitment to journaling.  She will be starting Pilates next month.  Subjective:   1. Other depression, with emotional eating [name] [name] notices nighttime snacking increased when she stopped Topamax, so she restarted it. She has started with a new counselor and has another appt next week. She is taking propranolol once daily for anxiety. And feels it has made a big difference in her ability to manage her anxiety.   Assessment/Plan:   1. Other depression, with emotional eating Continue propranolol once daily for anxiety.   2. Obesity, current BMI 40.87 [name] [name] is currently in the action stage of change. As such, her goal is to continue with weight loss efforts. She has agreed to keeping a food journal and adhering to recommended goals of 1300-1400 calories and 80 g protein.   Exercise goals: Start Pilates in a few weeks.  Behavioral modification strategies: increasing lean protein intake, decreasing simple carbohydrates, meal planning and cooking strategies and planning for success.  [name] [name] has agreed to follow-up with our clinic in 3 weeks with Dr. Juleen China and 5-6 weeks with [facility].   Objective:   Blood pressure  112/74, pulse (!) 56, temperature 97.7 F (36.5 C), height 5\' 7"  (1.702 m), weight 261 lb (118.4 kg), last menstrual period 03/11/2018, SpO2 97 %. Body mass index is 40.88 kg/m.  General: Cooperative, alert, well developed, in no acute distress. HEENT: Conjunctivae and lids unremarkable. Cardiovascular: Regular rhythm.  Lungs: Normal work of breathing. Neurologic: No focal deficits.   Lab Results  Component Value Date   CREATININE 0.76 02/27/2020   BUN 12 02/27/2020   NA 142 02/27/2020   K 4.8 02/27/2020   CL 100 02/27/2020   CO2 27 02/27/2020   Lab Results  Component Value Date   ALT 38 (H) 03/27/2020   AST 25 03/27/2020   ALKPHOS 32 (L) 03/27/2020   BILITOT 0.3 03/27/2020   No results found for: HGBA1C No results found for: INSULIN No results found for: TSH No results found for: CHOL, HDL, LDLCALC, LDLDIRECT, TRIG, CHOLHDL Lab Results  Component Value Date   WBC 4.2 09/25/2019   HGB 13.9 09/25/2019   HCT 42.3 09/25/2019   MCV 88.1 09/25/2019   PLT 224 09/25/2019   No results found for: IRON, TIBC, FERRITIN   Attestation Statements:   Reviewed by clinician on day of visit: allergies, medications, problem list, medical history, surgical history, family history, social history, and previous encounter notes.  Coral Ceo, CMA, am acting as Location manager for [employer], [place].  I have reviewed the above documentation for accuracy and completeness, and I agree with the above. -  Georgianne Fick, FNP

## 2020-08-04 ENCOUNTER — Ambulatory Visit (INDEPENDENT_AMBULATORY_CARE_PROVIDER_SITE_OTHER): Payer: BC Managed Care – PPO | Admitting: Psychology

## 2020-08-04 DIAGNOSIS — F411 Generalized anxiety disorder: Secondary | ICD-10-CM

## 2020-08-05 ENCOUNTER — Ambulatory Visit: Payer: BC Managed Care – PPO | Admitting: Psychology

## 2020-08-20 ENCOUNTER — Other Ambulatory Visit: Payer: Self-pay

## 2020-08-20 ENCOUNTER — Encounter (INDEPENDENT_AMBULATORY_CARE_PROVIDER_SITE_OTHER): Payer: Self-pay | Admitting: Family Medicine

## 2020-08-20 ENCOUNTER — Ambulatory Visit (INDEPENDENT_AMBULATORY_CARE_PROVIDER_SITE_OTHER): Payer: BC Managed Care – PPO | Admitting: Family Medicine

## 2020-08-20 VITALS — BP 116/72 | HR 53 | Temp 98.0°F | Ht 67.0 in | Wt 259.0 lb

## 2020-08-20 DIAGNOSIS — G478 Other sleep disorders: Secondary | ICD-10-CM

## 2020-08-20 DIAGNOSIS — F3289 Other specified depressive episodes: Secondary | ICD-10-CM

## 2020-08-20 DIAGNOSIS — E66813 Obesity, class 3: Secondary | ICD-10-CM

## 2020-08-20 DIAGNOSIS — R632 Polyphagia: Secondary | ICD-10-CM | POA: Diagnosis not present

## 2020-08-20 DIAGNOSIS — Z9189 Other specified personal risk factors, not elsewhere classified: Secondary | ICD-10-CM

## 2020-08-20 DIAGNOSIS — F418 Other specified anxiety disorders: Secondary | ICD-10-CM

## 2020-08-20 DIAGNOSIS — Z6841 Body Mass Index (BMI) 40.0 and over, adult: Secondary | ICD-10-CM

## 2020-08-20 DIAGNOSIS — R7303 Prediabetes: Secondary | ICD-10-CM | POA: Diagnosis not present

## 2020-08-20 MED ORDER — TOPIRAMATE 50 MG PO TABS: 50.0000 mg | ORAL_TABLET | Freq: Every day | ORAL | 0 refills | Status: DC

## 2020-08-21 ENCOUNTER — Ambulatory Visit (INDEPENDENT_AMBULATORY_CARE_PROVIDER_SITE_OTHER): Payer: BC Managed Care – PPO | Admitting: Psychology

## 2020-08-21 DIAGNOSIS — F411 Generalized anxiety disorder: Secondary | ICD-10-CM

## 2020-08-21 DIAGNOSIS — Z1231 Encounter for screening mammogram for malignant neoplasm of breast: Secondary | ICD-10-CM | POA: Diagnosis not present

## 2020-08-25 DIAGNOSIS — K2100 Gastro-esophageal reflux disease with esophagitis, without bleeding: Secondary | ICD-10-CM | POA: Diagnosis not present

## 2020-08-25 DIAGNOSIS — N951 Menopausal and female climacteric states: Secondary | ICD-10-CM | POA: Diagnosis not present

## 2020-08-25 DIAGNOSIS — I10 Essential (primary) hypertension: Secondary | ICD-10-CM | POA: Diagnosis not present

## 2020-08-26 NOTE — Progress Notes (Signed)
Chief Complaint:   OBESITY [name] [name] is here to discuss her progress with her obesity treatment plan along with follow-up of her obesity related diagnoses.   Today's visit was #: 9 Starting weight: 270 lbs Starting date: 03/19/2020 Today's weight: 259 lbs Today's date: 08/20/2020 Weight change since last visit: -2 lbs Total lbs lost to date: 11 lbs Body mass index is 40.57 kg/m.  Total weight loss percentage to date: -4.07%  Interim History: [name] [name] reports increase stress and is seeing a therapist. She is starting piliates next week. She is about to go up on her Wegovy to 1.7 mg. Today's bioimpedance results indicate that [name] [name] has gained 3 pounds of muscle weight and lost 4 pounds of fat since her last visit.  Current Meal Plan: keeping a food journal and adhering to recommended goals of 1300-1400 calories and 80 grams of protein for 50% of the time.  Current Exercise Plan: Increased walking. Current Anti-Obesity Medications: Wegovy 1 mg subcutaneously weekly. Side effects: none.  Assessment/Plan:   1. Polyphagia Not at goal. Current treatment: Wegovy 1 mg subcutaneously weekly. Polyphagia refers to excessive feelings of hunger. She will continue to focus on protein-rich, low simple carbohydrate foods. We reviewed the importance of hydration, regular exercise for stress reduction, and restorative sleep.  2. Prediabetes Plan:  She will continue to focus on protein-rich, low simple carbohydrate foods. We reviewed the importance of hydration, regular exercise for stress reduction, and restorative sleep.   3. Nocturnal sleep-related eating disorder Night Eating Syndrome (NES) is currently included in the "Other Specified Feeding or Eating Disorder" category of the DSM-5.  Counseling Night Eating Syndrome is characterized by eating after awakening from sleep or by excessive food consumption (usually > 25% of daily calories) after the evening meal. It is "relapsing and  remitting," meaning that it comes and goes. It usually worsens during stressful times in your life. The person is aware and recalls the eating. Night Eating Syndrome usually starts in early adulthood. Men and women are both affected, but women tend to have more severe symptoms.  Interesting statistics: The prevalence of NES is 1.5% of the general population, but found in 8.9% of patients that enroll in an obesity medicine program. Approximately 15-20% ALSO have Binge Eating Disorder.  Treatment for Night Eating Syndrome include: CBT, SSRIs, Topiramate, Progressive Muscle Relaxation, Phototherapy, and Normalized Eating.  4. Situational anxiety Behavior modification techniques were discussed today to help [name] [name] deal with her anxiety.    5. Other depression, with emotional eating Improving, but not optimized. Medication: Topamax 50 mg daily.   Plan:  Discussed cues and consequences, how thoughts affect eating, model of thoughts, feelings, and behaviors, and strategies for change by focusing on the cue. Discussed cognitive distortions, coping thoughts, and how to change your thoughts.  - Refill topiramate (TOPAMAX) 50 MG tablet; Take 1 tablet (50 mg total) by mouth daily.  Dispense: 90 tablet; Refill: 0  6. At risk for heart disease Due to [name] [name]'s current state of health and medical condition(s), she is at a higher risk for heart disease.   This puts the patient at much greater risk to subsequently develop cardiopulmonary conditions that can significantly affect patient's quality of life in a negative manner as well.    At least 8 minutes was spent on counseling [name] [name] about these concerns today. Initial goal is to lose at least 5-10% of starting weight to help reduce these risk factors.  We will continue to reassess these conditions on a  fairly regular basis in an attempt to decrease patient's overall morbidity and mortality.  Evidence-based interventions for health behavior change were  utilized today including the discussion of self monitoring techniques, problem-solving barriers and SMART goal setting techniques.  Specifically regarding patient's less desirable eating habits and patterns, we employed the technique of small changes when Jaleeah has not been able to fully commit to her prudent nutritional plan.  7. Obesity, current BMI 40.6 Course: [name] [name] is currently in the action stage of change. As such, her goal is to continue with weight loss efforts.   Nutrition goals: She has agreed to keeping a food journal and adhering to recommended goals of 1300-1400 calories and 80 grams of protein.   Exercise goals: For substantial health benefits, adults should do at least 150 minutes (2 hours and 30 minutes) a week of moderate-intensity, or 75 minutes (1 hour and 15 minutes) a week of vigorous-intensity aerobic physical activity, or an equivalent combination of moderate- and vigorous-intensity aerobic activity. Aerobic activity should be performed in episodes of at least 10 minutes, and preferably, it should be spread throughout the week.  Behavioral modification strategies: increasing lean protein intake, decreasing simple carbohydrates, and increasing vegetables.  [name] [name] has agreed to follow-up with our clinic in 3 weeks. She was informed of the importance of frequent follow-up visits to maximize her success with intensive lifestyle modifications for her multiple health conditions.   Objective:   Blood pressure 116/72, pulse (!) 53, temperature 98 F (36.7 C), temperature source Oral, height 5\' 7"  (1.702 m), weight 259 lb (117.5 kg), last menstrual period 03/11/2018, SpO2 96 %. Body mass index is 40.57 kg/m.  General: Cooperative, alert, well developed, in no acute distress. HEENT: Conjunctivae and lids unremarkable. Cardiovascular: Regular rhythm.  Lungs: Normal work of breathing. Neurologic: No focal deficits.   Lab Results  Component Value Date   CREATININE 0.76  02/27/2020   BUN 12 02/27/2020   NA 142 02/27/2020   K 4.8 02/27/2020   CL 100 02/27/2020   CO2 27 02/27/2020   Lab Results  Component Value Date   ALT 38 (H) 03/27/2020   AST 25 03/27/2020   ALKPHOS 32 (L) 03/27/2020   BILITOT 0.3 03/27/2020   Lab Results  Component Value Date   WBC 4.2 09/25/2019   HGB 13.9 09/25/2019   HCT 42.3 09/25/2019   MCV 88.1 09/25/2019   PLT 224 09/25/2019   Attestation Statements:   Reviewed by clinician on day of visit: allergies, medications, problem list, medical history, surgical history, family history, social history, and previous encounter notes.  Leodis Binet Friedenbach, CMA, am acting as Location manager for [employer], DO.  I have reviewed the above documentation for accuracy and completeness, and I agree with the above. Briscoe Deutscher, DO

## 2020-08-31 ENCOUNTER — Encounter (INDEPENDENT_AMBULATORY_CARE_PROVIDER_SITE_OTHER): Payer: Self-pay | Admitting: Family Medicine

## 2020-09-10 ENCOUNTER — Encounter (INDEPENDENT_AMBULATORY_CARE_PROVIDER_SITE_OTHER): Payer: Self-pay | Admitting: Family Medicine

## 2020-09-10 ENCOUNTER — Other Ambulatory Visit: Payer: Self-pay

## 2020-09-10 ENCOUNTER — Ambulatory Visit (INDEPENDENT_AMBULATORY_CARE_PROVIDER_SITE_OTHER): Payer: BC Managed Care – PPO | Admitting: Family Medicine

## 2020-09-10 ENCOUNTER — Ambulatory Visit: Payer: BC Managed Care – PPO | Admitting: Psychology

## 2020-09-10 VITALS — BP 110/70 | HR 60 | Temp 97.5°F | Ht 67.0 in | Wt 258.0 lb

## 2020-09-10 DIAGNOSIS — F3289 Other specified depressive episodes: Secondary | ICD-10-CM

## 2020-09-10 DIAGNOSIS — K5909 Other constipation: Secondary | ICD-10-CM

## 2020-09-10 DIAGNOSIS — Z6841 Body Mass Index (BMI) 40.0 and over, adult: Secondary | ICD-10-CM | POA: Diagnosis not present

## 2020-09-17 NOTE — Progress Notes (Signed)
Chief Complaint:   OBESITY [name] [name] is here to discuss her progress with her obesity treatment plan along with follow-up of her obesity related diagnoses. [name] [name] is on keeping a food journal and adhering to recommended goals of 1300-1400 calories and 80 grams of protein daily and states she is following her eating plan approximately 70% of the time. [name] [name] states she is doing 0 minutes 0 times per week.  Today's visit was #: 10 Starting weight: 270 lbs Starting date: 03/19/2020 Today's weight: 258 lbs Today's date: 09/10/2020 Total lbs lost to date: 12 Total lbs lost since last in-office visit: 1  Interim History: [name] [name] is on 1.7 mg of Wegovy. She notes good appetite suppression. She has some bouts of nausea. She is meeting her protein goals. She usually meets the goal with a protein bar. Her husband has been hospitalized for pneumonia.  She loved her first 2 pilates classes and she wants to continue.  Subjective:   1. Other constipation [name] [name] has BMs 2/3 times per week. She denies blood in her stool. Her stools are not hard, but less frequent. She has taken up to 3 stool softeners per day.  2. Other depression, with emotional eating [name] [name] feels this is well controlled when she remembers her Topamax. She is still using propranolol once daily and she feels it really helps with her anxiety. She also on Celexa 40 mg.  Assessment/Plan:   1. Other constipation [name] [name] is to take miralax as needed. She was informed that a decrease in bowel movement frequency is normal while losing weight, but stools should not be hard or painful.   2. Other depression, with emotional eating  [name] [name] is to set an ala  3. Obesity, current BMI 40.4 [name] [name] is currently in the action stage of change. As such, her goal is to continue with weight loss efforts. She has agreed to keeping a food journal and adhering to recommended goals of 1300-1400 calories and 80 grams of protein daily.    Exercise goals: As is.  Behavioral modification strategies: increasing lean protein intake and decreasing simple carbohydrates.  [name] [name] has agreed to follow-up with our clinic in 3 weeks.  Objective:   Blood pressure 110/70, pulse 60, temperature (!) 97.5 F (36.4 C), temperature source Oral, height 5\' 7"  (1.702 m), weight 258 lb (117 kg), last menstrual period 03/11/2018, SpO2 94 %. Body mass index is 40.41 kg/m.  General: Cooperative, alert, well developed, in no acute distress. HEENT: Conjunctivae and lids unremarkable. Cardiovascular: Regular rhythm.  Lungs: Normal work of breathing. Neurologic: No focal deficits.   Lab Results  Component Value Date   CREATININE 0.76 02/27/2020   BUN 12 02/27/2020   NA 142 02/27/2020   K 4.8 02/27/2020   CL 100 02/27/2020   CO2 27 02/27/2020   Lab Results  Component Value Date   ALT 38 (H) 03/27/2020   AST 25 03/27/2020   ALKPHOS 32 (L) 03/27/2020   BILITOT 0.3 03/27/2020   No results found for: HGBA1C No results found for: INSULIN No results found for: TSH No results found for: CHOL, HDL, LDLCALC, LDLDIRECT, TRIG, CHOLHDL No results found for: VD25OH Lab Results  Component Value Date   WBC 4.2 09/25/2019   HGB 13.9 09/25/2019   HCT 42.3 09/25/2019   MCV 88.1 09/25/2019   PLT 224 09/25/2019   No results found for: IRON, TIBC, FERRITIN  Attestation Statements:   Reviewed by clinician on day of visit: allergies, medications, problem list, medical history, surgical  history, family history, social history, and previous encounter notes.   Wilhemena Durie, am acting as Location manager for [employer], FNP-C.  I have reviewed the above documentation for accuracy and completeness, and I agree with the above. -  Georgianne Fick, FNP

## 2020-09-21 DIAGNOSIS — K5909 Other constipation: Secondary | ICD-10-CM | POA: Insufficient documentation

## 2020-09-24 ENCOUNTER — Ambulatory Visit: Payer: BC Managed Care – PPO | Admitting: Psychology

## 2020-09-28 ENCOUNTER — Ambulatory Visit (INDEPENDENT_AMBULATORY_CARE_PROVIDER_SITE_OTHER): Payer: BC Managed Care – PPO | Admitting: Family Medicine

## 2020-09-28 ENCOUNTER — Encounter (INDEPENDENT_AMBULATORY_CARE_PROVIDER_SITE_OTHER): Payer: Self-pay

## 2020-09-29 ENCOUNTER — Other Ambulatory Visit: Payer: 59

## 2020-09-29 ENCOUNTER — Ambulatory Visit: Payer: 59 | Admitting: Oncology

## 2020-10-03 ENCOUNTER — Encounter (INDEPENDENT_AMBULATORY_CARE_PROVIDER_SITE_OTHER): Payer: Self-pay | Admitting: Family Medicine

## 2020-10-03 DIAGNOSIS — R632 Polyphagia: Secondary | ICD-10-CM

## 2020-10-05 NOTE — Telephone Encounter (Signed)
Last OV with Dawn 

## 2020-10-06 ENCOUNTER — Other Ambulatory Visit (INDEPENDENT_AMBULATORY_CARE_PROVIDER_SITE_OTHER): Payer: Self-pay | Admitting: Family Medicine

## 2020-10-06 DIAGNOSIS — R632 Polyphagia: Secondary | ICD-10-CM

## 2020-10-06 MED ORDER — WEGOVY 2.4 MG/0.75ML ~~LOC~~ SOAJ
2.4000 mg | SUBCUTANEOUS | 0 refills | Status: DC
Start: 2020-10-06 — End: 2020-12-07

## 2020-10-06 NOTE — Telephone Encounter (Signed)
Pt asking [facility] supply [facility]

## 2020-10-06 NOTE — Telephone Encounter (Signed)
LAST APPOINTMENT DATE: 09/10/20 NEXT APPOINTMENT DATE: 10/19/20   [facility] - [place], [place] - [address] [place] [address] Phone: [phone] Fax: [phone]  [facility] - [address], [place] [address] Phone: [phone] Fax: [phone]  [facility] [place], [place] [address] Phone: [phone] Fax: [phone]  Patient is requesting a refill of the following medications: Requested Prescriptions   Pending Prescriptions Disp Refills   Semaglutide-Weight Management (WEGOVY) 2.4 MG/0.75ML SOAJ  0    Sig: Inject 2.4 mg into the skin once a week.    Date last filled: 07/25/20 Previously prescribed by Dr Juleen China  No results found for: HGBA1C Lab Results  Component Value Date   CREATININE 0.76 02/27/2020   No results found for: VD25OH  BP Readings from Last 3 Encounters:  09/10/20 110/70  08/20/20 116/72  07/30/20 112/74

## 2020-10-11 ENCOUNTER — Other Ambulatory Visit (INDEPENDENT_AMBULATORY_CARE_PROVIDER_SITE_OTHER): Payer: Self-pay | Admitting: Family Medicine

## 2020-10-11 DIAGNOSIS — F411 Generalized anxiety disorder: Secondary | ICD-10-CM

## 2020-10-12 NOTE — Telephone Encounter (Signed)
Pt last seen by Dawn Whitmire, FNP.  

## 2020-10-12 NOTE — Progress Notes (Signed)
[facility]  [phone] [phone]   ID: [name] [name]   DOB: 11/06/1965  MR#: [id number]  HKG#:[id number]  Patient Care Team: Kathyrn Lass, MD as PCP - General (Family Medicine) Paula Compton, MD as Consulting Physician (Obstetrics and Gynecology) Avyukth Bontempo, Virgie Dad, MD as Consulting Physician (Hematology and Oncology) Eppie Gibson, MD as Consulting Physician (Radiation Oncology) Alphonsa Overall, MD as Consulting Physician (General Surgery) Dillingham, Loel Lofty, DO as Attending Physician (Plastic Surgery) OTHERS:   CHIEF COMPLAINT: Estrogen receptor positive breast cancer   CURRENT TREATMENT: Observation   INTERVAL HISTORY:  [name] [name] returns today for follow-up of her estrogen receptor positive breast cancer. She continues under observation alone.   She now has mammography through Dr. Nena Alexander office.  Her most recent mammogram was April of this year.  Coronary CT scan obtained 03/20/2020 shows a coronary calcium score of 0.  Overreading showed only severe hepatic steatosis   REVIEW OF SYSTEMS: [name] [name] does Pilates for exercise, twice a week.  She greatly enjoys being a grandmother.  A detailed review of systems today was otherwise stable.  COVID 19 VACCINATION STATUS: Status post Pfizer x2 followed by 1 booster as of August 2022   BREAST CANCER HISTORY: From the original intake note:  [name] [name] is a 55 year old [place] woman who underwent a yearly screening mammography in 02/11/2011. This showed heterogeneously dense tissue and a possible mass in the left breast. Additional views 03/21/2011 showed an area of amorphous calcifications in the upper central aspect of the left breast, measuring up to 3.0 cm. There was no associated mass.   Biopsy was performed the same day and showed ([id number]) invasive ductal breast cancer, grade 1-2, with ductal carcinoma in situ, grade 1-3. The invasive tumor was 57% estrogen receptor  positive, 97% progesterone receptor positive, with an MIB-1 of 10% and no HER-2 amplification. With this information the patient proceeded to bilateral breast MRIs 03/31/2011 this showed a round enhancing mass with irregular margins measuring 6 mm, with an associated biopsy clip. There was additional clumped and linear enhancement of compatible with DCIS the total area measuring 3.1 cm. There were no other suspicious masses in either breast and there was no axillary or internal mammary adenopathy noted.   Her subsequent history is as detailed below.   PAST MEDICAL HISTORY: Past Medical History:  Diagnosis Date   Anemia    not at present   Anxiety    Arthritis    Osteoarthritis Neck and Back   Back pain    Breast cancer ([place])    Cancer ([place]) 2013   Left breast cancer   Chest pain    Depression    DJD (degenerative joint disease)    Cervical and lumbar spin   Family history of uterine cancer    sister   GERD (gastroesophageal reflux disease)    Hot flashes    Hx of seizure disorder    Patient denies this dx - pt states "never had a seizure"   Hypertension    Hx prior to lap band surgery 10 yrs ago, lost weight, no BP issues since surgery, no medication   Joint pain    Lower extremity edema    Obesity    Personal history of radiation therapy    2017 left breast   Prediabetes    S/P radiation therapy 06/13/2011 - 07/22/11   Left Breast/ 50 Gy in 25 Fractions: Left Breast Boost Boost/ 10 Gy in 5 Fractions   Seasonal allergies  Status post biopsy 03/21/11   Breast, Left, Needle Core Biopsy, Upper Central - DCIS, Grade I-III with comedo Necrosis and Calcifications. ER+, PR+, Ki-67 10%, Her2- No Amplification    PAST SURGICAL HISTORY: Past Surgical History:  Procedure Laterality Date   ABDOMINAL HYSTERECTOMY     BIOPSY BREAST  08/07/09   Breast, Right Needle core Biopsy, UOQ - Pseudoangiomatous Stromal hyperplasia   BREAST BIOPSY  03/21/11   BREAST LUMPECTOMY  04/19/11   snbx    BREAST LUMPECTOMY  05/10/11   Left Breast Lumpectomy re-excision   BREAST REDUCTION SURGERY  02/15/2012   Procedure: MAMMARY REDUCTION  (BREAST);  Surgeon: Theodoro Kos, DO;  Location: [facility];  Service: Plastics;  Laterality: Right;  RIGHT BREAST MASTOPEXY   [place], 2005   x 2   COLONOSCOPY     CYSTOSCOPY N/A 03/20/2018   Procedure: CYSTOSCOPY;  Surgeon: Paula Compton, MD;  Location: [facility];  Service: Gynecology;  Laterality: N/A;   DILATION AND CURETTAGE OF UTERUS     X 1   LAPAROSCOPIC GASTRIC BANDING  01/27/2009   REDUCTION MAMMAPLASTY Right    TUBAL LIGATION      FAMILY HISTORY Family History  Problem Relation Age of Onset   Stroke Mother        Stroke - mini   Heart disease Mother    Sudden death Mother    Anxiety disorder Mother    Obesity Mother    Cancer Father        Lymphoma - passed age 26   Skin cancer Father    Stroke Father    Skin cancer Sister    Uterine cancer Sister 73  The patient's father died at the age of 32 from sepsis in the setting of lymphoma. The patient's mother died at the age of 8 after a heart attack in the setting of multiple strokes. The patient had no brothers. She has 3 sisters. There is no history of breast or ovarian cancer in the family   GYNECOLOGIC HISTORY: Menarche age 55, first pregnancy to term age 40. She is GX P2. As noted above, she still having periods through tamoxifen, although they are slightly less frequent and briefer. She is s/p BTL.  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 She divorced her first husband, with 17 and 78 year old sons at home, with shared custody. Her 19 year old son is currently at college in [place] film making. She married Tawnie Ehresman in 2014. He worked as a Medical illustrator but is now  disabled.. He has 2 daughters from a prior marriage, age 45 and 56.  The patient attends [employer] locally   ADVANCED DIRECTIVES: In the absence of any documents to the contrary the patient's husband is her healthcare     HEALTH MAINTENANCE: Social History   Tobacco Use   Smoking status: Never   Smokeless tobacco: Never  Vaping Use   Vaping Use: Never used  Substance Use Topics   Alcohol use: Yes    Alcohol/week: 2.0 - 3.0 standard drinks    Types: 2 - 3 Glasses of wine per week   Drug use: No    Colonoscopy:  PAP:  Bone density: 04/25/2018, 0.8 (normal)  Lipid panel:  Allergies  Allergen Reactions   Prednisolone Hives    Current Outpatient Medications  Medication Sig Dispense  Refill   Bacillus Coagulans-Inulin (PROBIOTIC-PREBIOTIC PO) Take by mouth.     CALCIUM-VITAMIN D PO Take by mouth.     chlorhexidine (PERIDEX) 0.12 % solution SMARTSIG:By Mouth 2-3 Times Weekly     citalopram (CELEXA) 40 MG tablet Take 1 tablet (40 mg total) by mouth daily.     diclofenac (VOLTAREN) 0.1 % ophthalmic solution      ferrous sulfate 325 (65 FE) MG EC tablet Take 325 mg by mouth daily with breakfast.     hydrochlorothiazide (HYDRODIURIL) 25 MG tablet Take 1 tablet (25 mg total) by mouth daily.     lansoprazole (PREVACID) 30 MG capsule Take 30 mg by mouth 2 (two) times a week.     losartan (COZAAR) 50 MG tablet Take 1 tablet (50 mg total) by mouth daily. 90 tablet 3   Misc Natural Products (GLUCOSAMINE CHOND CMP ADVANCED) TABS Take by mouth.     Multiple Vitamins-Minerals (MULTIVITAMIN ADULT) CHEW 1 tablet     nitroGLYCERIN (NITROSTAT) 0.4 MG SL tablet Place 1 tablet (0.4 mg total) under the tongue every 5 (five) minutes as needed. 25 tablet 3   Omega-3 Fatty Acids (FISH OIL) 500 MG CAPS Take by mouth.     propranolol (INDERAL) 20 MG tablet Take 1 tablet (20 mg total) by mouth 3 (three) times daily. 270 tablet 0   Semaglutide-Weight Management (WEGOVY) 2.4 MG/0.75ML SOAJ  Inject 2.4 mg into the skin once a week. 9 mL 0   topiramate (TOPAMAX) 50 MG tablet Take 1 tablet (50 mg total) by mouth daily. 90 tablet 0   No current facility-administered medications for this visit.    OBJECTIVE:  white woman in no acute distress  Vitals:   10/13/20 0847  BP: 117/74  Pulse: 60  Resp: 17  Temp: 97.7 F (36.5 C)  SpO2: 98%    Wt Readings from Last 3 Encounters:  10/13/20 259 lb 4.8 oz (117.6 kg)  09/10/20 258 lb (117 kg)  08/20/20 259 lb (117.5 kg)   Body mass index is 40.61 kg/m.    ECOG FS:1 - Symptomatic but completely ambulatory  Sclerae unicteric, EOMs intact Wearing a mask No cervical or supraclavicular adenopathy Lungs no rales or rhonchi Heart regular rate and rhythm Abd soft, nontender, positive bowel sounds MSK no focal spinal tenderness, no upper extremity lymphedema Neuro: nonfocal, well oriented, appropriate affect Breasts: The right breast is unremarkable.  The left breast is status postlumpectomy and radiation.  It is slightly smaller than the right and there is slight distortion of the breast contour due to the surgery.  There is no evidence of local recurrence.  Both axillae are benign.   LAB RESULTS:  Lab Results  Component Value Date   WBC 6.3 10/13/2020   NEUTROABS 4.0 10/13/2020   HGB 13.7 10/13/2020   HCT 41.0 10/13/2020   MCV 88.0 10/13/2020   PLT 268 10/13/2020      Chemistry      Component Value Date/Time   NA 142 02/27/2020 1207   NA 141 11/22/2016 0854   K 4.8 02/27/2020 1207   K 4.0 11/22/2016 0854   CL 100 02/27/2020 1207   CL 105 05/01/2012 1044   CO2 27 02/27/2020 1207   CO2 29 11/22/2016 0854   BUN 12 02/27/2020 1207   BUN 8.8 11/22/2016 0854   CREATININE 0.76 02/27/2020 1207   CREATININE 0.91 11/22/2017 0820   CREATININE 0.8 11/22/2016 0854      Component Value Date/Time   CALCIUM 9.5 02/27/2020  1207   CALCIUM 9.4 11/22/2016 0854   ALKPHOS 32 (L) 03/27/2020 1626   ALKPHOS 17 (L) 11/22/2016 0854    AST 25 03/27/2020 1626   AST 15 11/22/2017 0820   AST 15 11/22/2016 0854   ALT 38 (H) 03/27/2020 1626   ALT 12 11/22/2017 0820   ALT 12 11/22/2016 0854   BILITOT 0.3 03/27/2020 1626   BILITOT 0.3 11/22/2017 0820   BILITOT 0.42 11/22/2016 0854       Lab Results  Component Value Date   LABCA2 23 04/05/2011    No components found for: [id number]  No results for input(s): INR in the last 168 hours.  Urinalysis No results found for: COLORURINE    STUDIES: No results found.   ASSESSMENT: 55 y.o.  [name] [name] woman status post Left lumpectomy and sentinel lymph node sampling 04/19/2011 for a T2 N0, stage IIA invasive ductal carcinoma, grade 1, estrogen and progesterone receptor positive at 57% and 97% respectively, HER-2 negative, with an MIB-1 of 10.  (1) Her Oncotype Dx predicts a distant recurrence rate of 13% if her only systemic treatment is tamoxifen for 5 years.  (2) completed radiation 07/22/2011  (3) started tamoxifen May 2013--switched to anastrozole 04/19/2018  (a) status post laparoscopic hysterectomy and bilateral salpingo-oophorectomy 03/20/2018 with benign pathology (a benign serous cystadenoma removed)  (b) bone densityy 04/25/2018 showed T score 0.8 (normal)  (c) anastrozole discontinued June 2020 with side effects  (4) genetics testing 01/06/17 through the  Multi-Gene Panel offered by [facility]  found no deleterious mutations in ALK, APC, ATM, AXIN2,BAP1,  BARD1, BLM, BMPR1A, BRCA1, BRCA2, BRIP1, CASR, CDC73, CDH1, CDK4, CDKN1B, CDKN1C, CDKN2A (p14ARF), CDKN2A (p16INK4a), CEBPA, CHEK2, CTNNA1, DICER1, DIS3L2, EGFR (c.2369C>T, p.Thr790Met variant only), EPCAM (Deletion/duplication testing only), FH, FLCN, GATA2, GPC3, GREM1 (Promoter region deletion/duplication testing only), HOXB13 (c.251G>A, p.Gly84Glu), HRAS, KIT, MAX, MEN1, MET, MITF (c.952G>A, p.Glu318Lys variant only), MLH1, MSH2, MSH3, MSH6, MUTYH, NBN, NF1, NF2, NTHL1, PALB2, PDGFRA, PHOX2B, PMS2, POLD1, POLE,  POT1, PRKAR1A, PTCH1, PTEN, RAD50, RAD51C, RAD51D, RB1, RECQL4, RET, RUNX1, SDHAF2, SDHA (sequence changes only), SDHB, SDHC, SDHD, SMAD4, SMARCA4, SMARCB1, SMARCE1, STK11, SUFU, TERT, TERT, TMEM127, TP53, TSC1, TSC2, VHL, WRN and WT1.  (a) Variant of Unknown Significance in the BRCA1 gene called c.343C>A (p.Pro115Thr). [Review by genetics 10/13/2020 finds this VUS is still undetermined.]   PLAN:   [name] [name] will be 10 years out from her definitive surgery as of early next year, with no evidence of disease recurrence.  This is very favorable.  At this point I feel comfortable releasing her to her primary care physicians.  All she will need in terms of breast cancer follow-up is her yearly mammography and a yearly physician breast exam.  I will be glad to see Edd Arbour again at any point in the future if and when the need arises but as of now are making no further routine appointments for her here.  Total encounter time 20 minutes.*     Mikyla Schachter, Virgie Dad, MD  10/13/20 9:02 AM Medical Oncology and Hematology [facility] [place], [place] [address] Tel. [phone]    Fax. [phone]   I, Wilburn Mylar, am acting as scribe for Dr. Virgie Dad. Alicea Wente.  I, Lurline Del MD, have reviewed the above documentation for accuracy and completeness, and I agree with the above.    *Total Encounter Time as defined by the Centers for Medicare and Medicaid Services includes, in addition to the face-to-face time of a patient visit (documented in  the note above) non-face-to-face time: obtaining and reviewing outside history, ordering and reviewing medications, tests or procedures, care coordination (communications with other health care professionals or caregivers) and documentation in the medical record.

## 2020-10-13 ENCOUNTER — Other Ambulatory Visit: Payer: Self-pay

## 2020-10-13 ENCOUNTER — Inpatient Hospital Stay (HOSPITAL_BASED_OUTPATIENT_CLINIC_OR_DEPARTMENT_OTHER): Payer: BC Managed Care – PPO | Admitting: Oncology

## 2020-10-13 ENCOUNTER — Inpatient Hospital Stay: Payer: BC Managed Care – PPO | Attending: Oncology

## 2020-10-13 VITALS — BP 117/74 | HR 60 | Temp 97.7°F | Resp 17 | Ht 67.0 in | Wt 259.3 lb

## 2020-10-13 DIAGNOSIS — Z923 Personal history of irradiation: Secondary | ICD-10-CM | POA: Diagnosis not present

## 2020-10-13 DIAGNOSIS — C50412 Malignant neoplasm of upper-outer quadrant of left female breast: Secondary | ICD-10-CM | POA: Diagnosis not present

## 2020-10-13 DIAGNOSIS — Z170 Estrogen receptor positive status [ER+]: Secondary | ICD-10-CM

## 2020-10-13 DIAGNOSIS — K760 Fatty (change of) liver, not elsewhere classified: Secondary | ICD-10-CM | POA: Diagnosis not present

## 2020-10-13 DIAGNOSIS — Z853 Personal history of malignant neoplasm of breast: Secondary | ICD-10-CM | POA: Insufficient documentation

## 2020-10-13 LAB — CBC WITH DIFFERENTIAL/PLATELET
Abs Immature Granulocytes: 0.01 10*3/uL (ref 0.00–0.07)
Basophils Absolute: 0 10*3/uL (ref 0.0–0.1)
Basophils Relative: 1 %
Eosinophils Absolute: 0.1 10*3/uL (ref 0.0–0.5)
Eosinophils Relative: 2 %
HCT: 41 % (ref 36.0–46.0)
Hemoglobin: 13.7 g/dL (ref 12.0–15.0)
Immature Granulocytes: 0 %
Lymphocytes Relative: 28 %
Lymphs Abs: 1.8 10*3/uL (ref 0.7–4.0)
MCH: 29.4 pg (ref 26.0–34.0)
MCHC: 33.4 g/dL (ref 30.0–36.0)
MCV: 88 fL (ref 80.0–100.0)
Monocytes Absolute: 0.4 10*3/uL (ref 0.1–1.0)
Monocytes Relative: 6 %
Neutro Abs: 4 10*3/uL (ref 1.7–7.7)
Neutrophils Relative %: 63 %
Platelets: 268 10*3/uL (ref 150–400)
RBC: 4.66 MIL/uL (ref 3.87–5.11)
RDW: 12.5 % (ref 11.5–15.5)
WBC: 6.3 10*3/uL (ref 4.0–10.5)
nRBC: 0 % (ref 0.0–0.2)

## 2020-10-13 LAB — COMPREHENSIVE METABOLIC PANEL
ALT: 20 U/L (ref 0–44)
AST: 17 U/L (ref 15–41)
Albumin: 3.9 g/dL (ref 3.5–5.0)
Alkaline Phosphatase: 25 U/L — ABNORMAL LOW (ref 38–126)
Anion gap: 8 (ref 5–15)
BUN: 14 mg/dL (ref 6–20)
CO2: 31 mmol/L (ref 22–32)
Calcium: 9.8 mg/dL (ref 8.9–10.3)
Chloride: 99 mmol/L (ref 98–111)
Creatinine, Ser: 0.92 mg/dL (ref 0.44–1.00)
GFR, Estimated: >60 mL/min (ref >60)
Glucose, Bld: 89 mg/dL (ref 70–99)
Potassium: 4.5 mmol/L (ref 3.5–5.1)
Sodium: 138 mmol/L (ref 135–145)
Total Bilirubin: 0.5 mg/dL (ref 0.3–1.2)
Total Protein: 7.4 g/dL (ref 6.5–8.1)

## 2020-10-19 ENCOUNTER — Ambulatory Visit (INDEPENDENT_AMBULATORY_CARE_PROVIDER_SITE_OTHER): Payer: BC Managed Care – PPO | Admitting: Family Medicine

## 2020-10-19 ENCOUNTER — Encounter (INDEPENDENT_AMBULATORY_CARE_PROVIDER_SITE_OTHER): Payer: Self-pay | Admitting: Family Medicine

## 2020-10-19 ENCOUNTER — Other Ambulatory Visit: Payer: Self-pay

## 2020-10-19 VITALS — BP 121/80 | HR 63 | Temp 97.7°F | Ht 67.0 in | Wt 254.0 lb

## 2020-10-19 DIAGNOSIS — R632 Polyphagia: Secondary | ICD-10-CM | POA: Diagnosis not present

## 2020-10-19 DIAGNOSIS — Z6841 Body Mass Index (BMI) 40.0 and over, adult: Secondary | ICD-10-CM

## 2020-10-19 DIAGNOSIS — E66813 Obesity, class 3: Secondary | ICD-10-CM

## 2020-10-19 DIAGNOSIS — R519 Headache, unspecified: Secondary | ICD-10-CM

## 2020-10-19 DIAGNOSIS — F411 Generalized anxiety disorder: Secondary | ICD-10-CM | POA: Diagnosis not present

## 2020-10-19 MED ORDER — PROPRANOLOL HCL 20 MG PO TABS: 20.0000 mg | ORAL_TABLET | Freq: Three times a day (TID) | ORAL | 0 refills | Status: DC

## 2020-10-19 NOTE — Progress Notes (Addendum)
Chief Complaint:   OBESITY [name] [name] is here to discuss her progress with her obesity treatment plan along with follow-up of her obesity related diagnoses. [name] [name] is on keeping a food journal and adhering to recommended goals of 1300-1400 calories and 80 grams protein and states she is following her eating plan approximately 50% of the time. [name] [name] states she is doing Pilates 50 minutes 2 times per week.  Today's visit was #: 11 Starting weight: 270 lbs Starting date: 03/19/2020 Today's weight: 254 lbs Today's date: 10/19/2020 Total lbs lost to date: 16 Total lbs lost since last in-office visit: 4  Interim History: [name] [name] food journal reveals good adherence to plan. She is meeting protein and calorie goals most days. She brought a printed food diary. She is doing very well and still doing Pilates for exercise.  Subjective:   1. Polyphagia Appetite well controlled. [name] [name] notes some dysgeusia and thinks it is from the [facility] (this is a rare side effect of Wegovy). She is on 2.4 mg weekly and tolerating it well. She feels the Topamax and Mancel Parsons are a miracle for her. She has occasional waves of nausea.    2. Nonintractable headache, unspecified chronicity pattern, unspecified headache type [name] [name] notes headache after workout on top of head and forehead for 2-15 minutes. She says it feels like pressure in her head. She saw her eye doctor 8 months ago and all was well.  3. GAD (generalized anxiety disorder) [name] [name] is taking propranolol for anxiety. She feels it works very well for her anxiety. She is also on citalopram.  Assessment/Plan:   1. Polyphagia Continue Wegovy and topiramate.   2. Nonintractable headache, unspecified chronicity pattern, unspecified headache type Considered pseudotumor cerebri but recent eye exam was normal. Hydrate well before exercise. Check BP as soon as possible, if headache occurs to see if this is BP related. Take one propranolol before  Pilates class. See PCP if this worsens or does not improve.   3. GAD (generalized anxiety disorder)  Refill- propranolol (INDERAL) 20 MG tablet; Take 1 tablet (20 mg total) by mouth 3 (three) times daily.  Dispense: 270 tablet; Refill: 0  4. Obesity, current BMI 39.77  [name] [name] is currently in the action stage of change. As such, her goal is to continue with weight loss efforts. She has agreed to keeping a food journal and adhering to recommended goals of 1300-1400 calories and 80 grams protein.   Exercise goals:  As is  Behavioral modification strategies: planning for success.  [name] [name] has agreed to follow-up with our clinic in 3 weeks. Objective:   Blood pressure 121/80, pulse 63, temperature 97.7 F (36.5 C), height '5\' 7"'$  (1.702 m), weight 254 lb (115.2 kg), last menstrual period 03/11/2018, SpO2 97 %. Body mass index is 39.78 kg/m.  General: Cooperative, alert, well developed, in no acute distress. HEENT: Conjunctivae and lids unremarkable. Cardiovascular: Regular rhythm.  Lungs: Normal work of breathing. Neurologic: No focal deficits.   Lab Results  Component Value Date   CREATININE 0.92 10/13/2020   BUN 14 10/13/2020   NA 138 10/13/2020   K 4.5 10/13/2020   CL 99 10/13/2020   CO2 31 10/13/2020   Lab Results  Component Value Date   ALT 20 10/13/2020   AST 17 10/13/2020   ALKPHOS 25 (L) 10/13/2020   BILITOT 0.5 10/13/2020   No results found for: HGBA1C No results found for: INSULIN No results found for: TSH No results found for: CHOL, HDL, LDLCALC, LDLDIRECT, TRIG, CHOLHDL  No results found for: VD25OH Lab Results  Component Value Date   WBC 6.3 10/13/2020   HGB 13.7 10/13/2020   HCT 41.0 10/13/2020   MCV 88.0 10/13/2020   PLT 268 10/13/2020    Attestation Statements:   Reviewed by clinician on day of visit: allergies, medications, problem list, medical history, surgical history, family history, social history, and previous encounter notes.  Coral Ceo, CMA, am acting as Location manager for [employer], [place].  I have reviewed the above documentation for accuracy and completeness, and I agree with the above. -  Georgianne Fick, FNP

## 2020-11-12 ENCOUNTER — Encounter (INDEPENDENT_AMBULATORY_CARE_PROVIDER_SITE_OTHER): Payer: Self-pay | Admitting: Family Medicine

## 2020-11-12 ENCOUNTER — Other Ambulatory Visit: Payer: Self-pay

## 2020-11-12 ENCOUNTER — Ambulatory Visit (INDEPENDENT_AMBULATORY_CARE_PROVIDER_SITE_OTHER): Payer: BC Managed Care – PPO | Admitting: Family Medicine

## 2020-11-12 VITALS — BP 122/78 | HR 66 | Temp 98.4°F | Ht 67.0 in | Wt 253.0 lb

## 2020-11-12 DIAGNOSIS — Z6841 Body Mass Index (BMI) 40.0 and over, adult: Secondary | ICD-10-CM

## 2020-11-12 DIAGNOSIS — R519 Headache, unspecified: Secondary | ICD-10-CM | POA: Diagnosis not present

## 2020-11-12 DIAGNOSIS — I890 Lymphedema, not elsewhere classified: Secondary | ICD-10-CM

## 2020-11-12 NOTE — Telephone Encounter (Signed)
Fyi.

## 2020-11-15 ENCOUNTER — Other Ambulatory Visit (INDEPENDENT_AMBULATORY_CARE_PROVIDER_SITE_OTHER): Payer: Self-pay | Admitting: Family Medicine

## 2020-11-15 DIAGNOSIS — F3289 Other specified depressive episodes: Secondary | ICD-10-CM

## 2020-11-17 NOTE — Progress Notes (Signed)
Chief Complaint:   OBESITY [name] [name] is here to discuss her progress with her obesity treatment plan along with follow-up of her obesity related diagnoses. [name] [name] is on keeping a food journal and adhering to recommended goals of 1300-1400 calories and 80 grams of protein and states she is following her eating plan approximately 75% of the time. [name] [name] states she is doing pilates 50 minutes 2-3 times per week and walking for 15 minutes 2 times per week.  Today's visit was #: 12 Starting weight: 270 lbs Starting date: 03/19/2020 Today's weight: 253 lbs Today's date: 11/12/2020 Total lbs lost to date: 17 lbs Total lbs lost since last in-office visit: 1 lb  Interim History: [name] [name] has not been jounraling due to being pre-occupied. She says that she tends to eat the same thing every day. Her appetite is well controlled on Wegovy and Topamax. She has occasional nausea. Her water intake could be better she says.   Subjective:   1. Nonintractable headache, unspecified chronicity pattern, unspecified headache type [name] [name] is still getting throbbing headaches with Pilates that last 10-15 min after exercise. She has started taking a Propranolol before Pilates and increased her hydration which has helped.  2. Lymphedema of left arm [name] [name] notes Lymphedema in left arm. Status post left breast cancer/ radiation therapy. [name] [name] had some lymph nods removed but nota  full axillary dissection.  Assessment/Plan:   1. Nonintractable headache, unspecified chronicity pattern, unspecified headache type [name] [name] will check blood pressure when she has headaches. She will continue taking Propranolol before Pilates and making sure she is well hydrated. She will see PCP if headaches continue.  2. Lymphedema of left arm [name] [name] was referred to cancer center for treatment of Lymphedema. - Ambulatory referral to Physical Therapy  3. Obesity with current BMI of 39.62 [name] [name] is currently in the  action stage of change. As such, her goal is to continue with weight loss efforts. She has agreed to keeping a food journal and adhering to recommended goals of 1300-1400 calories and 80 grams of protein daily.  Exercise goals:  As is.  Behavioral modification strategies: increasing water intake and keeping a strict food journal.  Marc has agreed to follow-up with our clinic in 3-4 weeks with Dr. Juleen China and  6-7 weeks with Jake Bathe, FNP.  Objective:   Blood pressure 122/78, pulse 66, temperature 98.4 F (36.9 C), height '5\' 7"'$  (1.702 m), weight 253 lb (114.8 kg), last menstrual period 03/11/2018, SpO2 96 %. Body mass index is 39.63 kg/m.  General: Cooperative, alert, well developed, in no acute distress. HEENT: Conjunctivae and lids unremarkable. Cardiovascular: Regular rhythm.  Lungs: Normal work of breathing. Neurologic: No focal deficits.   Lab Results  Component Value Date   CREATININE 0.92 10/13/2020   BUN 14 10/13/2020   NA 138 10/13/2020   K 4.5 10/13/2020   CL 99 10/13/2020   CO2 31 10/13/2020   Lab Results  Component Value Date   ALT 20 10/13/2020   AST 17 10/13/2020   ALKPHOS 25 (L) 10/13/2020   BILITOT 0.5 10/13/2020   No results found for: HGBA1C No results found for: INSULIN No results found for: TSH No results found for: CHOL, HDL, LDLCALC, LDLDIRECT, TRIG, CHOLHDL No results found for: VD25OH Lab Results  Component Value Date   WBC 6.3 10/13/2020   HGB 13.7 10/13/2020   HCT 41.0 10/13/2020   MCV 88.0 10/13/2020   PLT 268 10/13/2020   No results found for: IRON, TIBC, FERRITIN  Attestation Statements:   Reviewed by clinician on day of visit: allergies, medications, problem list, medical history, surgical history, family history, social history, and previous encounter notes.  I, Lizbeth Bark, RMA, am acting as Location manager for [employer], [place].   I have reviewed the above documentation for accuracy and completeness, and I agree with  the above. -  Georgianne Fick, FNP

## 2020-11-17 NOTE — Telephone Encounter (Signed)
LAST APPOINTMENT DATE: 11/12/2020 NEXT APPOINTMENT DATE: 12/07/2020   [facility] - [place], [place] [address] [place] [address] Phone: [phone] Fax: [phone]  [facility] [place], [place] [address] [place] [address] Phone: [phone] Fax: [phone]  Patient is requesting a refill of the following medications: Requested Prescriptions   Pending Prescriptions Disp Refills   topiramate (TOPAMAX) 50 MG tablet [Pharmacy Med Name: TOPIRAMATE TABS '50MG'$ ] 90 tablet 0    Sig: TAKE 1 TABLET DAILY    Date last filled: 08/20/2020 Previously prescribed by Dr. Juleen China  No results found for: HGBA1C Lab Results  Component Value Date   CREATININE 0.92 10/13/2020   No results found for: VD25OH  BP Readings from Last 3 Encounters:  11/12/20 122/78  10/19/20 121/80  10/13/20 117/74

## 2020-11-17 NOTE — Telephone Encounter (Signed)
[name] [name] 

## 2020-11-30 ENCOUNTER — Ambulatory Visit: Payer: BC Managed Care – PPO | Admitting: Physical Therapy

## 2020-12-07 ENCOUNTER — Ambulatory Visit (INDEPENDENT_AMBULATORY_CARE_PROVIDER_SITE_OTHER): Payer: BC Managed Care – PPO | Admitting: Family Medicine

## 2020-12-07 ENCOUNTER — Other Ambulatory Visit: Payer: Self-pay

## 2020-12-07 ENCOUNTER — Encounter (INDEPENDENT_AMBULATORY_CARE_PROVIDER_SITE_OTHER): Payer: Self-pay | Admitting: Family Medicine

## 2020-12-07 VITALS — BP 125/77 | HR 61 | Temp 98.1°F | Ht 67.0 in | Wt 251.0 lb

## 2020-12-07 DIAGNOSIS — K760 Fatty (change of) liver, not elsewhere classified: Secondary | ICD-10-CM

## 2020-12-07 DIAGNOSIS — Z9189 Other specified personal risk factors, not elsewhere classified: Secondary | ICD-10-CM

## 2020-12-07 DIAGNOSIS — R632 Polyphagia: Secondary | ICD-10-CM | POA: Diagnosis not present

## 2020-12-07 DIAGNOSIS — I10 Essential (primary) hypertension: Secondary | ICD-10-CM

## 2020-12-07 DIAGNOSIS — Z6841 Body Mass Index (BMI) 40.0 and over, adult: Secondary | ICD-10-CM

## 2020-12-07 MED ORDER — WEGOVY 2.4 MG/0.75ML ~~LOC~~ SOAJ: 2.4000 mg | SUBCUTANEOUS | 1 refills | Status: DC

## 2020-12-08 NOTE — Progress Notes (Signed)
Chief Complaint:   OBESITY [name] [name] is here to discuss her progress with her obesity treatment plan along with follow-up of her obesity related diagnoses.   Today's visit was #: 2 Starting weight: 270 lbs Starting date: 03/19/2020 Today's weight: 251 lbs Today's date: 12/07/2020 Weight change since last visit: 2 lbs Total lbs lost to date: 19 lbs Body mass index is 39.31 kg/m.  Total weight loss percentage to date: -7.04%  Current Meal Plan: keeping a food journal and adhering to recommended goals of 1300-1400 calories and 80 grams of protein for 100% of the time.  Current Exercise Plan: Pilates for 60 minutes 2-3 times per week. Current Anti-Obesity Medications: Wegovy 2.4 mg subcutaneously weekly. Side effects: None.  Interim History:  Today's bioimpedance results indicate that [name] [name] has gained 3 pounds of water weight since her last visit.  She had more sodium intake over the weekend.  Assessment/Plan:   1. Polyphagia At goal. Current treatment: Wegovy 2.4 mg subcutaneously weekly. She will continue to focus on protein-rich, low simple carbohydrate foods. We reviewed the importance of hydration, regular exercise for stress reduction, and restorative sleep.  Plan:  Continue Wegovy 2.4 mg subcutaneously weekly.  Will refill today, as per below.  - Refill Semaglutide-Weight Management (WEGOVY) 2.4 MG/0.75ML SOAJ; Inject 2.4 mg into the skin once a week.  Dispense: 9 mL; Refill: 1  2. Essential hypertension At goal. Medications: HCTZ 25 mg daily, Cozaar 50 mg daily.   Plan:  Continue medications.  Avoid buying foods that are: processed, frozen, or prepackaged to avoid excess salt. We will watch for signs of hypotension as she continues lifestyle modifications.  BP Readings from Last 3 Encounters:  12/07/20 125/77  11/12/20 122/78  10/19/20 121/80   Lab Results  Component Value Date   CREATININE 0.92 10/13/2020   3. Fatty liver NAFLD is an umbrella term that  encompasses a disease spectrum that includes steatosis (fat) without inflammation, steatohepatitis (NASH; fat + inflammation in a characteristic pattern), and cirrhosis. Bland steatosis is felt to be a benign condition, with extremely low to no risk of progression to cirrhosis, whereas NASH can progress to cirrhosis. The mainstay of treatment of NAFLD includes lifestyle modification to achieve weight loss, at least 7% of current body weight. Low carbohydrate diets can be beneficial in improving NAFLD liver histology. Additionally, exercise, even the absence of weight loss can have beneficial effects on the patient's metabolic profile and liver health.   4. At risk for heart disease Due to [name] [name]'s current state of health and medical condition(s), she is at a higher risk for heart disease.  This puts the patient at much greater risk to subsequently develop cardiopulmonary conditions that can significantly affect patient's quality of life in a negative manner.    At least 8 minutes were spent on counseling [name] [name] about these concerns today. Evidence-based interventions for health behavior change were utilized today including the discussion of self monitoring techniques, problem-solving barriers, and SMART goal setting techniques.  Specifically, regarding patient's less desirable eating habits and patterns, we employed the technique of small changes when [name] [name] has not been able to fully commit to her prudent nutritional plan.  5. Obesity with current BMI of 39.4  Course: [name] [name] is currently in the action stage of change. As such, her goal is to continue with weight loss efforts.   Nutrition goals: She has agreed to practicing portion control and making smarter food choices, such as increasing vegetables and decreasing simple carbohydrates. Bariatric Plate.  Exercise goals:  As is.  Behavioral modification strategies: increasing lean protein intake, decreasing simple carbohydrates, increasing  vegetables, increasing water intake, and decreasing liquid calories.  [name] [name] has agreed to follow-up with our clinic in 3-4 weeks. She was informed of the importance of frequent follow-up visits to maximize her success with intensive lifestyle modifications for her multiple health conditions.   Objective:   Blood pressure 125/77, pulse 61, temperature 98.1 F (36.7 C), temperature source Oral, height 5\' 7"  (1.702 m), weight 251 lb (113.9 kg), last menstrual period 03/11/2018, SpO2 97 %. Body mass index is 39.31 kg/m.  General: Cooperative, alert, well developed, in no acute distress. HEENT: Conjunctivae and lids unremarkable. Cardiovascular: Regular rhythm.  Lungs: Normal work of breathing. Neurologic: No focal deficits.   Lab Results  Component Value Date   CREATININE 0.92 10/13/2020   BUN 14 10/13/2020   NA 138 10/13/2020   K 4.5 10/13/2020   CL 99 10/13/2020   CO2 31 10/13/2020   Lab Results  Component Value Date   ALT 20 10/13/2020   AST 17 10/13/2020   ALKPHOS 25 (L) 10/13/2020   BILITOT 0.5 10/13/2020   Lab Results  Component Value Date   WBC 6.3 10/13/2020   HGB 13.7 10/13/2020   HCT 41.0 10/13/2020   MCV 88.0 10/13/2020   PLT 268 10/13/2020   Attestation Statements:   Reviewed by clinician on day of visit: allergies, medications, problem list, medical history, surgical history, family history, social history, and previous encounter notes.  I, Water quality scientist, CMA, am acting as transcriptionist for Briscoe Deutscher, DO  I have reviewed the above documentation for accuracy and completeness, and I agree with the above. Briscoe Deutscher, DO

## 2020-12-21 ENCOUNTER — Encounter: Payer: Self-pay | Admitting: Physical Therapy

## 2020-12-21 ENCOUNTER — Other Ambulatory Visit: Payer: Self-pay

## 2020-12-21 ENCOUNTER — Ambulatory Visit: Payer: BC Managed Care – PPO | Attending: Family Medicine | Admitting: Physical Therapy

## 2020-12-21 DIAGNOSIS — I890 Lymphedema, not elsewhere classified: Secondary | ICD-10-CM | POA: Diagnosis not present

## 2020-12-21 DIAGNOSIS — Z853 Personal history of malignant neoplasm of breast: Secondary | ICD-10-CM | POA: Diagnosis not present

## 2020-12-21 NOTE — Therapy (Signed)
[facility] @ [place], [place], [address] Phone: [phone]   Fax:  [phone]  Physical Therapy Evaluation  Patient Details  Name: [name] [name] MRN: [id number] Date of Birth: 01/18/1966 Referring Provider (PT): Jake Bathe   Encounter Date: 12/21/2020   PT End of Session - 12/21/20 1555     Visit Number 1    Number of Visits 4    Date for PT Re-Evaluation 02/15/21    PT Start Time 1508    PT Stop Time 1554    PT Time Calculation (min) 46 min    Activity Tolerance Patient tolerated treatment well    Behavior During Therapy [facility] for tasks assessed/performed             Past Medical History:  Diagnosis Date   Anemia    not at present   Anxiety    Arthritis    Osteoarthritis Neck and Back   Back pain    Breast cancer ([place])    Cancer ([place]) 2013   Left breast cancer   Chest pain    Depression    DJD (degenerative joint disease)    Cervical and lumbar spin   Family history of uterine cancer    sister   GERD (gastroesophageal reflux disease)    Hot flashes    Hx of seizure disorder    Patient denies this dx - pt states "never had a seizure"   Hypertension    Hx prior to lap band surgery 10 yrs ago, lost weight, no BP issues since surgery, no medication   Joint pain    Lower extremity edema    Obesity    Personal history of radiation therapy    2017 left breast   Prediabetes    S/P radiation therapy 06/13/2011 - 07/22/11   Left Breast/ 50 Gy in 25 Fractions: Left Breast Boost Boost/ 10 Gy in 5 Fractions   Seasonal allergies    Status post biopsy 03/21/11   Breast, Left, Needle Core Biopsy, Upper Central - DCIS, Grade I-III with comedo Necrosis and Calcifications. ER+, PR+, Ki-67 10%, Her2- No Amplification    Past Surgical History:  Procedure Laterality Date   ABDOMINAL HYSTERECTOMY     BIOPSY BREAST  08/07/09   Breast, Right Needle core Biopsy, UOQ - Pseudoangiomatous  Stromal hyperplasia   BREAST BIOPSY  03/21/11   BREAST LUMPECTOMY  04/19/11   snbx   BREAST LUMPECTOMY  05/10/11   Left Breast Lumpectomy re-excision   BREAST REDUCTION SURGERY  02/15/2012   Procedure: MAMMARY REDUCTION  (BREAST);  Surgeon: Theodoro Kos, DO;  Location: [facility];  Service: Plastics;  Laterality: Right;  RIGHT BREAST MASTOPEXY   [facility], 2005   x 2   COLONOSCOPY     CYSTOSCOPY N/A 03/20/2018   Procedure: CYSTOSCOPY;  Surgeon: Paula Compton, MD;  Location: [facility];  Service: Gynecology;  Laterality: N/A;   DILATION AND CURETTAGE OF UTERUS     X 1   LAPAROSCOPIC GASTRIC BANDING  01/27/2009   REDUCTION MAMMAPLASTY Right    TUBAL LIGATION      There were no vitals filed for this visit.    Subjective Assessment - 12/21/20 1510     Subjective I have had swelling in my arm for years. I would just like it evaluated to find out if it is lymphedema and what I can do about it.    Pertinent History L breast cancer  ER+, PR+, HER2-, 04/19/11 L breast lumpectomy SLNB (0/4), 05/10/11 re excision of lumpectomy, completed radiation, pt was taking tamoxifen and then it was changed anastrozole and pt has completed that    Patient Stated Goals to see if the arm can be smoothed out a little bit from the swelling    Currently in Pain? No/denies                [facility] PT Assessment - 12/21/20 0001       Assessment   Medical Diagnosis left breast cancer    Referring Provider (PT) Dawn Whitmire    Onset Date/Surgical Date 04/19/11    Hand Dominance Right    Prior Therapy none      Precautions   Precautions Other (comment)    Precaution Comments at risk of lymphedema      Restrictions   Weight Bearing Restrictions No      Balance Screen   Has the patient fallen in the past 6 months No    Has the patient had a decrease in activity level because of a fear of falling?  No    Is the patient reluctant to leave their home because of a fear of falling?  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    Leisure pt does pilates 2-3x/wk      Cognition   Overall Cognitive Status Within Functional Limits for tasks assessed      Posture/Postural Control   Posture/Postural Control Postural limitations    Postural Limitations Rounded Shoulders;Forward head      AROM   Overall AROM Comments bilateral shoulder ROM WFL, pt does report an injury when weightlifting in her 70s that causes pain in certain positions when lying down               LYMPHEDEMA/ONCOLOGY QUESTIONNAIRE - 12/21/20 0001       Type   Cancer Type left breast cancer      Surgeries   Lumpectomy Date 04/19/11    Other Surgery Date 05/10/11   reexcision   Number Lymph Nodes Removed 4      Date Lymphedema/Swelling Started   Date 04/19/11      Treatment   Active Chemotherapy Treatment No    Past Chemotherapy Treatment Yes    Active Radiation Treatment No    Past Radiation Treatment Yes    Current Hormone Treatment No    Past Hormone Therapy Yes      What other symptoms do you have   Are you Having Heaviness or Tightness No    Are you having Pain No    Are you having pitting edema No    Is it Hard or Difficult finding clothes that fit Yes    Do you have infections No    Is there Decreased scar mobility No      Lymphedema Assessments   Lymphedema Assessments Upper extremities      Right Upper Extremity Lymphedema   15 cm Proximal to Olecranon Process 36.1 cm    10 cm Proximal to Olecranon Process 33.3 cm    Olecranon Process 29 cm    15 cm  Proximal to Ulnar Styloid Process 28.5 cm    10 cm Proximal to Ulnar Styloid Process 24.2 cm    Just Proximal to Ulnar Styloid  Process 17 cm    Across Hand at [employer] 19.1 cm    At [place] of 2nd Digit 5.6 cm      Left Upper Extremity Lymphedema   15 cm Proximal to Olecranon Process 38.8 cm    10 cm Proximal to Olecranon Process 37.5 cm    Olecranon Process 30.1 cm    15 cm Proximal to Ulnar Styloid Process 28.6 cm    10 cm Proximal to Ulnar Styloid Process 26 cm    Just Proximal to Ulnar Styloid Process 18.3 cm    Across Hand at [employer] 20.2 cm    At [place] of 2nd Digit 5.8 cm                     Objective measurements completed on examination: See above findings.                PT Education - 12/21/20 1602     Education Details management of lymphedema including self MLD and compression garments, compression pumps, flat knit vs circular knit    Person(s) Educated Patient    Methods Explanation    Comprehension Verbalized understanding                 PT Long Term Goals - 12/21/20 1601       PT LONG TERM GOAL #1   Title Pt will be independent in self MLD for long term management of lymphedema.    Time 8    Period Weeks    Status New    Target Date 02/15/21      PT LONG TERM GOAL #2   Title Pt will obtain appropriate compression sleeve and glove for long term management of lymphedema.    Time 8    Period Weeks    Status New    Target Date 02/15/21                    Plan - 12/21/20 1556     Clinical Impression Statement Pt reports to PT with LUE lymphedema that began in 2013 following L breast lumpectomy and SLNB for treatment of L breast cancer. Pt had to have a second surgery for re excision to clear margins. Pt reports her arm has been swelling since that time. The majority of her swelling is located in her upper arm and is 4 cm larger than her right side which is her dominent side. Educated pt about treatment for lymphedema including self MLD and compression garments. Pt would benefit from skilled PT services to be instructed in self MLD  and to be measured for a compression sleeve and glove.    Stability/Clinical Decision Making Stable/Uncomplicated    Clinical Decision Making Low    Rehab Potential Good    PT Frequency Biweekly    PT Duration 8 weeks    PT Treatment/Interventions ADLs/Self Care Home Management;Patient/family education;Manual lymph drainage;Manual techniques;Compression bandaging;Orthotic Fit/Training    PT Next Visit Plan instruct in self MLD, see what her insurance company told her about coverage, send to a special place or see if there is another off the shelf other than exo strong    Consulted and Agree with Plan of Care Patient             Patient will benefit  from skilled therapeutic intervention in order to improve the following deficits and impairments:  Postural dysfunction, Increased edema  Visit Diagnosis: Lymphedema, not elsewhere classified  History of left breast cancer     Problem List Patient Active Problem List   Diagnosis Date Noted   Hepatic steatosis 10/13/2020   Other constipation 09/21/2020   Depression 08/03/2020   Nocturnal sleep-related eating disorder 05/18/2020   GAD (generalized anxiety disorder) 05/18/2020   Class 3 severe obesity with serious comorbidity and body mass index (BMI) of 40.0 to 44.9 in adult [facility]) 04/19/2018   S/P hysterectomy with oophorectomy 03/20/2018   Genetic testing 01/06/2017   Family history of uterine cancer    Hot flashes 10/29/2013   Anemia, unspecified 10/29/2013   History of laparoscopic adjustable gastric banding, 01/27/2009 05/24/2012   Malignant neoplasm of upper-outer quadrant of left breast in female, estrogen receptor positive ([place]) 04/05/2011    Allyson Sabal McIntyre, PT 12/21/2020, 4:03 PM  [facility] @ [place], [place], [address] Phone: [phone]   Fax:  [phone]  Name: [name] [name] MRN: [id number] Date of Birth:  25-Sep-1965  Manus Gunning, PT 12/21/20 4:03 PM

## 2020-12-28 ENCOUNTER — Encounter (INDEPENDENT_AMBULATORY_CARE_PROVIDER_SITE_OTHER): Payer: Self-pay | Admitting: Family Medicine

## 2020-12-28 ENCOUNTER — Ambulatory Visit (INDEPENDENT_AMBULATORY_CARE_PROVIDER_SITE_OTHER): Payer: BC Managed Care – PPO | Admitting: Family Medicine

## 2020-12-28 ENCOUNTER — Other Ambulatory Visit: Payer: Self-pay

## 2020-12-28 VITALS — BP 110/72 | HR 68 | Temp 98.0°F | Ht 67.0 in | Wt 247.0 lb

## 2020-12-28 DIAGNOSIS — R5383 Other fatigue: Secondary | ICD-10-CM | POA: Diagnosis not present

## 2020-12-28 DIAGNOSIS — Z6841 Body Mass Index (BMI) 40.0 and over, adult: Secondary | ICD-10-CM

## 2020-12-28 DIAGNOSIS — R632 Polyphagia: Secondary | ICD-10-CM | POA: Diagnosis not present

## 2020-12-28 NOTE — Progress Notes (Signed)
Chief Complaint:   OBESITY [name] [name] is here to discuss her progress with her obesity treatment plan along with follow-up of her obesity related diagnoses. [name] [name] is on practicing portion control and making smarter food choices, such as increasing vegetables and decreasing simple carbohydrates and states she is following her eating plan approximately 80% of the time. [name] [name] states she is doing Pilates for 50 minutes 2-3 times per week.  Today's visit was #: 14 Starting weight: 270 lbs Starting date: 03/19/2020 Today's weight: 247 lbs Today's date: 12/28/2020 Total lbs lost to date: 23 lbs Total lbs lost since last in-office visit: 4 lbs  Interim History: [name] [name] is not tracking her food but she is getting protein in at all meals and focusing on protein intake. She wants to journal but then finds she is too busy. She has no hunger due to the [facility]. She is still doing Pilates.  Subjective:   1. Other fatigue [name] [name] notes severe fatigue. Her last Hgb was 13.7. She sleeps fairly well. Her husband is up a lot which wakes her up. [name] [name] thinks her husband may have sleep apnea and wants to have him tested. She has OSA. A CPAP or oral appliance was recommended but she did not obtain either.   CBC Latest Ref Rng & Units 10/13/2020 09/25/2019 09/06/2018  WBC 4.0 - 10.5 K/uL 6.3 4.2 5.3  Hemoglobin 12.0 - 15.0 g/dL 13.7 13.9 11.2(L)  Hematocrit 36.0 - 46.0 % 41.0 42.3 35.1(L)  Platelets 150 - 400 K/uL 268 224 290   No results found for: IRON, TIBC, FERRITIN No results found for: VITAMINB12   2. Polyphagia [name] [name]'s appetite for cravings are well controlled with Wegovy 2.4 mg and Topamax 50 mg. She has no appetite at all.  Assessment/Plan:   1. Other fatigue We discussed various causes of fatigue -  OSA, interrupted sleep, low Vitamin D, dehydration, low food/protein intake., etc.. [name] [name] will have primary care provider check Vitamin D at upcoming lab draw. .  2. Polyphagia   [name] [name] will continue [facility] and Topamax.   3. Obesity with current BMI of 38.68 [name] [name] is currently in the action stage of change. As such, her goal is to continue with weight loss efforts. She has agreed to practicing portion control and making smarter food choices, such as increasing vegetables and decreasing simple carbohydrates 80 grams of protein.  [name] [name] will make sure she gets in at least 1000 calories per day but not over 1200.  Exercise goals:  As is.  Behavioral modification strategies: increasing lean protein intake and increasing water intake.  [name] [name] has agreed to follow-up with our clinic in 3 weeks.  Objective:   Blood pressure 110/72, pulse 68, temperature 98 F (36.7 C), height 5\' 7"  (1.702 m), weight 247 lb (112 kg), last menstrual period 03/11/2018, SpO2 96 %. Body mass index is 38.69 kg/m.  General: Cooperative, alert, well developed, in no acute distress. HEENT: Conjunctivae and lids unremarkable. Cardiovascular: Regular rhythm.  Lungs: Normal work of breathing. Neurologic: No focal deficits.   Lab Results  Component Value Date   CREATININE 0.92 10/13/2020   BUN 14 10/13/2020   NA 138 10/13/2020   K 4.5 10/13/2020   CL 99 10/13/2020   CO2 31 10/13/2020   Lab Results  Component Value Date   ALT 20 10/13/2020   AST 17 10/13/2020   ALKPHOS 25 (L) 10/13/2020   BILITOT 0.5 10/13/2020   No results found for: HGBA1C No results found for: INSULIN No results  found for: TSH No results found for: CHOL, HDL, LDLCALC, LDLDIRECT, TRIG, CHOLHDL No results found for: VD25OH Lab Results  Component Value Date   WBC 6.3 10/13/2020   HGB 13.7 10/13/2020   HCT 41.0 10/13/2020   MCV 88.0 10/13/2020   PLT 268 10/13/2020   No results found for: IRON, TIBC, FERRITIN  Attestation Statements:   Reviewed by clinician on day of visit: allergies, medications, problem list, medical history, surgical history, family history, social history, and previous  encounter notes.  Time spent on visit including pre-visit chart review and post-visit care and charting was 32 minutes.   I, Lizbeth Bark, RMA, am acting as Location manager for [employer], [place].   I have reviewed the above documentation for accuracy and completeness, and I agree with the above. -  Georgianne Fick, FNP

## 2021-01-05 ENCOUNTER — Encounter: Payer: Self-pay | Admitting: Physical Therapy

## 2021-01-18 ENCOUNTER — Encounter: Payer: Self-pay | Admitting: Physical Therapy

## 2021-01-25 ENCOUNTER — Ambulatory Visit (INDEPENDENT_AMBULATORY_CARE_PROVIDER_SITE_OTHER): Payer: BC Managed Care – PPO | Admitting: Family Medicine

## 2021-01-25 ENCOUNTER — Encounter (INDEPENDENT_AMBULATORY_CARE_PROVIDER_SITE_OTHER): Payer: Self-pay

## 2021-01-31 ENCOUNTER — Other Ambulatory Visit: Payer: Self-pay | Admitting: Cardiology

## 2021-02-10 DIAGNOSIS — I10 Essential (primary) hypertension: Secondary | ICD-10-CM | POA: Diagnosis not present

## 2021-02-10 DIAGNOSIS — Z0000 Encounter for general adult medical examination without abnormal findings: Secondary | ICD-10-CM | POA: Diagnosis not present

## 2021-02-10 DIAGNOSIS — Z9884 Bariatric surgery status: Secondary | ICD-10-CM | POA: Diagnosis not present

## 2021-02-10 DIAGNOSIS — K2100 Gastro-esophageal reflux disease with esophagitis, without bleeding: Secondary | ICD-10-CM | POA: Diagnosis not present

## 2021-02-10 DIAGNOSIS — Z23 Encounter for immunization: Secondary | ICD-10-CM | POA: Diagnosis not present

## 2021-02-10 LAB — BASIC METABOLIC PANEL
BUN: 12 (ref 4–21)
CO2: 33 — AB (ref 13–22)
Chloride: 102 (ref 99–108)
Creatinine: 1 (ref 0.5–1.1)
Glucose: 79
Potassium: 4.2 (ref 3.4–5.3)
Sodium: 141 (ref 137–147)

## 2021-02-10 LAB — COMPREHENSIVE METABOLIC PANEL
Albumin: 4.4 (ref 3.5–5.0)
Calcium: 9.9 (ref 8.7–10.7)

## 2021-02-10 LAB — CBC: RBC: 4.8 (ref 3.87–5.11)

## 2021-02-10 LAB — HEPATIC FUNCTION PANEL
ALT: 23 (ref 7–35)
AST: 18 (ref 13–35)
Alkaline Phosphatase: 26 (ref 25–125)
Bilirubin, Total: 0.6

## 2021-02-10 LAB — LIPID PANEL
Cholesterol: 242 — AB (ref 0–200)
HDL: 57 (ref 35–70)
LDL Cholesterol: 172
LDl/HDL Ratio: 4.3
Triglycerides: 80 (ref 40–160)

## 2021-02-10 LAB — CBC AND DIFFERENTIAL
HCT: 42 (ref 36–46)
Hemoglobin: 14.1 (ref 12.0–16.0)
Neutrophils Absolute: 3.4
Platelets: 301 (ref 150–399)
WBC: 6.3

## 2021-02-10 LAB — IRON,TIBC AND FERRITIN PANEL: Ferritin: 151

## 2021-02-10 LAB — VITAMIN B12: Vitamin B-12: 663

## 2021-02-11 LAB — HM MAMMOGRAPHY

## 2021-02-15 ENCOUNTER — Other Ambulatory Visit: Payer: Self-pay

## 2021-02-15 ENCOUNTER — Other Ambulatory Visit (INDEPENDENT_AMBULATORY_CARE_PROVIDER_SITE_OTHER): Payer: Self-pay | Admitting: Family Medicine

## 2021-02-15 ENCOUNTER — Encounter (INDEPENDENT_AMBULATORY_CARE_PROVIDER_SITE_OTHER): Payer: Self-pay | Admitting: Family Medicine

## 2021-02-15 ENCOUNTER — Ambulatory Visit (INDEPENDENT_AMBULATORY_CARE_PROVIDER_SITE_OTHER): Payer: BC Managed Care – PPO | Admitting: Family Medicine

## 2021-02-15 VITALS — BP 135/84 | HR 65 | Temp 97.5°F | Ht 67.0 in | Wt 241.0 lb

## 2021-02-15 DIAGNOSIS — F3289 Other specified depressive episodes: Secondary | ICD-10-CM

## 2021-02-15 DIAGNOSIS — I10 Essential (primary) hypertension: Secondary | ICD-10-CM

## 2021-02-15 DIAGNOSIS — R632 Polyphagia: Secondary | ICD-10-CM

## 2021-02-15 DIAGNOSIS — Z6841 Body Mass Index (BMI) 40.0 and over, adult: Secondary | ICD-10-CM

## 2021-02-15 MED ORDER — WEGOVY 2.4 MG/0.75ML ~~LOC~~ SOAJ: 2.4000 mg | SUBCUTANEOUS | 1 refills | Status: DC

## 2021-02-16 NOTE — Telephone Encounter (Signed)
Dr.Wallace °

## 2021-02-16 NOTE — Progress Notes (Signed)
Chief Complaint:   OBESITY [name] [name] is here to discuss her progress with her obesity treatment plan along with follow-up of her obesity related diagnoses. See Medical Weight Management Flowsheet for complete bioelectrical impedance results.  Today's visit was #: 15 Starting weight: 270 lbs Starting date: 03/19/2020 Weight change since last visit: 6 lbs Total lbs lost to date: 29 lbs Total weight loss percentage to date: -10.74%  Nutrition Plan: Bariatric Plate/1200 calories for 80% of the time. Activity: Cardio/strength training for 60 minutes 3 times per week.  Anti-obesity medications: Wegovy 2.4 mg subcutaneously weekly. Reported side effects: None.  Interim History: [name] [name] is not drinking enough water.  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  Assessment/Plan:   1. Polyphagia Controlled. Current treatment: Wegovy 2.4 mg subcutaneously weekly.    Plan:  Continue Wegovy 2.4 mg subcutaneously weekly.  Will refill today. She will continue to focus on protein-rich, low simple carbohydrate foods. We reviewed the importance of hydration, regular exercise for stress reduction, and restorative sleep.  - Refill Semaglutide-Weight Management (WEGOVY) 2.4 MG/0.75ML SOAJ; Inject 2.4 mg into the skin once a week.  Dispense: 9 mL; Refill: 1  2. Essential hypertension At goal. Medications: Cozaar 50 mg daily, HCTZ 25 mg daily, propranolol 20 mg three times daily (anxiety).   Plan:  Continue current medications.  Avoid buying foods that are: processed, frozen, or prepackaged to avoid excess salt. We will watch for signs of hypotension as she continues lifestyle modifications.  BP Readings from Last 3 Encounters:  02/15/21 135/84  12/28/20 110/72  12/07/20 125/77   Lab Results  Component Value Date   CREATININE 0.92 10/13/2020   3. Other depression, with emotional eating Improving, but not optimized. Medication: Celexa 40 mg daily, Topamax 50 mg daily.   Plan:   Discussed cues and consequences, how thoughts affect eating, model of thoughts, feelings, and behaviors, and strategies for change by focusing on the cue. Discussed cognitive distortions, coping thoughts, and how to change your thoughts.  4. Obesity with current BMI of 37.8  Course: [name] [name] is currently in the action stage of change. As such, her goal is to continue with weight loss efforts.   Nutrition goals: She has agreed to Bariatric Plate/1200 calories.   Exercise goals:  As is.  Behavioral modification strategies: increasing lean protein intake, decreasing simple carbohydrates, increasing vegetables, increasing water intake, and decreasing liquid calories.  Little has agreed to follow-up with our clinic in 4 weeks. She was informed of the importance of frequent follow-up visits to maximize her success with intensive lifestyle modifications for her multiple health conditions.   Objective:   Blood pressure 135/84, pulse 65, temperature (!) 97.5 F (36.4 C), temperature source Oral, height 5\' 7"  (1.702 m), weight 241 lb (109.3 kg), last menstrual period 03/11/2018, SpO2 96 %. Body mass index is 37.75 kg/m.  General: Cooperative, alert, well developed, in no acute distress. HEENT: Conjunctivae and lids unremarkable. Cardiovascular: Regular rhythm.  Lungs: Normal work of breathing. Neurologic: No focal deficits.   Lab Results  Component Value Date   CREATININE 0.92 10/13/2020   BUN 14 10/13/2020   NA 138 10/13/2020   K 4.5 10/13/2020   CL 99 10/13/2020   CO2 31 10/13/2020   Lab Results  Component Value Date   ALT 20 10/13/2020   AST 17 10/13/2020   ALKPHOS 25 (L) 10/13/2020   BILITOT 0.5 10/13/2020   Lab Results  Component Value Date   WBC  6.3 10/13/2020   HGB 13.7 10/13/2020   HCT 41.0 10/13/2020   MCV 88.0 10/13/2020   PLT 268 10/13/2020   Attestation Statements:   Reviewed by clinician on day of visit: allergies, medications, problem list, medical history,  surgical history, family history, social history, and previous encounter notes.  I, Water quality scientist, CMA, am acting as transcriptionist for Briscoe Deutscher, DO  I have reviewed the above documentation for accuracy and completeness, and I agree with the above. -  Briscoe Deutscher, DO, MS, FAAFP, DABOM - Family and Bariatric Medicine.

## 2021-02-17 ENCOUNTER — Other Ambulatory Visit (INDEPENDENT_AMBULATORY_CARE_PROVIDER_SITE_OTHER): Payer: Self-pay | Admitting: Family Medicine

## 2021-02-17 DIAGNOSIS — F3289 Other specified depressive episodes: Secondary | ICD-10-CM

## 2021-02-18 MED ORDER — TOPIRAMATE 50 MG PO TABS: 50.0000 mg | ORAL_TABLET | Freq: Every day | ORAL | 0 refills | Status: DC

## 2021-02-18 NOTE — Telephone Encounter (Signed)
Pt last seen by Dr. Wallace.  

## 2021-02-28 ENCOUNTER — Encounter (INDEPENDENT_AMBULATORY_CARE_PROVIDER_SITE_OTHER): Payer: Self-pay | Admitting: Family Medicine

## 2021-03-01 ENCOUNTER — Encounter (INDEPENDENT_AMBULATORY_CARE_PROVIDER_SITE_OTHER): Payer: Self-pay | Admitting: Family Medicine

## 2021-03-01 NOTE — Telephone Encounter (Signed)
Dr.Wallace °

## 2021-03-01 NOTE — Telephone Encounter (Signed)
Pt last seen by Dr. Wallace.  

## 2021-03-02 NOTE — Telephone Encounter (Signed)
Prior authorization started for Wegovy. Will notify patient and provider once response is received.  

## 2021-03-04 ENCOUNTER — Encounter (INDEPENDENT_AMBULATORY_CARE_PROVIDER_SITE_OTHER): Payer: Self-pay

## 2021-03-10 ENCOUNTER — Other Ambulatory Visit (INDEPENDENT_AMBULATORY_CARE_PROVIDER_SITE_OTHER): Payer: Self-pay | Admitting: Family Medicine

## 2021-03-25 ENCOUNTER — Encounter (INDEPENDENT_AMBULATORY_CARE_PROVIDER_SITE_OTHER): Payer: Self-pay

## 2021-03-25 ENCOUNTER — Other Ambulatory Visit (INDEPENDENT_AMBULATORY_CARE_PROVIDER_SITE_OTHER): Payer: Self-pay | Admitting: Family Medicine

## 2021-03-25 DIAGNOSIS — F411 Generalized anxiety disorder: Secondary | ICD-10-CM

## 2021-03-25 NOTE — Telephone Encounter (Signed)
Msg sent to pt 

## 2021-03-29 ENCOUNTER — Encounter (INDEPENDENT_AMBULATORY_CARE_PROVIDER_SITE_OTHER): Payer: Self-pay | Admitting: Family Medicine

## 2021-03-29 ENCOUNTER — Ambulatory Visit (INDEPENDENT_AMBULATORY_CARE_PROVIDER_SITE_OTHER): Payer: BC Managed Care – PPO | Admitting: Family Medicine

## 2021-03-29 ENCOUNTER — Other Ambulatory Visit: Payer: Self-pay

## 2021-03-29 VITALS — BP 116/73 | HR 65 | Temp 98.5°F | Ht 67.0 in | Wt 243.0 lb

## 2021-03-29 DIAGNOSIS — F411 Generalized anxiety disorder: Secondary | ICD-10-CM

## 2021-03-29 DIAGNOSIS — K219 Gastro-esophageal reflux disease without esophagitis: Secondary | ICD-10-CM | POA: Insufficient documentation

## 2021-03-29 DIAGNOSIS — R632 Polyphagia: Secondary | ICD-10-CM

## 2021-03-29 DIAGNOSIS — Z6838 Body mass index (BMI) 38.0-38.9, adult: Secondary | ICD-10-CM

## 2021-03-29 MED ORDER — PROPRANOLOL HCL 20 MG PO TABS: 20.0000 mg | ORAL_TABLET | Freq: Three times a day (TID) | ORAL | 0 refills | Status: DC

## 2021-03-29 NOTE — Progress Notes (Signed)
Chief Complaint:   OBESITY [name] [name] is here to discuss her progress with her obesity treatment plan along with follow-up of her obesity related diagnoses. [name] is on keeping a food journal and adhering to recommended goals of 1200 calories and 80 grams of protein and states she is following her eating plan approximately 0% of the time. [name] states she is using weight and treadmill for 30 minutes 3 times per week.  Today's visit was #: 16 Starting weight: 270 lbs Starting date: 03/19/2020 Today's weight: 243 lbs Today's date: 03/29/2021 Total lbs lost to date: 27 lbs Total lbs lost since last in-office visit: 0  Interim History: [name] reports her husband is doing much better- he has had severe depression.  This has reduced her stress tremendously. She feels she can now focus on herself. She started going to the gym 6 weeks ago. Her protein intake could be better. She averages 60 grams of protein per day.  She is going on a real vacation the end of January [facility]) and she is very excited about it.   Subjective:   1. GAD (generalized anxiety disorder) [name]'s anxiety is well controlled. She takes propranolol only once daily.   2. Polyphagia [name]'s appetite is well controlled with topiramate and Wegovy.   3. Gastroesophageal reflux disease, unspecified whether esophagitis present [name]'s GERD is fairly well controlled with lansoprazole a few days per week. She is trying to decreased frequency of proton pump inhibitors.   Assessment/Plan:   1. GAD (generalized anxiety disorder) We will refill propranolol 20 mg three times daily for 3 months with no refills.  - propranolol (INDERAL) 20 MG tablet; Take 1 tablet (20 mg total) by mouth 3 (three) times daily.  Dispense: 270 tablet; Refill: 0  2. Polyphagia [name] will continue Wegovy and topiramate.   3. Gastroesophageal reflux disease, unspecified whether esophagitis present Intensive lifestyle  modifications are the first line treatment for this issue. Elysa may continue OTC Pepcid for break through GERD.   4. Obesity: Current BMI 38.05 [name] is currently in the action stage of change. As such, her goal is to continue with weight loss efforts. She has agreed to keeping a food journal and adhering to recommended goals of 1200 calories and 80 grams of protein daily.   Exercise goals:  As is.  Behavioral modification strategies: increasing lean protein intake and keeping a strict food journal.  [name] has agreed to follow-up with our clinic in 4 weeks.  Objective:   Blood pressure 116/73, pulse 65, temperature 98.5 F (36.9 C), height 5\' 7"  (4.098 m), weight 243 lb (110.2 kg), last menstrual period 03/11/2018, SpO2 98 %. Body mass index is 38.06 kg/m.  General: Cooperative, alert, well developed, in no acute distress. HEENT: Conjunctivae and lids unremarkable. Cardiovascular: Regular rhythm.  Lungs: Normal work of breathing. Neurologic: No focal deficits.   Lab Results  Component Value Date   CREATININE 1.0 02/10/2021   BUN 12 02/10/2021   NA 141 02/10/2021   K 4.2 02/10/2021   CL 102 02/10/2021   CO2 33 (A) 02/10/2021   Lab Results  Component Value Date   ALT 23 02/10/2021   AST 18 02/10/2021   ALKPHOS 26 02/10/2021   BILITOT 0.5 10/13/2020   No results found for: HGBA1C No results found for: INSULIN No results found for: TSH Lab Results  Component Value Date   CHOL 242 (A) 02/10/2021   HDL 57 02/10/2021   LDLCALC 172 02/10/2021   TRIG 80  02/10/2021   No results found for: VD25OH Lab Results  Component Value Date   WBC 6.3 02/10/2021   HGB 14.1 02/10/2021   HCT 42 02/10/2021   MCV 88.0 10/13/2020   PLT 301 02/10/2021   Lab Results  Component Value Date   FERRITIN 151 02/10/2021   Attestation Statements:   Reviewed by clinician on day of visit: allergies, medications, problem list, medical history, surgical history, family history, social  history, and previous encounter notes.   I, Lizbeth Bark, RMA, am acting as Location manager for [employer], [place].  I have reviewed the above documentation for accuracy and completeness, and I agree with the above. -  Georgianne Fick, FNP

## 2021-04-09 ENCOUNTER — Ambulatory Visit: Payer: BC Managed Care – PPO | Admitting: Nurse Practitioner

## 2021-04-09 ENCOUNTER — Encounter: Payer: Self-pay | Admitting: Adult Health

## 2021-04-09 ENCOUNTER — Other Ambulatory Visit: Payer: Self-pay

## 2021-04-09 VITALS — BP 118/72 | HR 61 | Ht 67.0 in | Wt 244.8 lb

## 2021-04-09 DIAGNOSIS — I10 Essential (primary) hypertension: Secondary | ICD-10-CM | POA: Diagnosis not present

## 2021-04-09 DIAGNOSIS — E669 Obesity, unspecified: Secondary | ICD-10-CM

## 2021-04-09 DIAGNOSIS — I2510 Atherosclerotic heart disease of native coronary artery without angina pectoris: Secondary | ICD-10-CM | POA: Diagnosis not present

## 2021-04-09 DIAGNOSIS — G4733 Obstructive sleep apnea (adult) (pediatric): Secondary | ICD-10-CM

## 2021-04-09 DIAGNOSIS — Z6838 Body mass index (BMI) 38.0-38.9, adult: Secondary | ICD-10-CM

## 2021-04-09 DIAGNOSIS — E785 Hyperlipidemia, unspecified: Secondary | ICD-10-CM

## 2021-04-09 DIAGNOSIS — R002 Palpitations: Secondary | ICD-10-CM | POA: Diagnosis not present

## 2021-04-09 NOTE — Progress Notes (Signed)
Office Visit    Patient Name: [name] [name] Date of Encounter: 04/09/2021  Primary Care Provider:  Kathyrn Lass, MD Primary Cardiologist:  Donato Heinz, MD  Chief Complaint    56 year old female with a history of chest pain, nonobstructive CAD, hypertension, palpitations, OSA, breast cancer, GERD, anxiety and obesity who presents for follow-up related to palpitations.  Past Medical History    Past Medical History:  Diagnosis Date   Anemia    not at present   Anxiety    Arthritis    Osteoarthritis Neck and Back   Back pain    Breast cancer ([place])    Cancer ([place]) 2013   Left breast cancer   Chest pain    Depression    DJD (degenerative joint disease)    Cervical and lumbar spin   Family history of uterine cancer    sister   GERD (gastroesophageal reflux disease)    Hot flashes    Hx of seizure disorder    Patient denies this dx - pt states "never had a seizure"   Hypertension    Hx prior to lap band surgery 10 yrs ago, lost weight, no BP issues since surgery, no medication   Joint pain    Lower extremity edema    Obesity    Personal history of radiation therapy    2017 left breast   Prediabetes    S/P radiation therapy 06/13/2011 - 07/22/11   Left Breast/ 50 Gy in 25 Fractions: Left Breast Boost Boost/ 10 Gy in 5 Fractions   Seasonal allergies    Status post biopsy 03/21/11   Breast, Left, Needle Core Biopsy, [place] - DCIS, Grade I-III with comedo Necrosis and Calcifications. ER+, PR+, Ki-67 10%, Her2- No Amplification   Past Surgical History:  Procedure Laterality Date   ABDOMINAL HYSTERECTOMY     BIOPSY BREAST  08/07/09   Breast, Right Needle core Biopsy, UOQ - Pseudoangiomatous Stromal hyperplasia   BREAST BIOPSY  03/21/11   BREAST LUMPECTOMY  04/19/11   snbx   BREAST LUMPECTOMY  05/10/11   Left Breast Lumpectomy re-excision   BREAST REDUCTION SURGERY  02/15/2012   Procedure: MAMMARY REDUCTION  (BREAST);  Surgeon: Theodoro Kos, DO;   Location: [facility];  Service: Plastics;  Laterality: Right;  RIGHT BREAST MASTOPEXY   [place], 2005   x 2   COLONOSCOPY     CYSTOSCOPY N/A 03/20/2018   Procedure: CYSTOSCOPY;  Surgeon: Paula Compton, MD;  Location: [facility];  Service: Gynecology;  Laterality: N/A;   DILATION AND CURETTAGE OF UTERUS     X 1   LAPAROSCOPIC GASTRIC BANDING  01/27/2009   REDUCTION MAMMAPLASTY Right    TUBAL LIGATION      Allergies  Allergies  Allergen Reactions   Prednisolone Hives   Naltrexone-Bupropion Hcl Er Other (See Comments)   Phentermine Hcl Other (See Comments)    History of Present Illness    56 year old female with the above past medical history including chest pain, nonobstructive CAD, hypertension, palpitations, OSA, breast cancer, GERD, anxiety and obesity.  She was initially evaluated for chest pain in November 2021.  Coronary CTA January 2022 showed minimal nonobstructive CAD (0-24%).  Echo showed EF 55 to 60%, normal LV function, mild LVH significant valvular abnormalities.  Lower extremity duplex in the setting of leg pain was normal.  Her chest pain improved on propanolol.  She was last seen in the office in February 2022 and reported occasional  palpitations.  Outpatient monitor showed 11 episodes of SVT, longest lasting 17 beats with an average heart rate of 120 bpm, no significant abnormalities.  Patient triggered events corresponded to sinus rhythm plus or minus PACs/PVCs.   She presents today for follow-up. Since her last visit she has done well overall from a cardiac standpoint. She reports only 1 episode of palpitations. She continues to take propanolol and is tolerating this well.  She has lost 30 pounds in the last year working with the [facility]. States that at the time she was evaluated for chest pain she was experiencing lots of personal stress. She states her personal stress has improved dramatically and she is  feeling much better overall.  She denies any symptoms concerning for angina, denies any other concerns or complaints.   Home Medications    Current Outpatient Medications  Medication Sig Dispense Refill   Bacillus Coagulans-Inulin (PROBIOTIC-PREBIOTIC PO) Take by mouth.     chlorhexidine (PERIDEX) 0.12 % solution SMARTSIG:By Mouth 2-3 Times Weekly     citalopram (CELEXA) 40 MG tablet Take 1 tablet (40 mg total) by mouth daily.     diclofenac (VOLTAREN) 0.1 % ophthalmic solution      ferrous sulfate 325 (65 FE) MG EC tablet Take 325 mg by mouth daily with breakfast.     hydrochlorothiazide (HYDRODIURIL) 25 MG tablet Take 1 tablet (25 mg total) by mouth daily.     lansoprazole (PREVACID) 30 MG capsule Take 30 mg by mouth 2 (two) times a week.     losartan (COZAAR) 50 MG tablet TAKE 1 TABLET DAILY 90 tablet 3   Misc Natural Products (GLUCOSAMINE CHOND CMP ADVANCED) TABS Take by mouth.     Multiple Vitamins-Minerals (MULTIVITAMIN ADULT) CHEW 1 tablet     Omega-3 Fatty Acids (FISH OIL) 500 MG CAPS Take by mouth.     propranolol (INDERAL) 20 MG tablet Take 1 tablet (20 mg total) by mouth 3 (three) times daily. 270 tablet 0   Semaglutide-Weight Management (WEGOVY) 2.4 MG/0.75ML SOAJ Inject 2.4 mg into the skin once a week. 9 mL 1   topiramate (TOPAMAX) 50 MG tablet Take 1 tablet (50 mg total) by mouth daily. 90 tablet 0   No current facility-administered medications for this visit.     Review of Systems    She denies chest pain, dyspnea, pnd, orthopnea, n, v, dizziness, syncope, edema, weight gain, or early satiety. All other systems reviewed and are otherwise negative except as noted above.   Physical Exam    VS:  BP 118/72    Pulse 61    Ht _0  (1.702 m)    Wt 244 lb 12.8 oz (111 kg)    LMP 03/11/2018 (Exact Date)    SpO2 97%    BMI 38.34 kg/m   GEN: Well nourished, well developed, in no acute distress. HEENT: normal. Neck: Supple, no JVD, carotid bruits, or masses. Cardiac: RRR, no  murmurs, rubs, or gallops. No clubbing, cyanosis, edema.  Radials/DP/PT 2+ and equal bilaterally.  Respiratory:  Respirations regular and unlabored, clear to auscultation bilaterally. GI: Obese, soft, nontender, nondistended, BS + x 4. MS: no deformity or atrophy. Skin: warm and dry, no rash. Neuro:  Strength and sensation are intact. Psych: Normal affect.  Accessory Clinical Findings    ECG personally reviewed by me today - NSR, 61 bpm - no acute changes.  Lab Results  Component Value Date   WBC 6.3 02/10/2021   HGB 14.1 02/10/2021  HCT 42 02/10/2021   MCV 88.0 10/13/2020   PLT 301 02/10/2021   Lab Results  Component Value Date   CREATININE 1.0 02/10/2021   BUN 12 02/10/2021   NA 141 02/10/2021   K 4.2 02/10/2021   CL 102 02/10/2021   CO2 33 (A) 02/10/2021   Lab Results  Component Value Date   ALT 23 02/10/2021   AST 18 02/10/2021   ALKPHOS 26 02/10/2021   BILITOT 0.5 10/13/2020   Lab Results  Component Value Date   CHOL 242 (A) 02/10/2021   HDL 57 02/10/2021   LDLCALC 172 02/10/2021   TRIG 80 02/10/2021    No results found for: HGBA1C  Assessment & Plan   1. Atypical chest pain/nonobstructive CAD: Coronary CTA January 2022 showed minimal nonobstructive CAD (0-24%).  Echo showed EF 55 to 60%, normal LV function, mild LVH significant valvular abnormalities.  Symptoms were likely exacerbated in the setting of personal stress and anxiety. Stable with no anginal symptoms. No indication for ischemic evaluation. She is not on a statin at this time.  LDL was 172 in November 2022.  She has discussed statin therapy with her PCP and given her active weight loss, lifestyle modifications including diet and exercise, they agreed to defer statin therapy at this time. We discussed LDL goal <70, she verbalized understanding and states she will revisit the topic with her PCP in the future.  Continue losartan, hydrochlorothiazide, propanolol.  2. Palpitations: Outpatient monitor in  March 2022 showed 11 episodes of SVT, longest lasting 17 beats with an average heart rate of 120 bpm, no significant abnormalities. Patient triggered events corresponded to sinus rhythm plus or minus PACs/PVCs.  She reports only 1 episode of palpitations since her last visit, much improved on propanolol. Continue same.   3. Hypertension: BP well controlled. Continue current antihypertensive regimen.   4. Hyperlipidemia: See #1 above.   5. OSA: Continue CPAP.  6. Obesity: He is following with the healthy weight and wellness clinic and has lost over 30 pounds in the last year. I congratulated her on her accomplishment.  Continue lifestyle modifications with diet and exercise.  7. Disposition: Follow-up in 1 year.  Lenna Sciara, NP 04/09/2021, 1:25 PM

## 2021-04-09 NOTE — Patient Instructions (Signed)
Medication Instructions:  No Changes *If you need a refill on your cardiac medications before your next appointment, please call your pharmacy*   Lab Work: No Labs If you have labs (blood work) drawn today and your tests are completely normal, you will receive your results only by: [place] (if you have MyChart) OR A paper copy in the mail If you have any lab test that is abnormal or we need to change your treatment, we will call you to review the results.   Testing/Procedures: No Testing   Follow-Up: At [facility], you and your health needs are our priority.  As part of our continuing mission to provide you with exceptional heart care, we have created designated Provider Care Teams.  These Care Teams include your primary Cardiologist (physician) and Advanced Practice Providers (APPs -  Physician Assistants and Nurse Practitioners) who all work together to provide you with the care you need, when you need it.  We recommend signing up for the patient portal called "MyChart".  Sign up information is provided on this After Visit Summary.  MyChart is used to connect with patients for Virtual Visits (Telemedicine).  Patients are able to view lab/test results, encounter notes, upcoming appointments, etc.  Non-urgent messages can be sent to your provider as well.   To learn more about what you can do with MyChart, go to [email or web address].    Your next appointment:   1 year(s)  The format for your next appointment:   In Person  Provider:   Donato Heinz, MD

## 2021-04-26 ENCOUNTER — Other Ambulatory Visit: Payer: Self-pay

## 2021-04-26 ENCOUNTER — Ambulatory Visit (INDEPENDENT_AMBULATORY_CARE_PROVIDER_SITE_OTHER): Payer: BC Managed Care – PPO | Admitting: Family Medicine

## 2021-04-26 ENCOUNTER — Encounter (INDEPENDENT_AMBULATORY_CARE_PROVIDER_SITE_OTHER): Payer: Self-pay | Admitting: Family Medicine

## 2021-04-26 VITALS — BP 106/65 | HR 67 | Temp 98.2°F | Ht 67.0 in | Wt 243.0 lb

## 2021-04-26 DIAGNOSIS — F3289 Other specified depressive episodes: Secondary | ICD-10-CM | POA: Diagnosis not present

## 2021-04-26 DIAGNOSIS — Z6838 Body mass index (BMI) 38.0-38.9, adult: Secondary | ICD-10-CM | POA: Diagnosis not present

## 2021-04-26 DIAGNOSIS — E669 Obesity, unspecified: Secondary | ICD-10-CM | POA: Diagnosis not present

## 2021-04-26 DIAGNOSIS — R7303 Prediabetes: Secondary | ICD-10-CM

## 2021-04-26 NOTE — Progress Notes (Signed)
Chief Complaint:   OBESITY [name] [name] is here to discuss her progress with her obesity treatment plan along with follow-up of her obesity related diagnoses. [name] [name] is on keeping a food journal and adhering to recommended goals of 1200 calories and 80 grams of protein and states she is following her eating plan approximately 50% of the time. [name] [name] states she is walking for 30 minutes 3 times per week.  Today's visit was #: 41 Starting weight: 270 lbs Starting date: 03/19/2020 Today's weight: 243 lbs Today's date: 04/26/2021 Total lbs lost to date: 27 lbs Total lbs lost since last in-office visit: 0  Interim History: [name] [name] just got back from a trip to [place] but maintained her weight. She is not quite back to journaling daily yet. Her protein intake is 50 to 80 grams daily.  Subjective:   1. Pre-diabetes  [name] [name] has a diagnosis of prediabetes based on her elevated HgA1c and was informed this puts her at greater risk of developing diabetes. She is on Wegovy 2.4 mg weekly.  2. Other depression, with emotional eating [name] [name] fells topiramate is helpful but notes her husband snacks and this tempts her. She is taking topiramate in the A.M.   Assessment/Plan:   1. Pre-diabetes [name] [name] will try a protein bar at night.   2. Other depression, with emotional eating [name] [name] will move topiramate to dinner. Behavior modification techniques were discussed today to help [name] [name] deal with her emotional/non-hunger eating behaviors.  Orders and follow up as documented in patient record.   3. Obesity: Current BMI 38.05 [name] [name] is currently in the action stage of change. As such, her goal is to continue with weight loss efforts. She has agreed to keeping a food journal and adhering to recommended goals of 1200 calories and 80 grams of protein daily.   [name] [name] will choose frozen meals with 20 mg protein. We discussed higher protein breakfast options.  Exercise goals:  As  is.  Behavioral modification strategies: meal planning and cooking strategies.  [name] [name] has agreed to follow-up with our clinic in 4 weeks with Dr. Juleen China.  Objective:   Blood pressure 106/65, pulse 67, temperature 98.2 F (36.8 C), height 5\' 7"  (1.702 m), weight 243 lb (110.2 kg), last menstrual period 03/11/2018, SpO2 96 %. Body mass index is 38.06 kg/m.  General: Cooperative, alert, well developed, in no acute distress. HEENT: Conjunctivae and lids unremarkable. Cardiovascular: Regular rhythm.  Lungs: Normal work of breathing. Neurologic: No focal deficits.   Lab Results  Component Value Date   CREATININE 1.0 02/10/2021   BUN 12 02/10/2021   NA 141 02/10/2021   K 4.2 02/10/2021   CL 102 02/10/2021   CO2 33 (A) 02/10/2021   Lab Results  Component Value Date   ALT 23 02/10/2021   AST 18 02/10/2021   ALKPHOS 26 02/10/2021   BILITOT 0.5 10/13/2020   No results found for: HGBA1C No results found for: INSULIN No results found for: TSH Lab Results  Component Value Date   CHOL 242 (A) 02/10/2021   HDL 57 02/10/2021   LDLCALC 172 02/10/2021   TRIG 80 02/10/2021   No results found for: VD25OH Lab Results  Component Value Date   WBC 6.3 02/10/2021   HGB 14.1 02/10/2021   HCT 42 02/10/2021   MCV 88.0 10/13/2020   PLT 301 02/10/2021   Lab Results  Component Value Date   FERRITIN 151 02/10/2021   Attestation Statements:   Reviewed by clinician on day of visit: allergies, medications,  problem list, medical history, surgical history, family history, social history, and previous encounter notes.  Time spent on visit including pre-visit chart review and post-visit care and charting was 31 minutes.   I, Lizbeth Bark, RMA, am acting as Location manager for [employer], [place].  I have reviewed the above documentation for accuracy and completeness, and I agree with the above. -  Georgianne Fick, FNP

## 2021-05-06 DIAGNOSIS — Z03818 Encounter for observation for suspected exposure to other biological agents ruled out: Secondary | ICD-10-CM | POA: Diagnosis not present

## 2021-05-06 DIAGNOSIS — R52 Pain, unspecified: Secondary | ICD-10-CM | POA: Diagnosis not present

## 2021-05-06 DIAGNOSIS — R509 Fever, unspecified: Secondary | ICD-10-CM | POA: Diagnosis not present

## 2021-05-06 DIAGNOSIS — M5417 Radiculopathy, lumbosacral region: Secondary | ICD-10-CM | POA: Diagnosis not present

## 2021-05-24 ENCOUNTER — Encounter (INDEPENDENT_AMBULATORY_CARE_PROVIDER_SITE_OTHER): Payer: Self-pay | Admitting: Family Medicine

## 2021-05-24 ENCOUNTER — Other Ambulatory Visit: Payer: Self-pay

## 2021-05-24 ENCOUNTER — Ambulatory Visit (INDEPENDENT_AMBULATORY_CARE_PROVIDER_SITE_OTHER): Payer: BC Managed Care – PPO | Admitting: Family Medicine

## 2021-05-24 VITALS — BP 127/82 | HR 64 | Temp 97.8°F | Ht 67.0 in | Wt 237.0 lb

## 2021-05-24 DIAGNOSIS — E669 Obesity, unspecified: Secondary | ICD-10-CM | POA: Diagnosis not present

## 2021-05-24 DIAGNOSIS — R632 Polyphagia: Secondary | ICD-10-CM

## 2021-05-24 DIAGNOSIS — M5441 Lumbago with sciatica, right side: Secondary | ICD-10-CM

## 2021-05-24 DIAGNOSIS — R7303 Prediabetes: Secondary | ICD-10-CM | POA: Diagnosis not present

## 2021-05-24 DIAGNOSIS — Z6837 Body mass index (BMI) 37.0-37.9, adult: Secondary | ICD-10-CM

## 2021-05-24 MED ORDER — WEGOVY 2.4 MG/0.75ML ~~LOC~~ SOAJ: 2.4000 mg | SUBCUTANEOUS | 3 refills | Status: DC

## 2021-05-25 NOTE — Progress Notes (Signed)
Chief Complaint:   OBESITY [name] [name] is here to discuss her progress with her obesity treatment plan along with follow-up of her obesity related diagnoses. See Medical Weight Management Flowsheet for complete bioelectrical impedance results.  Today's visit was #: 18 Starting weight: 270 lbs Starting date: 03/19/2020 Weight change since last visit: 6 lbs Total lbs lost to date: 33 lbs Total weight loss percentage to date: -12.22%  Nutrition Plan: Keeping a food journal and adhering to recommended goals of 1200 calories and 80 grams of protein daily for 0% of the time. Activity: None. Anti-obesity medications: Wegovy 2.4 mg subcutaneously weekly. Reported side effects: None.  Interim History: [name] [name] had the flu and right-sided sciatica.  She is seeing a Restaurant manager, fast food.  She says she is still happy with her medication regimen.  Assessment/Plan:   1. Polyphagia Controlled. Current treatment: Wegovy 2.4 mg subcutaneously weekly.    Plan:  Continue Wegovy 2.4 mg subcutaneously weekly.  Will refill today, as per below.  She will continue to focus on protein-rich, low simple carbohydrate foods. We reviewed the importance of hydration, regular exercise for stress reduction, and restorative sleep.  - Refill Semaglutide-Weight Management (WEGOVY) 2.4 MG/0.75ML SOAJ; Inject 2.4 mg into the skin once a week.  Dispense: 9 mL; Refill: 3  2. Pre-diabetes Goal is HgbA1c < 5.7.  Medication: None.    Plan: She will continue to focus on protein-rich, low simple carbohydrate foods. We reviewed the importance of hydration, regular exercise for stress reduction, and restorative sleep.   3. Acute right-sided low back pain with right-sided sciatica [name] [name] is seeing a chiropractor for this.  Will follow along as it relates to her weight loss journey.  4. Obesity: Current BMI 37.2  Course: [name] [name] is currently in the action stage of change. As such, her goal is to continue with weight loss efforts.    Nutrition goals: She has agreed to keeping a food journal and adhering to recommended goals of 1200 calories and 80 grams of protein.   Exercise goals:  As is.  Behavioral modification strategies: increasing lean protein intake, decreasing simple carbohydrates, and increasing vegetables.  [name] [name] has agreed to follow-up with our clinic in 4 weeks. She was informed of the importance of frequent follow-up visits to maximize her success with intensive lifestyle modifications for her multiple health conditions.   Objective:   Blood pressure 127/82, pulse 64, temperature 97.8 F (36.6 C), temperature source Oral, height '5\' 7"'$  (1.702 m), weight 237 lb (107.5 kg), last menstrual period 03/11/2018, SpO2 96 %. Body mass index is 37.12 kg/m.  General: Cooperative, alert, well developed, in no acute distress. HEENT: Conjunctivae and lids unremarkable. Cardiovascular: Regular rhythm.  Lungs: Normal work of breathing. Neurologic: No focal deficits.   Lab Results  Component Value Date   CREATININE 1.0 02/10/2021   BUN 12 02/10/2021   NA 141 02/10/2021   K 4.2 02/10/2021   CL 102 02/10/2021   CO2 33 (A) 02/10/2021   Lab Results  Component Value Date   ALT 23 02/10/2021   AST 18 02/10/2021   ALKPHOS 26 02/10/2021   BILITOT 0.5 10/13/2020   Lab Results  Component Value Date   CHOL 242 (A) 02/10/2021   HDL 57 02/10/2021   LDLCALC 172 02/10/2021   TRIG 80 02/10/2021   Lab Results  Component Value Date   WBC 6.3 02/10/2021   HGB 14.1 02/10/2021   HCT 42 02/10/2021   MCV 88.0 10/13/2020   PLT 301 02/10/2021  Lab Results  Component Value Date   FERRITIN 151 02/10/2021   Attestation Statements:   Reviewed by clinician on day of visit: allergies, medications, problem list, medical history, surgical history, family history, social history, and previous encounter notes.  I, Water quality scientist, CMA, am acting as transcriptionist for Briscoe Deutscher, DO  I have reviewed the above  documentation for accuracy and completeness, and I agree with the above. -  Briscoe Deutscher, DO, MS, FAAFP, DABOM - Family and Bariatric Medicine.

## 2021-06-21 ENCOUNTER — Telehealth (INDEPENDENT_AMBULATORY_CARE_PROVIDER_SITE_OTHER): Payer: Self-pay

## 2021-06-21 ENCOUNTER — Encounter (INDEPENDENT_AMBULATORY_CARE_PROVIDER_SITE_OTHER): Payer: Self-pay | Admitting: Nurse Practitioner

## 2021-06-21 ENCOUNTER — Ambulatory Visit (INDEPENDENT_AMBULATORY_CARE_PROVIDER_SITE_OTHER): Payer: BC Managed Care – PPO | Admitting: Nurse Practitioner

## 2021-06-21 VITALS — BP 100/66 | HR 68 | Temp 97.9°F | Ht 67.0 in | Wt 238.0 lb

## 2021-06-21 DIAGNOSIS — R632 Polyphagia: Secondary | ICD-10-CM

## 2021-06-21 DIAGNOSIS — F3289 Other specified depressive episodes: Secondary | ICD-10-CM | POA: Diagnosis not present

## 2021-06-21 DIAGNOSIS — E669 Obesity, unspecified: Secondary | ICD-10-CM

## 2021-06-21 DIAGNOSIS — K219 Gastro-esophageal reflux disease without esophagitis: Secondary | ICD-10-CM

## 2021-06-21 DIAGNOSIS — Z6837 Body mass index (BMI) 37.0-37.9, adult: Secondary | ICD-10-CM

## 2021-06-21 DIAGNOSIS — F411 Generalized anxiety disorder: Secondary | ICD-10-CM

## 2021-06-21 MED ORDER — TOPIRAMATE 50 MG PO TABS: 50.0000 mg | ORAL_TABLET | Freq: Every day | ORAL | 0 refills | Status: DC

## 2021-06-21 NOTE — Telephone Encounter (Signed)
See my chart message

## 2021-06-23 NOTE — Progress Notes (Signed)
? ? ? ?Chief Complaint:  ? ?OBESITY ?[name] [name] is here to discuss her progress with her obesity treatment plan along with follow-up of her obesity related diagnoses. [name] [name] is on keeping a food journal and adhering to recommended goals of 1100-1600 calories and 50-60 grams of protein and states she is following her eating plan approximately 65-75% of the time. [name] [name] states she is doing 0 minutes 0 times per week. ? ?Today's visit was #: 19 ?Starting weight: 270 lbs ?Starting date: 03/19/2020 ?Today's weight: 238 lbs ?Today's date: 06/21/2021 ?Total lbs lost to date: 32 lbs ?Total lbs lost since last in-office visit: 0 ? ?Interim History: [name] [name] had a Lapband placed 2010. Her highest weight prior to band was 320 lbs and nadir weight was 208 lbs. She reports difficulties with swallowing meats since getting her Lapband. She struggles with GERD. She is currently taking Wegovy 2.4 mg. She notes some constipation. She hasn't been able to exercise due to job, weather and carrying for her husband. She's in a wedding next month. She is concerned about losing weight and not fitting in her dress. She is struggling with meeting protein goals. She states if she eats too much protein she will throw up.  ? ?Subjective:  ? ?1. Polyphagia ?[name] [name] is currently taking Wegovy 2.4 mg and Topamax 5 mg. She denies side effects.  ? ?2.  GERD ?[name] [name] is taking Prevacid twice weekly. She has struggled with GERD since having Lapband placed in 2010. ? ?3. Other depression, with emotional eating ?[name] [name] is currently taking Topamax 50 mg daily and Inderal 20 mg 1-2 times daily for anxiety. She denies side effects.  ? ?Assessment/Plan:  ? ?1. Polyphagia ?Intensive lifestyle modifications are the first line treatment for this issue. T[place]a will continue [facility] and Topamax as directed. We discussed several lifestyle modifications today and she will continue to work on diet, exercise and weight loss efforts. Orders and follow up as  documented in patient record. ? ?Counseling ?Polyphagia is excessive hunger. ?Causes can include: low blood sugars, hypERthyroidism, PMS, lack of sleep, stress, insulin resistance, diabetes, certain medications, and diets that are deficient in protein and fiber.   ? ?2. GERD ?Intensive lifestyle modifications are the first line treatment for this issue. We discussed several lifestyle modifications today and she will continue to work on diet, exercise and weight loss efforts. Orders and follow up as documented in patient record.  ? ?Counseling ?If a person has gastroesophageal reflux disease (GERD), food and stomach acid move back up into the esophagus and cause symptoms or problems such as damage to the esophagus. ?Anti-reflux measures include: raising the head of the bed, avoiding tight clothing or belts, avoiding eating late at night, not lying down shortly after mealtime, and achieving weight loss. ?Avoid ASA, NSAID's, caffeine, alcohol, and tobacco.  ?OTC Pepcid and/or Tums are often very helpful for as needed use.  ?However, for persisting chronic or daily symptoms, stronger medications like Omeprazole may be needed. ?You may need to avoid foods and drinks such as: ?Coffee and tea (with or without caffeine). ?Drinks that contain alcohol. ?Energy drinks and sports drinks. ?Bubbly (carbonated) drinks or sodas. ?Chocolate and cocoa. ?Peppermint and mint flavorings. ?Garlic and onions. ?Horseradish. ?Spicy and acidic foods. These include peppers, chili powder, curry powder, vinegar, hot sauces, and BBQ sauce. ?Citrus fruit juices and citrus fruits, such as oranges, lemons, and limes. ?Tomato-based foods. These include red sauce, chili, salsa, and pizza with red sauce. ?Fried and fatty foods. These include donuts, french fries,  potato chips, and high-fat dressings. ?High-fat meats. These include hot dogs, rib eye steak, sausage, ham, and bacon.  ? ?[name] [name] will continue to follow up with her primary care physician.  She will continue as directed.  ? ?3. Other depression, with emotional eating ?We will refill Topamax 50 mg for 3 months with no refills. Behavior modification techniques were discussed today to help [name] [name] deal with her emotional/non-hunger eating behaviors.  Orders and follow up as documented in patient record.  ? ?- topiramate (TOPAMAX) 50 MG tablet; Take 1 tablet (50 mg total) by mouth daily.  Dispense: 90 tablet; Refill: 0 ? ?4. Obesity: Current BMI 37.3 ?[name] [name] is currently in the action stage of change. As such, her goal is to continue with weight loss efforts. She has agreed to keeping a food journal and adhering to recommended goals of 1200 calories and 80 grams of protein.  ? ?Exercise goals: No exercise has been prescribed at this time. ? ?Behavioral modification strategies: increasing lean protein intake, increasing water intake, and no skipping meals. ? ?[name] [name] has agreed to follow-up with our clinic in 4 weeks. She was informed of the importance of frequent follow-up visits to maximize her success with intensive lifestyle modifications for her multiple health conditions.  ? ?Objective:  ? ?Blood pressure 100/66, pulse 68, temperature 97.9 ?F (36.6 ?C), height '5\' 7"'$  (1.702 m), weight 238 lb (108 kg), last menstrual period 03/11/2018, SpO2 98 %. ?Body mass index is 37.28 kg/m?. ? ?General: Cooperative, alert, well developed, in no acute distress. ?HEENT: Conjunctivae and lids unremarkable. ?Cardiovascular: Regular rhythm.  ?Lungs: Normal work of breathing. ?Neurologic: No focal deficits.  ? ?Lab Results  ?Component Value Date  ? CREATININE 1.0 02/10/2021  ? BUN 12 02/10/2021  ? NA 141 02/10/2021  ? K 4.2 02/10/2021  ? CL 102 02/10/2021  ? CO2 33 (A) 02/10/2021  ? ?Lab Results  ?Component Value Date  ? ALT 23 02/10/2021  ? AST 18 02/10/2021  ? ALKPHOS 26 02/10/2021  ? BILITOT 0.5 10/13/2020  ? ?No results found for: HGBA1C ?No results found for: INSULIN ?No results found for: TSH ?Lab Results   ?Component Value Date  ? CHOL 242 (A) 02/10/2021  ? HDL 57 02/10/2021  ? [place] 172 02/10/2021  ? TRIG 80 02/10/2021  ? ?No results found for: VD25OH ?Lab Results  ?Component Value Date  ? WBC 6.3 02/10/2021  ? HGB 14.1 02/10/2021  ? HCT 42 02/10/2021  ? MCV 88.0 10/13/2020  ? PLT 301 02/10/2021  ? ?Lab Results  ?Component Value Date  ? FERRITIN 151 02/10/2021  ? ?Attestation Statements:  ? ?Reviewed by clinician on day of visit: allergies, medications, problem list, medical history, surgical history, family history, social history, and previous encounter notes. ? ?I, Lizbeth Bark, RMA, am acting as Location manager for Everardo Pacific, FNP. ? ?I have reviewed the above documentation for accuracy and completeness, and I agree with the above. Everardo Pacific, FNP  ?

## 2021-07-19 ENCOUNTER — Encounter (INDEPENDENT_AMBULATORY_CARE_PROVIDER_SITE_OTHER): Payer: Self-pay | Admitting: Nurse Practitioner

## 2021-07-19 ENCOUNTER — Ambulatory Visit (INDEPENDENT_AMBULATORY_CARE_PROVIDER_SITE_OTHER): Payer: BC Managed Care – PPO | Admitting: Nurse Practitioner

## 2021-07-19 VITALS — BP 123/85 | HR 64 | Temp 97.9°F | Ht 67.0 in | Wt 241.0 lb

## 2021-07-19 DIAGNOSIS — E669 Obesity, unspecified: Secondary | ICD-10-CM | POA: Diagnosis not present

## 2021-07-19 DIAGNOSIS — Z6837 Body mass index (BMI) 37.0-37.9, adult: Secondary | ICD-10-CM | POA: Diagnosis not present

## 2021-07-19 DIAGNOSIS — R632 Polyphagia: Secondary | ICD-10-CM

## 2021-07-21 NOTE — Progress Notes (Signed)
? ? ? ?Chief Complaint:  ? ?OBESITY ?[name] [name] is here to discuss her progress with her obesity treatment plan along with follow-up of her obesity related diagnoses. [name] [name] is on keeping a food journal and adhering to recommended goals of 1200 calories and 50-60 grams of protein and states she is following her eating plan approximately 0% of the time. [name] [name] states she is walking for 40 minutes 2 times per week. ? ?Today's visit was #: 20 ?Starting weight: 270 lbs ?Starting date: 03/19/2020 ?Today's weight: 241 lbs ?Today's date: 07/19/2021 ?Total lbs lost to date: 29 lbs ?Total lbs lost since last in-office visit: 0 ? ?Interim History: [name] [name] reports last month has been stressful and will continued to be stressful over the next month. The more stressed she is, the more she craves sweets. She is taking Wegovy 2.4 mg. She reports side effects constipation, diarrhea, and gastrointestinal upset. She has seen a therapist for stress. She is drinking water daily.  ? ?Subjective:  ? ?1. Polyphagia ?[name] [name] is taking Wegovy 2.4 mg and Topamax 50 mg. Denies side effects. She is struggling with cravings, especially sweets.  ? ?Assessment/Plan:  ? ?1. Polyphagia ?Intensive lifestyle modifications are the first line treatment for this issue. We discussed decreasing Wegovy dose due to side effects and increasing Topamax 50 mg twice daily. She will stay at current dose of both. We discussed several lifestyle modifications today and she will continue to work on diet, exercise and weight loss efforts. Orders and follow up as documented in patient record. ? ?Counseling ?Polyphagia is excessive hunger. ?Causes can include: low blood sugars, hypERthyroidism, PMS, lack of sleep, stress, insulin resistance, diabetes, certain medications, and diets that are deficient in protein and fiber.   ? ?2. Obesity: Current BMI 37.8 ?[name] [name] is currently in the action stage of change. As such, her goal is to continue with weight loss efforts.  She has agreed to keeping a food journal and adhering to recommended goals of 1200-1300 calories and 85 plus grams of protein.  ? ?[name] [name] will keep a strict food journal and we will do IC at next visit.  ? ?Exercise goals:  As is.  ? ?Behavioral modification strategies: increasing lean protein intake, increasing water intake, no skipping meals, meal planning and cooking strategies, and keeping a strict food journal. ? ?[name] [name] has agreed to follow-up with our clinic in 4 weeks. She was informed of the importance of frequent follow-up visits to maximize her success with intensive lifestyle modifications for her multiple health conditions.  ? ?Objective:  ? ?Blood pressure 123/85, pulse 64, temperature 97.9 ?F (36.6 ?C), height '5\' 7"'$  (1.702 m), weight 241 lb (109.3 kg), last menstrual period 03/11/2018, SpO2 98 %. ?Body mass index is 37.75 kg/m?. ? ?General: Cooperative, alert, well developed, in no acute distress. ?HEENT: Conjunctivae and lids unremarkable. ?Cardiovascular: Regular rhythm.  ?Lungs: Normal work of breathing. ?Neurologic: No focal deficits.  ? ?Lab Results  ?Component Value Date  ? CREATININE 1.0 02/10/2021  ? BUN 12 02/10/2021  ? NA 141 02/10/2021  ? K 4.2 02/10/2021  ? CL 102 02/10/2021  ? CO2 33 (A) 02/10/2021  ? ?Lab Results  ?Component Value Date  ? ALT 23 02/10/2021  ? AST 18 02/10/2021  ? ALKPHOS 26 02/10/2021  ? BILITOT 0.5 10/13/2020  ? ?No results found for: HGBA1C ?No results found for: INSULIN ?No results found for: TSH ?Lab Results  ?Component Value Date  ? CHOL 242 (A) 02/10/2021  ? HDL 57 02/10/2021  ? [place]  172 02/10/2021  ? TRIG 80 02/10/2021  ? ?No results found for: VD25OH ?Lab Results  ?Component Value Date  ? WBC 6.3 02/10/2021  ? HGB 14.1 02/10/2021  ? HCT 42 02/10/2021  ? MCV 88.0 10/13/2020  ? PLT 301 02/10/2021  ? ?Lab Results  ?Component Value Date  ? FERRITIN 151 02/10/2021  ? ?Attestation Statements:  ? ?Reviewed by clinician on day of visit: allergies, medications,  problem list, medical history, surgical history, family history, social history, and previous encounter notes. ? ?Time spent on visit including pre-visit chart review and post-visit care and charting was 30 minutes.  ? ?I, Lizbeth Bark, RMA, am acting as Location manager for Everardo Pacific, FNP.  ? ?I have reviewed the above documentation for accuracy and completeness, and I agree with the above. Everardo Pacific, FNP  ?

## 2021-08-05 DIAGNOSIS — Z01419 Encounter for gynecological examination (general) (routine) without abnormal findings: Secondary | ICD-10-CM | POA: Diagnosis not present

## 2021-08-05 DIAGNOSIS — Z1389 Encounter for screening for other disorder: Secondary | ICD-10-CM | POA: Diagnosis not present

## 2021-08-23 ENCOUNTER — Ambulatory Visit (INDEPENDENT_AMBULATORY_CARE_PROVIDER_SITE_OTHER): Payer: BC Managed Care – PPO | Admitting: Nurse Practitioner

## 2021-08-23 ENCOUNTER — Encounter (INDEPENDENT_AMBULATORY_CARE_PROVIDER_SITE_OTHER): Payer: Self-pay | Admitting: Nurse Practitioner

## 2021-08-23 VITALS — BP 117/77 | HR 63 | Temp 97.9°F | Ht 67.0 in | Wt 242.0 lb

## 2021-08-23 DIAGNOSIS — Z6838 Body mass index (BMI) 38.0-38.9, adult: Secondary | ICD-10-CM | POA: Diagnosis not present

## 2021-08-23 DIAGNOSIS — R632 Polyphagia: Secondary | ICD-10-CM

## 2021-08-23 DIAGNOSIS — E669 Obesity, unspecified: Secondary | ICD-10-CM | POA: Diagnosis not present

## 2021-08-24 NOTE — Progress Notes (Unsigned)
Chief Complaint:   OBESITY [name] [name] is here to discuss her progress with her obesity treatment plan along with follow-up of her obesity related diagnoses. [name] [name] is on keeping a food journal and adhering to recommended goals of 1200-1300 calories and 85+ grams of protein daily and states she is following her eating plan approximately 75% of the time. [name] [name] states she is walking and lifting weights for 40 minutes 2-3 times per week.  Today's visit was #: 21 Starting weight: 270 lbs Starting date: 03/19/2020 Today's weight: 242 lbs Today's date: 08/23/2021 Total lbs lost to date: 28 Total lbs lost since last in-office visit: 0  Interim History: [name] [name]'s husband and son started working out with her today.  They plan to exercise together several days per week.  Plans to focus on her arms and walk on the treadmill.  Wants to focus on getting back on a routine.  Her son has diabetes mellitus and her husband has fatty liver disease.  She is taking Wegovy 2.4 mg.  She reports GI upset.  Has 5 more weeks of Wegovy at home and after that she would like to decrease her dose.  She reports a busy month.  When she tracks her calories they range between 1100-2000 cal and 30-92 grams of protein daily.  Recently started a protein water.  She is hungry with 1200 cal and she is snacking at night.  Does well with breakfast and lunch, but she struggles with after dinner.  Subjective:   1. Polyphagia [name] [name] is doing well with Wegovy 2 0.4 mg and Topamax 50 mg.  She denies side effects.  Assessment/Plan:   1. Polyphagia [name] [name] will continue Wegovy and Topamax.  Side effects were discussed.  2. Obesity: Current BMI 38.0 [name] [name] is currently in the action stage of change. As such, her goal is to continue with weight loss efforts. She has agreed to keeping a food journal and adhering to recommended goals of 1200-1300 calories and 85+ grams of protein daily.   Exercise goals: As is.   Behavioral  modification strategies: increasing lean protein intake, increasing water intake, and planning for success.  [name] [name] has agreed to follow-up with our clinic in 4 weeks. She was informed of the importance of frequent follow-up visits to maximize her success with intensive lifestyle modifications for her multiple health conditions.   Objective:   Blood pressure 117/77, pulse 63, temperature 97.9 F (36.6 C), height '5\' 7"'$  (1.702 m), weight 242 lb (109.8 kg), last menstrual period 03/11/2018, SpO2 97 %. Body mass index is 37.9 kg/m.  General: Cooperative, alert, well developed, in no acute distress. HEENT: Conjunctivae and lids unremarkable. Cardiovascular: Regular rhythm.  Lungs: Normal work of breathing. Neurologic: No focal deficits.   Lab Results  Component Value Date   CREATININE 1.0 02/10/2021   BUN 12 02/10/2021   NA 141 02/10/2021   K 4.2 02/10/2021   CL 102 02/10/2021   CO2 33 (A) 02/10/2021   Lab Results  Component Value Date   ALT 23 02/10/2021   AST 18 02/10/2021   ALKPHOS 26 02/10/2021   BILITOT 0.5 10/13/2020   No results found for: "HGBA1C" No results found for: "INSULIN" No results found for: "TSH" Lab Results  Component Value Date   CHOL 242 (A) 02/10/2021   HDL 57 02/10/2021   LDLCALC 172 02/10/2021   TRIG 80 02/10/2021   No results found for: "VD25OH" Lab Results  Component Value Date   WBC 6.3 02/10/2021   HGB  14.1 02/10/2021   HCT 42 02/10/2021   MCV 88.0 10/13/2020   PLT 301 02/10/2021   Lab Results  Component Value Date   FERRITIN 151 02/10/2021   Attestation Statements:   Reviewed by clinician on day of visit: allergies, medications, problem list, medical history, surgical history, family history, social history, and previous encounter notes.  Time spent on visit including pre-visit chart review and post-visit care and charting was 30 minutes.   [name] [name], am acting as Location manager for [employer], FNP-C.  I have  reviewed the above documentation for accuracy and completeness, and I agree with the above. [name] Pacific, FNP

## 2021-09-01 DIAGNOSIS — Z1231 Encounter for screening mammogram for malignant neoplasm of breast: Secondary | ICD-10-CM | POA: Diagnosis not present

## 2021-09-20 ENCOUNTER — Ambulatory Visit (INDEPENDENT_AMBULATORY_CARE_PROVIDER_SITE_OTHER): Payer: BC Managed Care – PPO | Admitting: Nurse Practitioner

## 2021-09-20 ENCOUNTER — Encounter (INDEPENDENT_AMBULATORY_CARE_PROVIDER_SITE_OTHER): Payer: Self-pay | Admitting: Nurse Practitioner

## 2021-09-20 VITALS — BP 109/72 | HR 63 | Temp 98.2°F | Ht 67.0 in | Wt 239.0 lb

## 2021-09-20 DIAGNOSIS — F3289 Other specified depressive episodes: Secondary | ICD-10-CM

## 2021-09-20 DIAGNOSIS — D509 Iron deficiency anemia, unspecified: Secondary | ICD-10-CM | POA: Diagnosis not present

## 2021-09-20 DIAGNOSIS — Z6837 Body mass index (BMI) 37.0-37.9, adult: Secondary | ICD-10-CM

## 2021-09-20 DIAGNOSIS — E785 Hyperlipidemia, unspecified: Secondary | ICD-10-CM

## 2021-09-20 DIAGNOSIS — R0602 Shortness of breath: Secondary | ICD-10-CM

## 2021-09-20 DIAGNOSIS — E669 Obesity, unspecified: Secondary | ICD-10-CM

## 2021-09-20 MED ORDER — TOPIRAMATE 50 MG PO TABS: 50.0000 mg | ORAL_TABLET | Freq: Two times a day (BID) | ORAL | 0 refills | Status: DC

## 2021-09-20 NOTE — Progress Notes (Signed)
Chief Complaint:   OBESITY [name] [name] is here to discuss her progress with her obesity treatment plan along with follow-up of her obesity related diagnoses. [name] [name] is on keeping a food journal and adhering to recommended goals of 1200-1300 calories and 85 protein and states she is following her eating plan approximately 50% of the time. [name] [name] states she is walking 30 minutes 3 times per week.  Today's visit was #: 22 Starting weight: 270 lbs Starting date: 03/19/2020 Today's weight: 239 lbs Today's date: 09/20/2021 Total lbs lost to date: 31 lbs Total lbs lost since last in-office visit: 3  Interim History: [name] [name] has done well with weight loss since her last visit. She is taking Wegovy 2.4 mg every 10 days due to GI upset. She is drinking water and protein water. She has a treadmill and weights at home. Denies hunger and craving since increasing in protein intake.   Subjective:   1. SOBOE (shortness of breath on exertion) Cooper's last IC was 03/19/20 at [place]. Today's IC 09/20/21 is 1426.  2. Hyperlipidemia, unspecified hyperlipidemia type [name] [name] has never been on any medications.   3. Iron deficiency anemia, unspecified iron deficiency anemia type [name] [name] is taking Iron every other day. Denies any side effects. If she does not take iron she notes fatigue. Had hysterectomy 2020. Her last colonoscopy, never had a EDG.  4. Other depression, with emotional eating [name] [name] taking Topamax 50 mg twice a day for the past 2 months. Denies any side effects. Help with cravings.   Assessment/Plan:   1. SOBOE (shortness of breath on exertion) We will obtain labs today.  - Comprehensive metabolic panel - TSH  2. Hyperlipidemia, unspecified hyperlipidemia type We will obtain labs today. Cardiovascular risk and specific lipid/LDL goals reviewed.  We discussed several lifestyle modifications today and [name] [name] will continue to work on diet, exercise and weight loss efforts.  Orders and follow up as documented in patient record.   Counseling Intensive lifestyle modifications are the first line treatment for this issue. Dietary changes: Increase soluble fiber. Decrease simple carbohydrates. Exercise changes: Moderate to vigorous-intensity aerobic activity 150 minutes per week if tolerated. Lipid-lowering medications: see documented in medical record.   - Comprehensive metabolic panel - Lipid Panel With LDL/HDL Ratio - TSH  3. Iron deficiency anemia, unspecified iron deficiency anemia type We will obtain labs today.  - Comprehensive metabolic panel - TSH - CBC with Differential/Platelet  4. Other depression, with emotional eating We will obtain labs today. We will refill Topamax 50 mg twice a day for 3 months with 0 refills.  -Refill topiramate (TOPAMAX) 50 MG tablet; Take 1 tablet (50 mg total) by mouth 2 (two) times daily.  Dispense: 180 tablet; Refill: 0  - Comprehensive metabolic panel - TSH  5. Obesity: Current BMI 37.5 [name] [name] is currently in the action stage of change. As such, her goal is to continue with weight loss efforts. She has agreed to keeping a food journal and adhering to recommended goals of 1400 calories and 80+ grams of protein.   Exercise goals: As is.  Behavioral modification strategies: increasing lean protein intake, increasing water intake, and planning for success.  [name] [name] has agreed to follow-up with our clinic in 4 weeks. She was informed of the importance of frequent follow-up visits to maximize her success with intensive lifestyle modifications for her multiple health conditions.   [name] [name] was informed we would discuss her lab results at her next visit unless there is a critical issue that needs  to be addressed sooner. [name] [name] agreed to keep her next visit at the agreed upon time to discuss these results.  Objective:   Blood pressure 109/72, pulse 63, temperature 98.2 F (36.8 C), height '5\' 7"'$  (1.702 m), weight  239 lb (108.4 kg), last menstrual period 03/11/2018, SpO2 96 %. Body mass index is 37.43 kg/m.  General: Cooperative, alert, well developed, in no acute distress. HEENT: Conjunctivae and lids unremarkable. Cardiovascular: Regular rhythm.  Lungs: Normal work of breathing. Neurologic: No focal deficits.   Lab Results  Component Value Date   CREATININE 1.0 02/10/2021   BUN 12 02/10/2021   NA 141 02/10/2021   K 4.2 02/10/2021   CL 102 02/10/2021   CO2 33 (A) 02/10/2021   Lab Results  Component Value Date   ALT 23 02/10/2021   AST 18 02/10/2021   ALKPHOS 26 02/10/2021   BILITOT 0.5 10/13/2020   No results found for: "HGBA1C" No results found for: "INSULIN" No results found for: "TSH" Lab Results  Component Value Date   CHOL 242 (A) 02/10/2021   HDL 57 02/10/2021   LDLCALC 172 02/10/2021   TRIG 80 02/10/2021   No results found for: "VD25OH" Lab Results  Component Value Date   WBC 6.3 02/10/2021   HGB 14.1 02/10/2021   HCT 42 02/10/2021   MCV 88.0 10/13/2020   PLT 301 02/10/2021   Lab Results  Component Value Date   FERRITIN 151 02/10/2021   Attestation Statements:   Reviewed by clinician on day of visit: allergies, medications, problem list, medical history, surgical history, family history, social history, and previous encounter notes.  I, Brendell Tyus, RMA, am acting as transcriptionist for Everardo Pacific, FNP.  I have reviewed the above documentation for accuracy and completeness, and I agree with the above. Everardo Pacific, FNP

## 2021-09-21 LAB — LIPID PANEL WITH LDL/HDL RATIO
Cholesterol, Total: 232 mg/dL — ABNORMAL HIGH (ref 100–199)
HDL: 59 mg/dL (ref >39)
LDL Chol Calc (NIH): 160 mg/dL — ABNORMAL HIGH (ref 0–99)
LDL/HDL Ratio: 2.7 ratio (ref 0.0–3.2)
Triglycerides: 76 mg/dL (ref 0–149)
VLDL Cholesterol Cal: 13 mg/dL (ref 5–40)

## 2021-09-21 LAB — CBC WITH DIFFERENTIAL/PLATELET
Basophils Absolute: 0 10*3/uL (ref 0.0–0.2)
Basos: 1 %
EOS (ABSOLUTE): 0.1 10*3/uL (ref 0.0–0.4)
Eos: 2 %
Hematocrit: 43.2 % (ref 34.0–46.6)
Hemoglobin: 13.9 g/dL (ref 11.1–15.9)
Immature Grans (Abs): 0 10*3/uL (ref 0.0–0.1)
Immature Granulocytes: 0 %
Lymphocytes Absolute: 1.7 10*3/uL (ref 0.7–3.1)
Lymphs: 33 %
MCH: 28.5 pg (ref 26.6–33.0)
MCHC: 32.2 g/dL (ref 31.5–35.7)
MCV: 89 fL (ref 79–97)
Monocytes Absolute: 0.3 10*3/uL (ref 0.1–0.9)
Monocytes: 6 %
Neutrophils Absolute: 3.1 10*3/uL (ref 1.4–7.0)
Neutrophils: 58 %
Platelets: 284 10*3/uL (ref 150–450)
RBC: 4.87 x10E6/uL (ref 3.77–5.28)
RDW: 13 % (ref 11.7–15.4)
WBC: 5.2 10*3/uL (ref 3.4–10.8)

## 2021-09-21 LAB — COMPREHENSIVE METABOLIC PANEL
ALT: 16 IU/L (ref 0–32)
AST: 17 IU/L (ref 0–40)
Albumin/Globulin Ratio: 1.7 (ref 1.2–2.2)
Albumin: 4.4 g/dL (ref 3.8–4.9)
Alkaline Phosphatase: 27 IU/L — ABNORMAL LOW (ref 44–121)
BUN/Creatinine Ratio: 15 (ref 9–23)
BUN: 13 mg/dL (ref 6–24)
Bilirubin Total: 0.4 mg/dL (ref 0.0–1.2)
CO2: 27 mmol/L (ref 20–29)
Calcium: 9.5 mg/dL (ref 8.7–10.2)
Chloride: 99 mmol/L (ref 96–106)
Creatinine, Ser: 0.89 mg/dL (ref 0.57–1.00)
Globulin, Total: 2.6 g/dL (ref 1.5–4.5)
Glucose: 80 mg/dL (ref 70–99)
Potassium: 3.9 mmol/L (ref 3.5–5.2)
Sodium: 140 mmol/L (ref 134–144)
Total Protein: 7 g/dL (ref 6.0–8.5)
eGFR: 76 mL/min/{1.73_m2} (ref >59)

## 2021-09-21 LAB — TSH: TSH: 1.7 u[IU]/mL (ref 0.450–4.500)

## 2021-10-20 ENCOUNTER — Encounter (INDEPENDENT_AMBULATORY_CARE_PROVIDER_SITE_OTHER): Payer: Self-pay

## 2021-10-25 ENCOUNTER — Ambulatory Visit (INDEPENDENT_AMBULATORY_CARE_PROVIDER_SITE_OTHER): Payer: BC Managed Care – PPO | Admitting: Nurse Practitioner

## 2021-10-25 ENCOUNTER — Encounter (INDEPENDENT_AMBULATORY_CARE_PROVIDER_SITE_OTHER): Payer: Self-pay | Admitting: Nurse Practitioner

## 2021-10-25 VITALS — BP 137/82 | HR 58 | Temp 98.5°F | Ht 67.0 in | Wt 239.0 lb

## 2021-10-25 DIAGNOSIS — Z6837 Body mass index (BMI) 37.0-37.9, adult: Secondary | ICD-10-CM

## 2021-10-25 DIAGNOSIS — E785 Hyperlipidemia, unspecified: Secondary | ICD-10-CM | POA: Diagnosis not present

## 2021-10-25 DIAGNOSIS — E669 Obesity, unspecified: Secondary | ICD-10-CM | POA: Diagnosis not present

## 2021-10-25 NOTE — Patient Instructions (Signed)
The 10-year ASCVD risk score (Arnett DK, et al., 2019) is: 3.7%   Values used to calculate the score:     Age: 56 years     Sex: Female     Is Non-Hispanic African American: No     Diabetic: No     Tobacco smoker: No     Systolic Blood Pressure: 125 mmHg     Is BP treated: Yes     HDL Cholesterol: 59 mg/dL     Total Cholesterol: 232 mg/dL

## 2021-11-01 NOTE — Progress Notes (Unsigned)
Chief Complaint:   OBESITY [name] [name] [name] is here to discuss her progress with her obesity treatment plan along with follow-up of her obesity related diagnoses. [name] [name] [name] is on keeping a food journal and adhering to recommended goals of 1200-1300 calories and 85 grams of protein and states she is following her eating plan approximately 0% of [name] [name] time. [name] [name] [name] states she is exrcising 0 minutes 0 times per week.  Today's visit was #: 23 Starting weight: 270 lbs Starting date: 03/19/2020 Today's weight: 239 lbs Today's date: 10/25/2021 Total lbs lost to date: 31 lbs Total lbs lost since last in-office visit: 0  Interim History: [name] [name] [name] is frustrated with not losing weight. She has been worried about her husband. She has tried to fit in exercise but has not been able to. Trying to make healthier choices and is meeting protein goals. Notes that she needs to find a gym and take classes or get a Physiological scientist. Feels overwhelmed with food choices. Struggles with taking time for herself and not being consistent. Took Contrave in [name] [name] pat and stopped due to side effects. She's also tried Phentermine in [name] [name] past.  Subjective:   1. Hyperlipidemia, unspecified hyperlipidemia type Labs discussed during visit today. Family history unknown. [name] [name] [name] has never been on medication.  Assessment/Plan:   1. Hyperlipidemia, unspecified hyperlipidemia type Cardiovascular risk and specific lipid/LDL goals reviewed.  We discussed several lifestyle modifications today and [name] [name] [name] will continue to work on diet, exercise and weight loss efforts. Orders and follow up as documented in patient record.   [name] [name] [name], [name] [name] al., 2019) is: 3.7%   Values used to calculate [name] [name] score:     Age: 56 years     Sex: Female     Is Non-Hispanic African American: No     Diabetic: No     Tobacco smoker: No     Systolic Blood Pressure: 536 mmHg     Is BP treated: Yes     HDL Cholesterol: 59 mg/dL      Total Cholesterol: 232 mg/dL  Counseling Intensive lifestyle modifications are [name] [name] first line treatment for this issue. Dietary changes: Increase soluble fiber. Decrease simple carbohydrates. Exercise changes: Moderate to vigorous-intensity aerobic activity 150 minutes per week if tolerated. Lipid-lowering medications: see documented in medical record. ASCVD reviewed with patient today.  2. Obesity: Current BMI 37.4 [name] [name] is currently in [name] [name] action stage of change. As such, her goal is to continue with weight loss efforts. She has agreed to keeping a food journal and adhering to recommended goals of 1200-1300 calories and 85 grams of protein.   Labs discussed during visit today. She is doing well on Wegovy 2.4 mg.  Exercise goals: As is.  Behavioral modification strategies: increasing lean protein intake, increasing water intake, and planning for success.  [name] [name] [name] has agreed to follow-up with our clinic in 4 weeks. She was informed of [name] [name] importance of frequent follow-up visits to maximize her success with intensive lifestyle modifications for her multiple health conditions.   Objective:   Blood pressure 137/82, pulse (!) 58, temperature 98.5 F (36.9 C), height '5\' 7"'$  (1.702 m), weight 239 lb (108.4 kg), last menstrual period 03/11/2018, SpO2 99 %. Body mass index is 37.43 kg/m.  General: Cooperative, alert, well developed, in no acute distress. HEENT: Conjunctivae and lids unremarkable. Cardiovascular: Regular rhythm.  Lungs: Normal work of breathing. Neurologic: No focal deficits.   Lab Results  Component Value Date   CREATININE 0.89 09/20/2021  BUN 13 09/20/2021   NA 140 09/20/2021   K 3.9 09/20/2021   CL 99 09/20/2021   CO2 27 09/20/2021   Lab Results  Component Value Date   ALT 16 09/20/2021   AST 17 09/20/2021   ALKPHOS 27 (L) 09/20/2021   BILITOT 0.4 09/20/2021   No results found for: "HGBA1C" No results found for: "INSULIN" Lab Results  Component Value  Date   TSH 1.700 09/20/2021   Lab Results  Component Value Date   CHOL 232 (H) 09/20/2021   HDL 59 09/20/2021   LDLCALC 160 (H) 09/20/2021   TRIG 76 09/20/2021   No results found for: "VD25OH" Lab Results  Component Value Date   WBC 5.2 09/20/2021   HGB 13.9 09/20/2021   HCT 43.2 09/20/2021   MCV 89 09/20/2021   PLT 284 09/20/2021   Lab Results  Component Value Date   FERRITIN 151 02/10/2021   Attestation Statements:   Reviewed by clinician on day of visit: allergies, medications, problem list, medical history, surgical history, family history, social history, and previous encounter notes.  I, Brendell Tyus, RMA, am acting as transcriptionist for Everardo Pacific, FNP.  I have reviewed [name] [name] above documentation for accuracy and completeness, and I agree with [name] [name] above. Everardo Pacific, FNP

## 2021-11-29 ENCOUNTER — Encounter (INDEPENDENT_AMBULATORY_CARE_PROVIDER_SITE_OTHER): Payer: Self-pay | Admitting: Family Medicine

## 2021-11-29 ENCOUNTER — Telehealth (INDEPENDENT_AMBULATORY_CARE_PROVIDER_SITE_OTHER): Payer: BC Managed Care – PPO | Admitting: Family Medicine

## 2021-11-29 DIAGNOSIS — Z6838 Body mass index (BMI) 38.0-38.9, adult: Secondary | ICD-10-CM | POA: Diagnosis not present

## 2021-11-29 DIAGNOSIS — Z6841 Body Mass Index (BMI) 40.0 and over, adult: Secondary | ICD-10-CM

## 2021-11-29 DIAGNOSIS — R632 Polyphagia: Secondary | ICD-10-CM | POA: Diagnosis not present

## 2021-11-29 DIAGNOSIS — F3289 Other specified depressive episodes: Secondary | ICD-10-CM | POA: Diagnosis not present

## 2021-11-29 DIAGNOSIS — E669 Obesity, unspecified: Secondary | ICD-10-CM

## 2021-11-29 NOTE — Progress Notes (Addendum)
TeleHealth Visit:  This visit was completed with telemedicine (audio/video) technology. [name] [name] has verbally consented to this TeleHealth visit. The patient is located at home, the provider is located at home. The participants in this visit include the listed provider and patient. The visit was conducted today via MyChart video.  OBESITY [name] [name] is here to discuss her progress with her obesity treatment plan along with follow-up of her obesity related diagnoses.   Today's visit was # 24 Starting weight: 270 lbs Starting date: 03/19/2020 Weight at last in office visit: 239 lbs on 10/25/21 Total weight loss: 31 lbs at last in office visit on 10/25/21. Today's reported weight: 241 lbs   Nutrition Plan: keeping a food journal and adhering to recommended goals of 1200-1300 calories and 85 gms protein.  Hunger is well controlled. Cravings are well controlled.  Current exercise: none  Interim History: [name] [name]'s weight has been essentially plateaued since December 2022.  She wonders if she should quit our program due to lack of progress.  I pointed out to her that weight maintenance is also a victory.    She has been dealing with her husband who is severely depressed over the past several months.  He has 4 appointments per week related to this-he sees a counselor twice weekly and gets Spravato twice weekly.  She feels like she is unable to focus on self-care-exercise or diet.  She is not journaling consistently but does tend to keep track of protein in her head.  This averages 80 to 120 g a day.  S she is very cognizant of protein intake and tries to make good food choices overall.  She reports having cravings- especially sweets. She feels like she eats too many calories at dinner.    She generally has protein oatmeal or eggs for breakfast and a frozen meal for lunch (at least 20 g of protein, less than 300 cal).  She has a lap band which was put in in 2010.  Preop weight was 285.  Nadir  210.  She does not currently have fluid in the band.  Discussed possible conversion to gastric bypass but she says her insurance will only cover 1 bariatric surgery.  Assessment/Plan:  1.  Other depression/emotional eating [name] [name] has had issues with stress/emotional eating. Currently this is poorly controlled. Overall mood is stable but she is very stressed. Medication(s): Citalopram 40 mg daily, topiramate 50 mg twice daily.  However she frequently forgets to take her evening dose of topiramate.  She really notices a difference when she takes it-it helps tremendously.  Plan: Continue citalopram 40 mg daily.  Continue topiramate 50 mg daily.   Discussed strategies for remembering the second dose of topiramate.  2. Polyphagia Well-controlled with Wegovy 2.4 mg.  She does experience cravings but these are managed if she takes topiramate as prescribed. Medication(s): Wegovy 2.4 mg weekly.    Plan: Continue Wegovy 2.4 mg weekly.  She has a year-long prescription prescribed by Dr. Briscoe Deutscher.   3. Obesity: Current BMI 38.05 [name] [name] is currently in the action stage of change. As such, her goal is to continue with weight loss efforts.  She has agreed to keeping a food journal and adhering to recommended goals of 1200-1300 calories and 80 protein.   Keep dinner calories less than 500.   Continue to monitor protein intake and get at least 80 g/day.  Exercise goals: No exercise has been prescribed at this time.  Behavioral modification strategies: increasing lean protein intake, decreasing simple carbohydrates, and  keeping a strict food journal.  [name] [name] has agreed to follow-up with our clinic in 4 weeks.  She will call [facility] to make an appointment with Colletta [place].  No orders of the defined types were placed in this encounter.   There are no discontinued medications.   No orders of the defined types were placed in this encounter.     Objective:   VITALS: Per patient if  applicable, see vitals. GENERAL: Alert and in no acute distress. CARDIOPULMONARY: No increased WOB. Speaking in clear sentences.  PSYCH: Pleasant and cooperative. Speech normal rate and rhythm. Affect is appropriate. Insight and judgement are appropriate. Attention is focused, linear, and appropriate.  NEURO: Oriented as arrived to appointment on time with no prompting.   Lab Results  Component Value Date   CREATININE 0.89 09/20/2021   BUN 13 09/20/2021   NA 140 09/20/2021   K 3.9 09/20/2021   CL 99 09/20/2021   CO2 27 09/20/2021   Lab Results  Component Value Date   ALT 16 09/20/2021   AST 17 09/20/2021   ALKPHOS 27 (L) 09/20/2021   BILITOT 0.4 09/20/2021   No results found for: "HGBA1C" No results found for: "INSULIN" Lab Results  Component Value Date   TSH 1.700 09/20/2021   Lab Results  Component Value Date   CHOL 232 (H) 09/20/2021   HDL 59 09/20/2021   LDLCALC 160 (H) 09/20/2021   TRIG 76 09/20/2021   Lab Results  Component Value Date   WBC 5.2 09/20/2021   HGB 13.9 09/20/2021   HCT 43.2 09/20/2021   MCV 89 09/20/2021   PLT 284 09/20/2021   Lab Results  Component Value Date   FERRITIN 151 02/10/2021   No results found for: "VD25OH"  Attestation Statements:   Reviewed by clinician on day of visit: allergies, medications, problem list, medical history, surgical history, family history, social history, and previous encounter notes.  Time spent on visit including the items listed below was 32 minutes.  -preparing to see the patient (e.g., review of tests, history, previous notes) -obtaining and/or reviewing separately obtained history -counseling and educating the patient/family/caregiver -documenting clinical information in the electronic or other health record

## 2022-01-03 ENCOUNTER — Ambulatory Visit: Payer: BC Managed Care – PPO | Admitting: Nurse Practitioner

## 2022-01-03 ENCOUNTER — Encounter: Payer: Self-pay | Admitting: Nurse Practitioner

## 2022-01-03 VITALS — BP 118/77 | HR 66 | Temp 97.8°F | Ht 67.0 in | Wt 238.0 lb

## 2022-01-03 DIAGNOSIS — F3289 Other specified depressive episodes: Secondary | ICD-10-CM

## 2022-01-03 DIAGNOSIS — E669 Obesity, unspecified: Secondary | ICD-10-CM | POA: Diagnosis not present

## 2022-01-03 DIAGNOSIS — Z6837 Body mass index (BMI) 37.0-37.9, adult: Secondary | ICD-10-CM | POA: Diagnosis not present

## 2022-01-03 MED ORDER — TOPIRAMATE 50 MG PO TABS: 50.0000 mg | ORAL_TABLET | Freq: Two times a day (BID) | ORAL | 0 refills | Status: AC

## 2022-01-03 NOTE — Progress Notes (Signed)
Chief Complaint:   OBESITY Xyla is here to discuss her progress with her obesity treatment plan along with follow-up of her obesity related diagnoses. Ariza is on keeping a food journal and adhering to recommended goals of 1200-1300 calories and aiming to eat more protein and states she is following her eating plan approximately 0% of the time. Ziaire states she is not exercising.  Today's visit was #: 25 Starting weight: 270 lbs Starting date: 03/19/2020 Today's weight: 239 lbs Today's date: 01/03/2022 Total lbs lost to date: 31 lbs Total lbs lost since last in-office visit: lost 1 lbs  Interim History: Her husband is currently sick and has been on several rounds of antibiotics.  She notes she hasn't had time to focus on herself . Her husband's depression is doing better. She is focusing on eating more protein with each meal.  Snacks are protein bars. BF: yogurt, protein bar, eggs, protein shakes; lunch:  frozen meal and dinner: protein + veggies.  She is drinking water, protein shake (couple days per week), protein water, water, coffee and occ a glass of wine.   She is taking Wegovy 2.'4mg'$ .  Reports side effects of constipation.  Takes a stool softener every couple of weeks. Mancel Parsons has helped with cravings. She has tried Contrave and Phentermine in the past for medical weight loss. She stopped Contrave due to side effects of migraine.  She didn't feel the Phentermine was beneficial.  She also took fen-phen in the past.    Subjective:   1. Other depression, with emotional eating Rindy taking Topamax 50 mg twice a day. Denies any side effects. Helps with cravings.  Needs refill.  2. Obesity with BMI 37.3 [name] has struggled with her weight her entire life.  She started gaining weight in kindergarten and her heaviest weight was at 280 pounds. She has tried Contrave and Phentermine in the past for medical weight loss. She stopped Contrave due to side effects of migraine.  She  didn't feel the Phentermine was beneficial.  She also took fen-phen in the past.   Assessment/Plan:   1. Other depression, with emotional eating Doing well with Topamax 50 mg twice daily.  Side effects discussed. - topiramate (TOPAMAX) 50 MG tablet; Take 1 tablet (50 mg total) by mouth 2 (two) times daily. Refill for 3 month supply.   2. Obesity: Current BMI 37.3  [place] is currently in the action stage of change. As such, her goal is to continue with weight loss efforts. She has agreed to keeping a food journal and adhering to recommended goals of 1200-1300 calories and 85+ protein.     Continue Wegovy 2.'4mg'$ .  Side effects discussed   Behavioral modification strategies: increasing vegetables, increasing water intake, and emotional eating strategies. Plans to start walking with her husband once he is feeling better.   Torri has agreed to follow-up with our clinic in 4-5 weeks. She was informed of the importance of frequent follow-up visits to maximize her success with intensive lifestyle modifications for her multiple health conditions.   Objective:   Blood pressure 118/77, pulse 66, temperature 97.8 F (36.6 C), height '5\' 7"'$  (1.702 m), weight 238 lb (108 kg), last menstrual period 03/11/2018, SpO2 99 %. Body mass index is 37.28 kg/m.  General: Cooperative, alert, well developed, in no acute distress. HEENT: Conjunctivae and lids unremarkable. Cardiovascular: Regular rhythm.  Lungs: Normal work of breathing. Neurologic: No focal deficits.   Lab Results  Component Value Date   CREATININE 0.89 09/20/2021  BUN 13 09/20/2021   NA 140 09/20/2021   K 3.9 09/20/2021   CL 99 09/20/2021   CO2 27 09/20/2021   Lab Results  Component Value Date   ALT 16 09/20/2021   AST 17 09/20/2021   ALKPHOS 27 (L) 09/20/2021   BILITOT 0.4 09/20/2021   No results found for: "HGBA1C" No results found for: "INSULIN" Lab Results  Component Value Date   TSH 1.700 09/20/2021   Lab Results   Component Value Date   CHOL 232 (H) 09/20/2021   HDL 59 09/20/2021   LDLCALC 160 (H) 09/20/2021   TRIG 76 09/20/2021   No results found for: "VD25OH" Lab Results  Component Value Date   WBC 5.2 09/20/2021   HGB 13.9 09/20/2021   HCT 43.2 09/20/2021   MCV 89 09/20/2021   PLT 284 09/20/2021   Lab Results  Component Value Date   FERRITIN 151 02/10/2021    Obesity Behavioral Intervention:   Approximately 15 minutes were spent on the discussion below.  ASK: We discussed the diagnosis of obesity with [name] today and [name] agreed to give [place] permission to discuss obesity behavioral modification therapy today.  ASSESS: Reda has the diagnosis of obesity and her BMI today is 37.3. Yaquelin is in the action stage of change.   ADVISE: Briele was educated on the multiple health risks of obesity as well as the benefit of weight loss to improve her health. She was advised of the need for long term treatment and the importance of lifestyle modifications to improve her current health and to decrease her risk of future health problems.  AGREE: Multiple dietary modification options and treatment options were discussed and Carole agreed to follow the recommendations documented in the above note.  ARRANGE: Kleo was educated on the importance of frequent visits to treat obesity as outlined per CMS and USPSTF guidelines and agreed to schedule her next follow up appointment today.  Attestation Statements:   Reviewed by clinician on day of visit: allergies, medications, problem list, medical history, surgical history, family history, social history, and previous encounter notes.  I have reviewed the above documentation for accuracy and completeness, and I agree with the above. Everardo Pacific, FNP

## 2022-02-02 DIAGNOSIS — M25562 Pain in left knee: Secondary | ICD-10-CM | POA: Diagnosis not present

## 2022-02-07 ENCOUNTER — Encounter: Payer: Self-pay | Admitting: Nurse Practitioner

## 2022-02-07 ENCOUNTER — Ambulatory Visit: Payer: BC Managed Care – PPO | Admitting: Nurse Practitioner

## 2022-02-07 VITALS — BP 120/83 | HR 55 | Temp 98.0°F | Ht 67.0 in | Wt 235.0 lb

## 2022-02-07 DIAGNOSIS — E785 Hyperlipidemia, unspecified: Secondary | ICD-10-CM | POA: Diagnosis not present

## 2022-02-07 DIAGNOSIS — E669 Obesity, unspecified: Secondary | ICD-10-CM

## 2022-02-07 DIAGNOSIS — Z6836 Body mass index (BMI) 36.0-36.9, adult: Secondary | ICD-10-CM | POA: Diagnosis not present

## 2022-02-15 NOTE — Progress Notes (Unsigned)
Chief Complaint:   OBESITY [name] [name] is here to discuss her progress with her obesity treatment plan along with follow-up of her obesity related diagnoses. [name] [name] is on no meal plan/increased protein and states she is following her eating plan approximately 80% of the time. [name] [name] states she is exercising 0 minutes 0 times per week.  Today's visit was #: 26 Starting weight: 270 lbs Starting date: 03/19/2020 Today's weight: 235 lbs Today's date: 02/07/2022 Total lbs lost to date: 35 Total lbs lost since last in-office visit: 4  Interim History: [name] [name] has done well with weight loss since her last visit. Aiming to eat more protein--eating at least 20 grams with each meal. Has been struggling with knee pain and saw Orthro recently. She has been more active since her last visit. Had a cortisone injection, and felt it was beneficial. Taking Wegovy 2.4 mg. Denies any side effects.  Subjective:   1. Hyperlipidemia, unspecified hyperlipidemia type [name] [name] has never been on medications. Family history: unknown.  Assessment/Plan:   1. Hyperlipidemia, unspecified hyperlipidemia type Cardiovascular risk and specific lipid/LDL goals reviewed.  We discussed several lifestyle modifications today and [name] [name] will continue to work on diet, exercise and weight loss efforts. Orders and follow up as documented in patient record.   The 10-year ASCVD risk score (Arnett DK, et al., 2019) is: 3.1%   Values used to calculate the score:     Age: 56 years     Sex: Female     Is Non-Hispanic African American: No     Diabetic: No     Tobacco smoker: No     Systolic Blood Pressure: 540 mmHg     Is BP treated: Yes     HDL Cholesterol: 59 mg/dL     Total Cholesterol: 232 mg/dL  Counseling Intensive lifestyle modifications are the first line treatment for this issue. Dietary changes: Increase soluble fiber. Decrease simple carbohydrates. Exercise changes: Moderate to vigorous-intensity aerobic  activity 150 minutes per week if tolerated. Lipid-lowering medications: see documented in medical record.   2. Obesity: Current BMI 36.9 [name] [name] is currently in the action stage of change. As such, her goal is to continue with weight loss efforts. She has agreed to practicing portion control and making smarter food choices, such as increasing vegetables and decreasing simple carbohydrates.   Exercise goals: All adults should avoid inactivity. Some physical activity is better than none, and adults who participate in any amount of physical activity gain some health benefits.  Behavioral modification strategies: increasing lean protein intake, increasing water intake, no skipping meals, meal planning and cooking strategies, and holiday eating strategies .  [name] [name] has agreed to follow-up with our clinic in 7 weeks. She was informed of the importance of frequent follow-up visits to maximize her success with intensive lifestyle modifications for her multiple health conditions.   Objective:   Blood pressure 120/83, pulse (!) 55, temperature 98 F (36.7 C), height '5\' 7"'$  (1.702 m), weight 235 lb (106.6 kg), last menstrual period 03/11/2018, SpO2 98 %. Body mass index is 36.81 kg/m.  General: Cooperative, alert, well developed, in no acute distress. HEENT: Conjunctivae and lids unremarkable. Cardiovascular: Regular rhythm.  Lungs: Normal work of breathing. Neurologic: No focal deficits.   Lab Results  Component Value Date   CREATININE 0.89 09/20/2021   BUN 13 09/20/2021   NA 140 09/20/2021   K 3.9 09/20/2021   CL 99 09/20/2021   CO2 27 09/20/2021   Lab Results  Component Value Date  ALT 16 09/20/2021   AST 17 09/20/2021   ALKPHOS 27 (L) 09/20/2021   BILITOT 0.4 09/20/2021   No results found for: "HGBA1C" No results found for: "INSULIN" Lab Results  Component Value Date   TSH 1.700 09/20/2021   Lab Results  Component Value Date   CHOL 232 (H) 09/20/2021   HDL 59 09/20/2021    LDLCALC 160 (H) 09/20/2021   TRIG 76 09/20/2021   No results found for: "VD25OH" Lab Results  Component Value Date   WBC 5.2 09/20/2021   HGB 13.9 09/20/2021   HCT 43.2 09/20/2021   MCV 89 09/20/2021   PLT 284 09/20/2021   Lab Results  Component Value Date   FERRITIN 151 02/10/2021   Attestation Statements:   Reviewed by clinician on day of visit: allergies, medications, problem list, medical history, surgical history, family history, social history, and previous encounter notes.  Time spent on visit including pre-visit chart review and post-visit care and charting was 30 minutes.   I, Brendell Tyus, RMA, am acting as transcriptionist for Everardo Pacific, FNP.  I have reviewed the above documentation for accuracy and completeness, and I agree with the above. Everardo Pacific, FNP

## 2022-02-16 DIAGNOSIS — I10 Essential (primary) hypertension: Secondary | ICD-10-CM | POA: Diagnosis not present

## 2022-02-16 DIAGNOSIS — Z8249 Family history of ischemic heart disease and other diseases of the circulatory system: Secondary | ICD-10-CM | POA: Diagnosis not present

## 2022-02-16 DIAGNOSIS — Z87891 Personal history of nicotine dependence: Secondary | ICD-10-CM | POA: Diagnosis not present

## 2022-02-16 DIAGNOSIS — Z6837 Body mass index (BMI) 37.0-37.9, adult: Secondary | ICD-10-CM | POA: Diagnosis not present

## 2022-02-16 DIAGNOSIS — Z0000 Encounter for general adult medical examination without abnormal findings: Secondary | ICD-10-CM | POA: Diagnosis not present

## 2022-02-16 DIAGNOSIS — Z23 Encounter for immunization: Secondary | ICD-10-CM | POA: Diagnosis not present

## 2022-02-16 DIAGNOSIS — Z13220 Encounter for screening for lipoid disorders: Secondary | ICD-10-CM | POA: Diagnosis not present

## 2022-02-16 DIAGNOSIS — M25562 Pain in left knee: Secondary | ICD-10-CM | POA: Diagnosis not present

## 2022-02-16 NOTE — Patient Instructions (Signed)
The 10-year ASCVD risk score (Arnett DK, et al., 2019) is: 3.1%   Values used to calculate the score:     Age: 56 years     Sex: Female     Is Non-Hispanic African American: No     Diabetic: No     Tobacco smoker: No     Systolic Blood Pressure: 734 mmHg     Is BP treated: Yes     HDL Cholesterol: 59 mg/dL     Total Cholesterol: 232 mg/dL

## 2022-02-18 ENCOUNTER — Other Ambulatory Visit: Payer: Self-pay | Admitting: Cardiology

## 2022-03-18 DIAGNOSIS — M1712 Unilateral primary osteoarthritis, left knee: Secondary | ICD-10-CM | POA: Diagnosis not present

## 2022-03-28 ENCOUNTER — Encounter: Payer: Self-pay | Admitting: Nurse Practitioner

## 2022-03-28 ENCOUNTER — Ambulatory Visit: Payer: BC Managed Care – PPO | Admitting: Nurse Practitioner

## 2022-03-28 VITALS — BP 123/84 | HR 64 | Temp 98.2°F | Ht 67.0 in | Wt 239.0 lb

## 2022-03-28 DIAGNOSIS — F411 Generalized anxiety disorder: Secondary | ICD-10-CM | POA: Diagnosis not present

## 2022-03-28 DIAGNOSIS — E669 Obesity, unspecified: Secondary | ICD-10-CM | POA: Diagnosis not present

## 2022-03-28 DIAGNOSIS — Z6837 Body mass index (BMI) 37.0-37.9, adult: Secondary | ICD-10-CM | POA: Diagnosis not present

## 2022-03-28 MED ORDER — PROPRANOLOL HCL 20 MG PO TABS: 20.0000 mg | ORAL_TABLET | Freq: Three times a day (TID) | ORAL | 0 refills | Status: AC

## 2022-03-28 MED ORDER — ZEPBOUND 5 MG/0.5ML ~~LOC~~ SOAJ: 5.0000 mg | SUBCUTANEOUS | 0 refills | Status: DC

## 2022-03-28 NOTE — Patient Instructions (Signed)

## 2022-03-31 ENCOUNTER — Telehealth: Payer: Self-pay | Admitting: Nurse Practitioner

## 2022-03-31 ENCOUNTER — Encounter: Payer: Self-pay | Admitting: Nurse Practitioner

## 2022-03-31 NOTE — Telephone Encounter (Signed)
QUENNA DOEPKE (Key: [id number])  This request has been approved.  Please note any additional information provided by [employer] at the bottom of your screen.

## 2022-04-05 NOTE — Progress Notes (Signed)
Chief Complaint:   OBESITY [name] [name] is here to discuss her progress with her obesity treatment plan along with follow-up of her obesity related diagnoses. [name] [name] is on practicing portion control and making smarter food choices, such as increasing vegetables and decreasing simple carbohydrates and states she is following her eating plan approximately 0% of the time. [name] [name] states she is riding bike/rowing/using hand weights 30-40 minutes 3 times per week.  Today's visit was #: 22 Starting weight: 270 lbs Starting date: 03/19/2020 Today's weight: 239 lbs Today's date: 03/28/2022 Total lbs lost to date: 31 lbs Total lbs lost since last in-office visit: 0  Interim History: [name] [name] celebrated the holidays since her last visit.  Got a bike and a Primary school teacher for Christmas.  She has taken Contrave, Phentermine and is currently taking Topamax.  Subjective:   1. GAD (generalized anxiety disorder) [name] [name] is taking Inderal 20 mg once daily as needed.  Denies any side effects.  Assessment/Plan:   1. GAD (generalized anxiety disorder) Will refill Inderal 20 mg three times a day as needed for 1 month with 0 refills.  Side effects discussed.   -Refill propranolol (INDERAL) 20 MG tablet; Take 1 tablet (20 mg total) by mouth 3 (three) times daily.  Dispense: 270 tablet; Refill: 0  2. Obesity: Current BMI 37.6 Stop Wegovy and Start Zebound 5 mg SQ once a week for 1 month with 0 refills.  Side effects discussed.   Contraindications: patient denies  Pancreatitis (active gallstones) Medullary thyroid cancer High triglycerides (>500)-will need labs prior to starting Multiple Endocrine Neoplasia syndrome type 2 (MEN 2) Trying to get pregnant Breastfeeding Use with caution with taking insulin or sulfonylureas (will need to monitor blood sugars for hypoglycemia)  -Start tirzepatide (ZEPBOUND) 5 MG/0.5ML Pen; Inject 5 mg into the skin once a week.  Dispense: 2 mL; Refill: 0  [name] [name] is  currently in the action stage of change. As such, her goal is to continue with weight loss efforts. She has agreed to practicing portion control and making smarter food choices, such as increasing vegetables and decreasing simple carbohydrates.   Exercise goals: As is.  Behavioral modification strategies: increasing lean protein intake, increasing vegetables, and increasing water intake.  [name] [name] has agreed to follow-up with our clinic in 4 weeks. She was informed of the importance of frequent follow-up visits to maximize her success with intensive lifestyle modifications for her multiple health conditions.   Objective:   Blood pressure 123/84, pulse 64, temperature 98.2 F (36.8 C), height '5\' 7"'$  (1.702 m), weight 239 lb (108.4 kg), last menstrual period 03/11/2018, SpO2 97 %. Body mass index is 37.43 kg/m.  General: Cooperative, alert, well developed, in no acute distress. HEENT: Conjunctivae and lids unremarkable. Cardiovascular: Regular rhythm.  Lungs: Normal work of breathing. Neurologic: No focal deficits.   Lab Results  Component Value Date   CREATININE 0.89 09/20/2021   BUN 13 09/20/2021   NA 140 09/20/2021   K 3.9 09/20/2021   CL 99 09/20/2021   CO2 27 09/20/2021   Lab Results  Component Value Date   ALT 16 09/20/2021   AST 17 09/20/2021   ALKPHOS 27 (L) 09/20/2021   BILITOT 0.4 09/20/2021   No results found for: "HGBA1C" No results found for: "INSULIN" Lab Results  Component Value Date   TSH 1.700 09/20/2021   Lab Results  Component Value Date   CHOL 232 (H) 09/20/2021   HDL 59 09/20/2021   LDLCALC 160 (H) 09/20/2021   TRIG 76  09/20/2021   No results found for: "VD25OH" Lab Results  Component Value Date   WBC 5.2 09/20/2021   HGB 13.9 09/20/2021   HCT 43.2 09/20/2021   MCV 89 09/20/2021   PLT 284 09/20/2021   Lab Results  Component Value Date   FERRITIN 151 02/10/2021   Attestation Statements:   Reviewed by clinician on day of visit:  allergies, medications, problem list, medical history, surgical history, family history, social history, and previous encounter notes.  I, Brendell Tyus, RMA, am acting as transcriptionist for Everardo Pacific, FNP.  I have reviewed the above documentation for accuracy and completeness, and I agree with the above. Everardo Pacific, FNP

## 2022-04-25 ENCOUNTER — Ambulatory Visit: Payer: BC Managed Care – PPO | Admitting: Nurse Practitioner

## 2022-04-25 ENCOUNTER — Encounter: Payer: Self-pay | Admitting: Nurse Practitioner

## 2022-04-25 VITALS — BP 122/82 | HR 59 | Temp 97.9°F | Ht 67.0 in | Wt 241.0 lb

## 2022-04-25 DIAGNOSIS — Z6837 Body mass index (BMI) 37.0-37.9, adult: Secondary | ICD-10-CM

## 2022-04-25 DIAGNOSIS — R5383 Other fatigue: Secondary | ICD-10-CM

## 2022-04-25 MED ORDER — ZEPBOUND 7.5 MG/0.5ML ~~LOC~~ SOAJ
7.5000 mg | SUBCUTANEOUS | 0 refills | Status: DC
Start: 2022-04-25 — End: 2022-04-26

## 2022-04-25 NOTE — Progress Notes (Signed)
Office: [phone]  /  Fax: [phone]  WEIGHT SUMMARY AND BIOMETRICS  Medical Weight Loss Height: 5' 7"$  (1.702 m) Weight: 241 lb (109.3 kg) Temp: 97.9 F (36.6 C) Pulse Rate: (!) 59 BP: 122/82 SpO2: 97 % Today's Visit #: 28 Weight at Last VIsit: 239lb Weight Lost Since Last Visit: Gained 2  Body Fat %: 48.5 % Fat Mass (lbs): 117.2 lbs Muscle Mass (lbs): 118.2 lbs Total Body Water (lbs): 90 lbs Visceral Fat Rating : 14 Starting Date: 03/19/20 Starting Weight: 270lb Total Weight Loss (lbs): 29 lb (13.2 kg)    HPI  Chief Complaint: OBESITY  Toniya is here to discuss her progress with her obesity treatment plan. She is on the keeping a food journal and adhering to recommended goals of 1600 calories and 80-100 protein and states she is following her eating plan approximately 50 % of the time. She states she is exercising 30 minutes 2 times per week-rowing machine    Interval History:  Since last office visit she has gained 2 lbs since her last visit.  She is taking Zepbound 66m and Topamax 533mBID.  Denies side effects.  She is aiming to eat more protein-around 90 grams.  She is averaging around 1600-1800 calories.  Struggles with eating <1600 calories.     Pharmacotherapy: She is taking Zepbound 103m39mnd Topamax 58m24mD.  Denies side effects.    Previous pharmacotherapy: She has tried WegoMalithe past and stopped due to side effects of constipation and GI upset.  She has taken Contrave and stopped due to side effects of migraine.  She has also tried Phentermine in the past.   Bariatric surgery:  Patient is status post Lapband by Dr. NewmLucia Gaskins2010.  Her highest weight prior to surgery was 325 lbs and she nadir weight after surgery was 208 lbs.  she is taking MV and iron.    PHYSICAL EXAM:  Blood pressure 122/82, pulse (!) 59, temperature 97.9 F (36.6 C), height 5' 7"$  (1.702 m), weight 241 lb (109.3 kg), last menstrual period 03/11/2018, SpO2 97 %. Body mass  index is 37.75 kg/m.  General: She is overweight, cooperative, alert, well developed, and in no acute distress. PSYCH: Has normal mood, affect and thought process.   Extremities: No edema.  Neurologic: No gross sensory or motor deficits. No tremors or fasciculations noted.    DIAGNOSTIC DATA REVIEWED:  BMET    Component Value Date/Time   NA 140 09/20/2021 0903   NA 141 11/22/2016 0854   K 3.9 09/20/2021 0903   K 4.0 11/22/2016 0854   CL 99 09/20/2021 0903   CL 105 05/01/2012 1044   CO2 27 09/20/2021 0903   CO2 29 11/22/2016 0854   GLUCOSE 80 09/20/2021 0903   GLUCOSE 89 10/13/2020 0833   GLUCOSE 91 11/22/2016 0854   GLUCOSE 94 05/01/2012 1044   BUN 13 09/20/2021 0903   BUN 8.8 11/22/2016 0854   CREATININE 0.89 09/20/2021 0903   CREATININE 0.91 11/22/2017 0820   CREATININE 0.8 11/22/2016 0854   CALCIUM 9.5 09/20/2021 0903   CALCIUM 9.4 11/22/2016 0854   GFRNONAA >60 10/13/2020 0833   GFRNONAA >60 11/22/2017 0820   GFRAA 102 02/27/2020 1207   GFRAA >60 11/22/2017 0820   No results found for: "HGBA1C" No results found for: "INSULIN" Lab Results  Component Value Date   TSH 1.700 09/20/2021   CBC    Component Value Date/Time   WBC 5.2 09/20/2021 0903   WBC 6.3 10/13/2020  0833   RBC 4.87 09/20/2021 0903   RBC 4.8 02/10/2021 0000   HGB 13.9 09/20/2021 0903   HGB 12.2 11/22/2016 0854   HCT 43.2 09/20/2021 0903   HCT 37.9 11/22/2016 0854   PLT 284 09/20/2021 0903   MCV 89 09/20/2021 0903   MCV 88.8 11/22/2016 0854   MCH 28.5 09/20/2021 0903   MCH 29.4 10/13/2020 0833   MCHC 32.2 09/20/2021 0903   MCHC 33.4 10/13/2020 0833   RDW 13.0 09/20/2021 0903   RDW 13.3 11/22/2016 0854   Iron Studies    Component Value Date/Time   FERRITIN 151 02/10/2021 0000   Lipid Panel     Component Value Date/Time   CHOL 232 (H) 09/20/2021 0903   TRIG 76 09/20/2021 0903   HDL 59 09/20/2021 0903   LDLCALC 160 (H) 09/20/2021 0903   Hepatic Function Panel     Component  Value Date/Time   PROT 7.0 09/20/2021 0903   PROT 7.2 11/22/2016 0854   ALBUMIN 4.4 09/20/2021 0903   ALBUMIN 3.6 11/22/2016 0854   AST 17 09/20/2021 0903   AST 15 11/22/2017 0820   AST 15 11/22/2016 0854   ALT 16 09/20/2021 0903   ALT 12 11/22/2017 0820   ALT 12 11/22/2016 0854   ALKPHOS 27 (L) 09/20/2021 0903   ALKPHOS 17 (L) 11/22/2016 0854   BILITOT 0.4 09/20/2021 0903   BILITOT 0.3 11/22/2017 0820   BILITOT 0.42 11/22/2016 0854   BILIDIR <0.10 03/27/2020 1626      Component Value Date/Time   TSH 1.700 09/20/2021 0903   Nutritional No results found for: "VD25OH"   ASSESSMENT AND PLAN  TREATMENT PLAN FOR OBESITY:  Recommended Dietary Goals  Analeigh is currently in the action stage of change. As such, her goal is to continue weight management plan. She has agreed to keeping a food journal and adhering to recommended goals of 1300-1400 calories and 80+ protein.  Behavioral Intervention  We discussed the following Behavioral Modification Strategies today: increasing lean protein intake, decreasing simple carbohydrates , increasing vegetables, increase water intake, work on meal planning and easy cooking plans, and think about ways to increase physical activity.  Additional resources provided today: NA  Recommended Physical Activity Goals  Lanie has been advised to work up to 150 minutes of moderate intensity aerobic activity a week and strengthening exercises 2-3 times per week for cardiovascular health, weight loss maintenance and preservation of muscle mass.   She has agreed to increase physical activity in their day and reduce sedentary time (increase NEAT).  and continue physical activity as is.    Pharmacotherapy We discussed various medication options to help [name] with her weight loss efforts and we both agreed to increase Zepbound 7.80m.  Side effects discussed.  Due to fatigue, continue Topamax but take only once per day.  To skip am dose.  To continue  Inderal once daily.     ASSOCIATED CONDITIONS ADDRESSED TODAY  Other fatigue Reports having labs in Nov. To bring to next visit to review.  If still having fatigue, will obtain labs at next visit for anemia, TSH, Vit D and Vit B12.  Her husband was just diagnosed with OSAS.  He does wake her up multiple times due to snoring. Once his sleep apnea is treated, I'm hoping her fatigue will decreased once she is able to get a good night sleep.    Morbid obesity ([place] -    Increase Zepbound; Inject 7.5 mg into the skin once a week.  Dispense: 2 mL; Refill: 0  side effects discussed.   BMI 37.0-37.9, adult      Return in about 4 weeks (around 05/23/2022).Marland Kitchen She was informed of the importance of frequent follow up visits to maximize her success with intensive lifestyle modifications for her multiple health conditions.   ATTESTASTION STATEMENTS:  Reviewed by clinician on day of visit: allergies, medications, problem list, medical history, surgical history, family history, social history, and previous encounter notes.    Ailene Rud. Shemeca Lukasik FNP-C

## 2022-04-25 NOTE — Patient Instructions (Signed)

## 2022-04-26 ENCOUNTER — Encounter: Payer: Self-pay | Admitting: Nurse Practitioner

## 2022-04-26 MED ORDER — ZEPBOUND 7.5 MG/0.5ML ~~LOC~~ SOAJ: 7.5000 mg | SUBCUTANEOUS | 0 refills | Status: DC

## 2022-04-27 ENCOUNTER — Telehealth (INDEPENDENT_AMBULATORY_CARE_PROVIDER_SITE_OTHER): Payer: Self-pay | Admitting: Nurse Practitioner

## 2022-04-27 MED ORDER — ZEPBOUND 7.5 MG/0.5ML ~~LOC~~ SOAJ: 7.5000 mg | SUBCUTANEOUS | 0 refills | Status: DC

## 2022-04-27 NOTE — Telephone Encounter (Signed)
Patient wants to know if the rx for Zepbound could be transferred to [facility] - [address], [place] [phone]). [facility] not an approved pharmacy of pt's insurance to supply Zepbound. Patient is out of the medication, with the next one due on Friday. Please call pt today at the number on file.   [facility].

## 2022-04-27 NOTE — Telephone Encounter (Signed)
Medication sent in to requested pharmacy. Patient advised.

## 2022-05-16 DIAGNOSIS — Z6839 Body mass index (BMI) 39.0-39.9, adult: Secondary | ICD-10-CM | POA: Diagnosis not present

## 2022-05-16 DIAGNOSIS — G8929 Other chronic pain: Secondary | ICD-10-CM | POA: Diagnosis not present

## 2022-05-16 DIAGNOSIS — M542 Cervicalgia: Secondary | ICD-10-CM | POA: Diagnosis not present

## 2022-05-16 DIAGNOSIS — R109 Unspecified abdominal pain: Secondary | ICD-10-CM | POA: Diagnosis not present

## 2022-05-20 ENCOUNTER — Other Ambulatory Visit: Payer: Self-pay | Admitting: Nurse Practitioner

## 2022-05-20 DIAGNOSIS — F3289 Other specified depressive episodes: Secondary | ICD-10-CM

## 2022-05-23 ENCOUNTER — Other Ambulatory Visit: Payer: Self-pay | Admitting: Family Medicine

## 2022-05-23 ENCOUNTER — Ambulatory Visit: Payer: BC Managed Care – PPO | Admitting: Nurse Practitioner

## 2022-05-23 DIAGNOSIS — R109 Unspecified abdominal pain: Secondary | ICD-10-CM

## 2022-06-17 ENCOUNTER — Other Ambulatory Visit: Payer: Self-pay | Admitting: Family Medicine

## 2022-06-17 ENCOUNTER — Ambulatory Visit
Admission: RE | Admit: 2022-06-17 | Discharge: 2022-06-17 | Disposition: A | Discharge status: Home / Self Care | Payer: BC Managed Care – PPO | Source: Ambulatory Visit | Attending: Family Medicine | Admitting: Family Medicine

## 2022-06-17 DIAGNOSIS — M542 Cervicalgia: Secondary | ICD-10-CM

## 2022-06-17 DIAGNOSIS — M4312 Spondylolisthesis, cervical region: Secondary | ICD-10-CM | POA: Diagnosis not present

## 2022-06-20 ENCOUNTER — Ambulatory Visit
Admission: RE | Admit: 2022-06-20 | Discharge: 2022-06-20 | Disposition: A | Discharge status: Home / Self Care | Payer: BC Managed Care – PPO | Source: Ambulatory Visit | Attending: Family Medicine | Admitting: Family Medicine

## 2022-06-20 DIAGNOSIS — R109 Unspecified abdominal pain: Secondary | ICD-10-CM

## 2022-06-20 DIAGNOSIS — Z853 Personal history of malignant neoplasm of breast: Secondary | ICD-10-CM | POA: Diagnosis not present

## 2022-06-20 DIAGNOSIS — D734 Cyst of spleen: Secondary | ICD-10-CM | POA: Diagnosis not present

## 2022-06-20 DIAGNOSIS — K5730 Diverticulosis of large intestine without perforation or abscess without bleeding: Secondary | ICD-10-CM | POA: Diagnosis not present

## 2022-06-20 DIAGNOSIS — K820 Obstruction of gallbladder: Secondary | ICD-10-CM | POA: Diagnosis not present

## 2022-06-26 NOTE — Progress Notes (Unsigned)
Cardiology Office Note:    Date:  06/27/2022   ID:  [name] [name], DOB [birth year], MRN [id number]  PCP:  Sigmund Hazel, MD  Cardiologist:  Little Ishikawa, MD  Electrophysiologist:  None   Referring MD: Sigmund Hazel, MD   Chief Complaint  Patient presents with   Palpitations    History of Present Illness:    [name] [name] is a 57 y.o. female with a hx of breast cancer, hypertension, obesity status post lap band who presents for follow-up.  She was referred by Dr. Hyacinth Meeker for evaluation of chest pain, initially seen on 02/10/2020.  She reports she underwent radiation treatment for breast cancer in 2013.  She had lap band in 2010, lost about 110 pounds, but has gained about 70 lbs back.  She is on hydrochlorothiazide for hypertension, reports BP usually 120s over 80s.  Reports chest pain started a month ago.  Describes tightness in the center of her chest that radiates to her left shoulder/back.  Occurs about once per month.  Episodes can last from 2 to 8 minutes.  She has not noticed what brings it on.  She walks 1 day/week for 15 to 30 minutes.  Does report that when she walks fast she will have chest pain.  She denies any dyspnea.  Reports occasional lightheadedness with standing.  Denies any syncope.  No lower extremity edema.  Reports rare palpitations, occurs about twice per year where she feels like her heart is racing, and can last for 15 minutes.  Reports she does have pain in her legs when she walks.  No smoking history.  Family history includes mother died of MI in 51s, had rheumatic fever and underwent valve replacement in 47s (she is unclear of details).  Father died of CVA in 71s.  Echocardiogram 03/09/2020 showed normal biventricular function, mild LVH, no significant valvular disease.  Normal ABIs on 03/11/2020.  Coronary CTA on 03/20/2020 showed calcium score 0, nonobstructive CAD with minimal stenosis in proximal RCA.  Zio patch x 13 days in 04/2020 showed  11 episodes of SVT with longest lasting 17 beats, no significant arrhythmias.  Since last clinic visit, she reports has been doing okay.  Had episode in December where felt like heart was racing.  Typically states that she has happened about once per year and typically only last for few minutes.  However this episode lasted for 45 minutes.  She was considering going to the ED and symptoms resolved.  She denies any chest pain or dyspnea.  Does report occasional lightheadedness.  Wt Readings from Last 3 Encounters:  06/27/22 249 lb (112.9 kg)  04/25/22 241 lb (109.3 kg)  03/28/22 239 lb (108.4 kg)     Past Medical History:  Diagnosis Date   Anemia    not at present   Anxiety    Arthritis    Osteoarthritis Neck and Back   Back pain    Breast cancer    Cancer 2013   Left breast cancer   Chest pain    Depression    DJD (degenerative joint disease)    Cervical and lumbar spin   Family history of uterine cancer    sister   GERD (gastroesophageal reflux disease)    Hot flashes    Hx of seizure disorder    Patient denies this dx - pt states "never had a seizure"   Hypertension    Hx prior to lap band surgery 10 yrs ago, lost weight, no BP issues since  surgery, no medication   Joint pain    Lower extremity edema    Obesity    Personal history of radiation therapy    2017 left breast   Prediabetes    S/P radiation therapy 06/13/2011 - 07/22/11   Left Breast/ 50 Gy in 25 Fractions: Left Breast Boost Boost/ 10 Gy in 5 Fractions   Seasonal allergies    Status post biopsy 03/21/11   Breast, Left, Needle Core Biopsy, [place] - DCIS, Grade I-III with comedo Necrosis and Calcifications. ER+, PR+, Ki-67 10%, Her2- No Amplification    Past Surgical History:  Procedure Laterality Date   ABDOMINAL HYSTERECTOMY     BIOPSY BREAST  08/07/09   Breast, Right Needle core Biopsy, UOQ - Pseudoangiomatous Stromal hyperplasia   BREAST BIOPSY  03/21/11   BREAST LUMPECTOMY  04/19/11   snbx   BREAST  LUMPECTOMY  05/10/11   Left Breast Lumpectomy re-excision   BREAST REDUCTION SURGERY  02/15/2012   Procedure: MAMMARY REDUCTION  (BREAST);  Surgeon: Wayland Denis, DO;  Location: [facility];  Service: Plastics;  Laterality: Right;  RIGHT BREAST MASTOPEXY   CESAREAN SECTION  1999, 2005   x 2   COLONOSCOPY     CYSTOSCOPY N/A 03/20/2018   Procedure: CYSTOSCOPY;  Surgeon: Huel Cote, MD;  Location: [facility];  Service: Gynecology;  Laterality: N/A;   DILATION AND CURETTAGE OF UTERUS     X 1   LAPAROSCOPIC GASTRIC BANDING  01/27/2009   REDUCTION MAMMAPLASTY Right    TUBAL LIGATION      Current Medications: Current Meds  Medication Sig   Bacillus Coagulans-Inulin (PROBIOTIC-PREBIOTIC PO) Take by mouth.   citalopram (CELEXA) 40 MG tablet Take 1 tablet (40 mg total) by mouth daily.   diclofenac (VOLTAREN) 0.1 % ophthalmic solution    ferrous sulfate 325 (65 FE) MG EC tablet Take 325 mg by mouth daily with breakfast.   hydrochlorothiazide (HYDRODIURIL) 25 MG tablet Take 1 tablet (25 mg total) by mouth daily.   lansoprazole (PREVACID) 30 MG capsule Take 30 mg by mouth 2 (two) times a week.   meloxicam (MOBIC) 15 MG tablet Take 15 mg by mouth daily.   Misc Natural Products (GLUCOSAMINE CHOND CMP ADVANCED) TABS Take by mouth.   Multiple Vitamins-Minerals (MULTIVITAMIN ADULT) CHEW 1 tablet   Omega-3 Fatty Acids (FISH OIL) 500 MG CAPS Take by mouth.   propranolol (INDERAL) 20 MG tablet Take 1 tablet (20 mg total) by mouth 3 (three) times daily.   rosuvastatin (CRESTOR) 10 MG tablet Take 1 tablet (10 mg total) by mouth daily.   topiramate (TOPAMAX) 50 MG tablet Take 1 tablet (50 mg total) by mouth 2 (two) times daily.   [DISCONTINUED] losartan (COZAAR) 50 MG tablet TAKE 1 TABLET DAILY     Allergies:   Prednisolone, Naltrexone-bupropion hcl er, and Phentermine hcl   Social History   Socioeconomic History   Marital status: Married    Spouse name: Not on file   Number of  children: Not on file   Years of education: Not on file   Highest education level: Not on file  Occupational History   Occupation: customer service rep  Tobacco Use   Smoking status: Never   Smokeless tobacco: Never  Vaping Use   Vaping Use: Never used  Substance and Sexual Activity   Alcohol use: Yes    Alcohol/week: 2.0 - 3.0 standard drinks of alcohol    Types: 2 - 3 Glasses of wine per week  Drug use: No   Sexual activity: Not on file    Comment: Menarche age [age], G69, P2, 1 miscarriage, Parity 28 or 30, no BC  Other Topics Concern   Not on file  Social History Narrative   Menarche age [age], G9, P86, 1 miscarriage, Parity 67 or 29, no BC   Social Determinants of Corporate investment banker Strain: Not on file  Food Insecurity: Not on file  Transportation Needs: Not on file  Physical Activity: Not on file  Stress: Not on file  Social Connections: Not on file     Family History: The patient's family history includes Anxiety disorder in her mother; Cancer in her father; Heart disease in her mother; Obesity in her mother; Skin cancer in her father and sister; Stroke in her father and mother; Sudden death in her mother; Uterine cancer (age of onset: 83) in her sister.  ROS:   Please see the history of present illness.     All other systems reviewed and are negative.  EKGs/Labs/Other Studies Reviewed:    The following studies were reviewed today:   EKG:   06/27/2022: Sinus bradycardia, rate 58, nonspecific T wave flattening   Recent Labs: 09/20/2021: ALT 16; BUN 13; Creatinine, Ser 0.89; Hemoglobin 13.9; Platelets 284; Potassium 3.9; Sodium 140; TSH 1.700  Recent Lipid Panel    Component Value Date/Time   CHOL 232 (H) 09/20/2021 0903   TRIG 76 09/20/2021 0903   HDL 59 09/20/2021 0903   LDLCALC 160 (H) 09/20/2021 0903    Physical Exam:    VS:  BP 120/86   Pulse (!) 58   Ht  (1.702 m)   Wt 249 lb (112.9 kg)   LMP 03/11/2018 (Exact Date)   SpO2 99%    BMI 39.00 kg/m     Wt Readings from Last 3 Encounters:  06/27/22 249 lb (112.9 kg)  04/25/22 241 lb (109.3 kg)  03/28/22 239 lb (108.4 kg)     GEN:  Well nourished, well developed in no acute distress  NECK: No JVD; No carotid bruits CARDIAC: RRR, no murmurs, rubs, gallops RESPIRATORY:  Clear to auscultation without rales, wheezing or rhonchi  ABDOMEN: Soft, non-tender, non-distended MUSCULOSKELETAL:  No edema; No deformity  SKIN: Warm and dry NEUROLOGIC:  Alert and oriented x 3 PSYCHIATRIC:  Normal affect   ASSESSMENT:    1. Coronary artery disease involving native coronary artery of native heart without angina pectoris   2. Palpitations   3. Essential hypertension   4. Hyperlipidemia, unspecified hyperlipidemia type   5. OSA (obstructive sleep apnea)     PLAN:    CAD: Reported atypical chest pain.  Echocardiogram 03/09/2020 showed normal biventricular function, mild LVH, no significant valvular disease.  Coronary CTA on 03/20/2020 showed calcium score 0, nonobstructive CAD with minimal stenosis in proximal RCA.   -LDL 158 on 02/16/2022, recommend starting rosuvastatin 10 mg daily  Palpitations: Zio patch x 13 days in 04/2020 showed 11 episodes of SVT with longest lasting 17 beats, no significant arrhythmias -Reports had episode of palpitations lasting 45 minutes in December 2023, none since.  Recommend [employer] for long term monitoring  Hypertension: On HCTZ 25 mg daily, losartan 50 mg daily.  Also takes propranolol 20 mg once per day for anxiety.  Appears controlled  Hyperlipidemia: LDL 158 on 02/16/2022.  Given nonobstructive CAD on coronary CTA as above, will start rosuvastatin 10 mg daily.  Check fasting lipid panel in 2 months  Leg pain: Normal ABIs  on 03/11/2020.  OSA: Positive sleep study on 03/23/2020.  She did not start CPAP.  She reports having daytime somnolence.  Recommend checking [facility] sleep study and will plan to start CPAP if positive.  STOP-BANG  5  Obesity: Body mass index is 39 kg/m.  Previously followed with [facility].  Diet/exercise recommended  RTC in 6 months   Medication Adjustments/Labs and Tests Ordered: Current medicines are reviewed at length with the patient today.  Concerns regarding medicines are outlined above.  Orders Placed This Encounter  Procedures   Comprehensive metabolic panel   Lipid panel   EKG 12-Lead   [facility] Sleep Study   Meds ordered this encounter  Medications   losartan (COZAAR) 50 MG tablet    Sig: Take 1 tablet (50 mg total) by mouth daily.    Dispense:  90 tablet    Refill:  3   rosuvastatin (CRESTOR) 10 MG tablet    Sig: Take 1 tablet (10 mg total) by mouth daily.    Dispense:  90 tablet    Refill:  3    Patient Instructions  Medication Instructions:  START rosuvastatin (Crestor) 10 mg daily  *If you need a refill on your cardiac medications before your next appointment, please call your pharmacy*   Lab Work: Please return for FASTING labs in 2 months (CMET, Mag, Lipid)  Our in office lab hours are Monday-Friday 8:00-4:00, closed for lunch 1-2 pm.  No appointment needed.  [facility] locations:   [facility] - [address]  - [address] ([facility]) - [address] [phone] [address]. [address]   [place] - [address]    [place]  - [address]    [facility] - [address] - [address] [employer]  - [address] - [address] ([facility])  Testing/Procedures: WatchPAT?  Is a FDA cleared portable home sleep study test that uses a watch and 3 points of contact to monitor 7 different channels, including your heart rate, oxygen saturations, body position, snoring, and chest motion.  The study is easy to use from the comfort of your own home and accurately detect sleep apnea.  Before bed, you attach the chest  sensor, attached the sleep apnea bracelet to your nondominant hand, and attach the finger probe.  After the study, the raw data is downloaded from the watch and scored for apnea events.   For more information: [email or web address]  Patient Testing Instructions:  Do not put battery into the device until bedtime when you are ready to begin the test. Please call the support number if you need assistance after following the instructions below: 24 hour support line- [phone] or [facility] support at [phone] (option 2)  Download the IntelWatchPAT One" app through the google play store or App Store  Be sure to turn on or enable access to bluetooth in settlings on your smartphone/ device  Make sure no other bluetooth devices are on and within the vicinity of your smartphone/ device and WatchPAT watch during testing.  Make sure to leave your smart phone/ device plugged in and charging all night.  When ready for bed:  Follow the instructions step by step in the WatchPAT One App to activate the testing device. For additional instructions, including video instruction, visit the WatchPAT One video on Youtube. You can search for [employer]  One within Youtube (video is 4 minutes and 18 seconds) or enter: https://youtube/watch?v=BCce_vbiwxE Please note: You will be prompted to enter a Pin to connect via bluetooth when starting the test. The PIN will be assigned to you when you receive the test.  The device is disposable, but it recommended that you retain the device until you receive a call letting you know the study has been received and the results have been interpreted.  We will let you know if the study did not transmit to [place] properly after the test is completed. You do not need to call us to confirm the receipt of the test.  Please complete the test within 48 hours of receiving PIN.   Frequently Asked Questions:  What is Watch Dennie Bible one?  A single use fully disposable home sleep apnea  testing device and will not need to be returned after completion.  What are the requirements to use WatchPAT one?  The be able to have a successful watchpat one sleep study, you should have your Watch pat one device, your smart phone, watch pat one app, your PIN number and Internet access What type of phone do I need?  You should have a smart phone that uses Android 5.1 and above or any Iphone with IOS 10 and above How can I download the WatchPAT one app?  Based on your device type search for WatchPAT one app either in google play for android devices or APP store for Iphone's Where will I get my PIN for the study?  Your PIN will be provided by your physician's office. It is used for authentication and if you lose/forget your PIN, please reach out to your providers office.  I do not have Internet at home. Can I do WatchPAT one study?  WatchPAT One needs Internet connection throughout the night to be able to transmit the sleep data. You can use your home/local internet or your cellular's data package. However, it is always recommended to use home/local Internet. It is estimated that between 20MB-30MB will be used with each study.However, the application will be looking for space in the phone to start the study.  What happens if I lose internet or bluetooth connection?  During the internet disconnection, your phone will not be able to transmit the sleep data. All the data, will be stored in your phone. As soon as the internet connection is back on, the phone will being sending the sleep data. During the bluetooth disconnection, WatchPAT one will not be able to to send the sleep data to your phone. Data will be kept in the [facility] one until two devices have bluetooth connection back on. As soon as the connection is back on, WatchPAT one will send the sleep data to the phone.  How long do I need to wear the WatchPAT one?  After you start the study, you should wear the device at least 6 hours.  How  far should I keep my phone from the device?  During the night, your phone should be within 15 feet.  What happens if I leave the room for restroom or other reasons?  Leaving the room for any reason will not cause any problem. As soon as your get back to the room, both devices will reconnect and will continue to send the sleep data. Can I use my phone during the sleep study?  Yes, you can use your phone as usual during the study. But it is recommended to put your watchpat one on when  you are ready to go to bed.  How will I get my study results?  A soon as you completed your study, your sleep data will be sent to the provider. They will then share the results with you when they are ready.   Follow-Up: At [facility], you and your health needs are our priority.  As part of our continuing mission to provide you with exceptional heart care, we have created designated Provider Care Teams.  These Care Teams include your primary Cardiologist (physician) and Advanced Practice Providers (APPs -  Physician Assistants and Nurse Practitioners) who all work together to provide you with the care you need, when you need it.  We recommend signing up for the patient portal called "MyChart".  Sign up information is provided on this After Visit Summary.  MyChart is used to connect with patients for Virtual Visits (Telemedicine).  Patients are able to view lab/test results, encounter notes, upcoming appointments, etc.  Non-urgent messages can be sent to your provider as well.   To learn more about what you can do with MyChart, go to [email or web address].    Your next appointment:   6 month(s)  Provider:   Little Ishikawa, MD     Other Instructions You can look into the [facility] device by [employer]. This device is purchased by you and it connects to an application you download to your smart phone.  It can detect abnormal heart rhythms and alert you to contact your doctor for further  evaluation. The web site is:  [email or web address]      Signed, Little Ishikawa, MD  06/27/2022 9:07 AM    [facility]

## 2022-06-27 ENCOUNTER — Ambulatory Visit: Payer: BC Managed Care – PPO | Attending: Cardiology | Admitting: Cardiology

## 2022-06-27 ENCOUNTER — Encounter: Payer: Self-pay | Admitting: Cardiology

## 2022-06-27 VITALS — BP 120/86 | HR 58 | Ht 67.0 in | Wt 249.0 lb

## 2022-06-27 DIAGNOSIS — R002 Palpitations: Secondary | ICD-10-CM | POA: Diagnosis not present

## 2022-06-27 DIAGNOSIS — G4733 Obstructive sleep apnea (adult) (pediatric): Secondary | ICD-10-CM

## 2022-06-27 DIAGNOSIS — E785 Hyperlipidemia, unspecified: Secondary | ICD-10-CM | POA: Diagnosis not present

## 2022-06-27 DIAGNOSIS — I2510 Atherosclerotic heart disease of native coronary artery without angina pectoris: Secondary | ICD-10-CM

## 2022-06-27 DIAGNOSIS — I10 Essential (primary) hypertension: Secondary | ICD-10-CM | POA: Diagnosis not present

## 2022-06-27 MED ORDER — ROSUVASTATIN CALCIUM 10 MG PO TABS: 10.0000 mg | ORAL_TABLET | Freq: Every day | ORAL | 3 refills | Status: DC

## 2022-06-27 MED ORDER — LOSARTAN POTASSIUM 50 MG PO TABS: 50.0000 mg | ORAL_TABLET | Freq: Every day | ORAL | 3 refills | Status: DC

## 2022-06-27 NOTE — Patient Instructions (Signed)
Medication Instructions:  START rosuvastatin (Crestor) 10 mg daily  *If you need a refill on your cardiac medications before your next appointment, please call your pharmacy*   Lab Work: Please return for FASTING labs in 2 months (CMET, Mag, Lipid)  Our in office lab hours are Monday-Friday 8:00-4:00, closed for lunch 1-2 pm.  No appointment needed.  [facility] locations:   [facility] - [address]  - [address] ([facility]) - [address] [phone] [address]   [place] - [address]    [place]  - [address]    [place] - [address] - [address] [employer] [facility]  - [address] ([facility])  Testing/Procedures: WatchPAT?  Is a FDA cleared portable home sleep study test that uses a watch and 3 points of contact to monitor 7 different channels, including your heart rate, oxygen saturations, body position, snoring, and chest motion.  The study is easy to use from the comfort of your own home and accurately detect sleep apnea.  Before bed, you attach the chest sensor, attached the sleep apnea bracelet to your nondominant hand, and attach the finger probe.  After the study, the raw data is downloaded from the watch and scored for apnea events.   For more information: [email or web address]  Patient Testing Instructions:  Do not put battery into the device until bedtime when you are ready to begin the test. Please call the support number if you need assistance after following the instructions below: 24 hour support line- [phone] or [facility] support at [phone] (option 2)  Download the IntelWatchPAT One" app through the google play store or App Store  Be sure to turn on or enable access to bluetooth in settlings on your smartphone/ device  Make sure no other bluetooth devices are on and  within the vicinity of your smartphone/ device and WatchPAT watch during testing.  Make sure to leave your smart phone/ device plugged in and charging all night.  When ready for bed:  Follow the instructions step by step in the WatchPAT One App to activate the testing device. For additional instructions, including video instruction, visit the WatchPAT One video on Youtube. You can search for WatchPat One within Youtube (video is 4 minutes and 18 seconds) or enter: https://youtube/watch?[email or web address]_vbiwxE Please note: You will be prompted to enter a Pin to connect via bluetooth when starting the test. The PIN will be assigned to you when you receive the test.  The device is disposable, but it recommended that you retain the device until you receive a call letting you know the study has been received and the results have been interpreted.  We will let you know if the study did not transmit to [place] properly after the test is completed. You do not need to call us to confirm the receipt of the test.  Please complete the test within 48 hours of receiving PIN.   Frequently Asked Questions:  What is Watch Dennie Bible one?  A single use fully disposable home sleep apnea testing device and will not need to be returned after completion.  What are the requirements to use WatchPAT one?  The be able to have a successful watchpat one sleep study, you should have your Watch pat one device, your smart phone, watch pat one app, your PIN number and Internet  access What type of phone do I need?  You should have a smart phone that uses Android 5.1 and above or any Iphone with IOS 10 and above How can I download the WatchPAT one app?  Based on your device type search for WatchPAT one app either in google play for android devices or APP store for Iphone's Where will I get my PIN for the study?  Your PIN will be provided by your physician's office. It is used for authentication and if you lose/forget your PIN, please reach out to your  providers office.  I do not have Internet at home. Can I do WatchPAT one study?  WatchPAT One needs Internet connection throughout the night to be able to transmit the sleep data. You can use your home/local internet or your cellular's data package. However, it is always recommended to use home/local Internet. It is estimated that between 20MB-30MB will be used with each study.However, the application will be looking for space in the phone to start the study.  What happens if I lose internet or bluetooth connection?  During the internet disconnection, your phone will not be able to transmit the sleep data. All the data, will be stored in your phone. As soon as the internet connection is back on, the phone will being sending the sleep data. During the bluetooth disconnection, WatchPAT one will not be able to to send the sleep data to your phone. Data will be kept in the [facility] one until two devices have bluetooth connection back on. As soon as the connection is back on, WatchPAT one will send the sleep data to the phone.  How long do I need to wear the WatchPAT one?  After you start the study, you should wear the device at least 6 hours.  How far should I keep my phone from the device?  During the night, your phone should be within 15 feet.  What happens if I leave the room for restroom or other reasons?  Leaving the room for any reason will not cause any problem. As soon as your get back to the room, both devices will reconnect and will continue to send the sleep data. Can I use my phone during the sleep study?  Yes, you can use your phone as usual during the study. But it is recommended to put your watchpat one on when you are ready to go to bed.  How will I get my study results?  A soon as you completed your study, your sleep data will be sent to the provider. They will then share the results with you when they are ready.   Follow-Up: At [facility], you and your health needs are  our priority.  As part of our continuing mission to provide you with exceptional heart care, we have created designated Provider Care Teams.  These Care Teams include your primary Cardiologist (physician) and Advanced Practice Providers (APPs -  Physician Assistants and Nurse Practitioners) who all work together to provide you with the care you need, when you need it.  We recommend signing up for the patient portal called "MyChart".  Sign up information is provided on this After Visit Summary.  MyChart is used to connect with patients for Virtual Visits (Telemedicine).  Patients are able to view lab/test results, encounter notes, upcoming appointments, etc.  Non-urgent messages can be sent to your provider as well.   To learn more about what you can do with MyChart, go to [email or web address].  Your next appointment:   6 month(s)  Provider:   Little Ishikawa, MD     Other Instructions You can look into the [facility] device by [employer]. This device is purchased by you and it connects to an application you download to your smart phone.  It can detect abnormal heart rhythms and alert you to contact your doctor for further evaluation. The web site is:  [email or web address]

## 2022-06-28 ENCOUNTER — Telehealth: Payer: Self-pay | Admitting: *Deleted

## 2022-06-28 NOTE — Telephone Encounter (Signed)
Prior Authorization for Automatic Data device sent to [facility] via Phone. No PA is required. Call Reference # [id number] Budge notified ok to activate.

## 2022-07-04 DIAGNOSIS — M1712 Unilateral primary osteoarthritis, left knee: Secondary | ICD-10-CM | POA: Diagnosis not present

## 2022-07-06 ENCOUNTER — Encounter: Payer: Self-pay | Admitting: Cardiology

## 2022-07-12 ENCOUNTER — Telehealth: Payer: Self-pay

## 2022-07-12 ENCOUNTER — Encounter: Payer: Self-pay | Admitting: Cardiology

## 2022-07-12 NOTE — Telephone Encounter (Signed)
Called and made the patient aware that SHE may proceed with the [facility] Sleep Study. PIN # provided to the patient. Patient made aware that SHE will be contacted after the test has been read with the results and any recommendations. Patient verbalized understanding and thanked me for the call.   

## 2022-07-12 NOTE — Telephone Encounter (Signed)
Pt calling for back frustrated because she hasn't heard back about how to set this sleep study up. Is someone able to assist her please

## 2022-07-13 ENCOUNTER — Encounter (INDEPENDENT_AMBULATORY_CARE_PROVIDER_SITE_OTHER): Payer: BC Managed Care – PPO | Admitting: Cardiology

## 2022-07-13 DIAGNOSIS — G4733 Obstructive sleep apnea (adult) (pediatric): Secondary | ICD-10-CM

## 2022-07-15 ENCOUNTER — Ambulatory Visit: Payer: BC Managed Care – PPO | Attending: Cardiology

## 2022-07-15 DIAGNOSIS — G4733 Obstructive sleep apnea (adult) (pediatric): Secondary | ICD-10-CM

## 2022-07-15 NOTE — Procedures (Signed)
SLEEP STUDY REPORT Patient Information Study Date: 07/13/2022 Patient Name: [name] [name] Patient ID: [id number] Birth Date: 1965/06/03 Age: 57 Gender: Female BMI: 39.1 (W=249 lb, H=5' 7'') Referring Physician: Epifanio Lesches, MD  TEST DESCRIPTION:  Home sleep apnea testing was completed using the WatchPat, a Type 1 device, utilizing peripheral arterial tonometry (PAT), chest movement, actigraphy, pulse oximetry, pulse rate, body position and snore.  AHI was calculated with apnea and hypopnea using valid sleep time as the denominator. RDI includes apneas, hypopneas, and RERAs.  The data acquired and the scoring of sleep and all associated events were performed in accordance with the recommended standards and specifications as outlined in the AASM Manual for the Scoring of Sleep and Associated Events 2.2.0 (2015).  FINDINGS:  1.  Moderate Obstructive Sleep Apnea with AHI 15.8/hr.   2.  No Central Sleep Apnea with pAHIc 1.2/hr.  3.  Oxygen desaturations as low as 80%.  4.  Mild snoring was present. O2 sats were < 88% for 33.6 min.  5.  Total sleep time was 8 hrs and 8 min.  6.  19% of total sleep time was spent in REM sleep.   7.  Shortened sleep onset latency at 6 min  8.  Prolonged REM sleep onset latency at 188 min.   9.  Total awakenings were 11.  10. Arrhythmia detection: None.  DIAGNOSIS:   Moderate Obstructive Sleep Apnea (G47.33) Nocturnal Hypoxemia  RECOMMENDATIONS:   1.  Clinical correlation of these findings is necessary.  The decision to treat obstructive sleep apnea (OSA) is usually based on the presence of apnea symptoms or the presence of associated medical conditions such as Hypertension, Congestive Heart Failure, Atrial Fibrillation or Obesity.  The most common symptoms of OSA are snoring, gasping for breath while sleeping, daytime sleepiness and fatigue.   2.  Initiating apnea therapy is recommended given the presence of symptoms and/or associated  conditions. Recommend proceeding with one of the following:     a.  Auto-CPAP therapy with a pressure range of 5-20cm H2O.     b.  An oral appliance (OA) that can be obtained from certain dentists with expertise in sleep medicine.  These are primarily of use in non-obese patients with mild and moderate disease.     c.  An ENT consultation which may be useful to look for specific causes of obstruction and possible treatment options.     d.  If patient is intolerant to PAP therapy, consider referral to ENT for evaluation for hypoglossal nerve stimulator.   3.  Close follow-up is necessary to ensure success with CPAP or oral appliance therapy for maximum benefit.  4.  A follow-up oximetry study on CPAP is recommended to assess the adequacy of therapy and determine the need for supplemental oxygen or the potential need for Bi-level therapy.  An arterial blood gas to determine the adequacy of baseline ventilation and oxygenation should also be considered.  5.  Healthy sleep recommendations include:  adequate nightly sleep (normal 7-9 hrs/night), avoidance of caffeine after noon and alcohol near bedtime, and maintaining a sleep environment that is cool, dark and quiet.  6.  Weight loss for overweight patients is recommended.  Even modest amounts of weight loss can significantly improve the severity of sleep apnea.  7.  Snoring recommendations include:  weight loss where appropriate, side sleeping, and avoidance of alcohol before bed.  8.  Operation of motor vehicle should not be performed when sleepy.  Signature:  Armanda Magic, MD; [facility]; Diplomat, American Board of Sleep Medicine Electronically Signed: 07/15/2022 6:16:20 PM

## 2022-07-25 ENCOUNTER — Telehealth: Payer: Self-pay

## 2022-07-25 DIAGNOSIS — G4733 Obstructive sleep apnea (adult) (pediatric): Secondary | ICD-10-CM

## 2022-07-25 NOTE — Telephone Encounter (Signed)
-----   Message from Quintella Reichert, MD sent at 07/15/2022  6:18 PM EDT ----- Please let patient know that they have sleep apnea.  Recommend therapeutic CPAP titration for treatment of patient's sleep disordered breathing.  If unable to perform an in lab titration then initiate ResMed auto CPAP from 4 to 15cm H2O with heated humidity and mask of choice and overnight pulse ox on CPAP.

## 2022-07-25 NOTE — Telephone Encounter (Signed)
Spoke with patient about Sleep Study results. Patient verbalized understanding. All questions (if any) were answered.

## 2022-08-10 ENCOUNTER — Encounter: Payer: Self-pay | Admitting: Cardiology

## 2022-08-16 ENCOUNTER — Other Ambulatory Visit: Payer: Self-pay

## 2022-08-16 DIAGNOSIS — G4733 Obstructive sleep apnea (adult) (pediatric): Secondary | ICD-10-CM

## 2022-08-26 DIAGNOSIS — R002 Palpitations: Secondary | ICD-10-CM | POA: Diagnosis not present

## 2022-08-26 DIAGNOSIS — I10 Essential (primary) hypertension: Secondary | ICD-10-CM | POA: Diagnosis not present

## 2022-08-26 DIAGNOSIS — E785 Hyperlipidemia, unspecified: Secondary | ICD-10-CM | POA: Diagnosis not present

## 2022-08-26 DIAGNOSIS — I2510 Atherosclerotic heart disease of native coronary artery without angina pectoris: Secondary | ICD-10-CM | POA: Diagnosis not present

## 2022-08-26 NOTE — Addendum Note (Signed)
Addended by: Brunetta Genera on: 08/26/2022 12:59 PM   Modules accepted: Orders

## 2022-08-27 LAB — LIPID PANEL
Chol/HDL Ratio: 3 ratio (ref 0.0–4.4)
Cholesterol, Total: 166 mg/dL (ref 100–199)
HDL: 55 mg/dL (ref >39)
LDL Chol Calc (NIH): 89 mg/dL (ref 0–99)
Triglycerides: 124 mg/dL (ref 0–149)
VLDL Cholesterol Cal: 22 mg/dL (ref 5–40)

## 2022-08-27 LAB — COMPREHENSIVE METABOLIC PANEL
ALT: 20 IU/L (ref 0–32)
AST: 19 IU/L (ref 0–40)
Albumin/Globulin Ratio: 1.7
Albumin: 4.5 g/dL (ref 3.8–4.9)
Alkaline Phosphatase: 26 IU/L — ABNORMAL LOW (ref 44–121)
BUN/Creatinine Ratio: 16 (ref 9–23)
BUN: 15 mg/dL (ref 6–24)
Bilirubin Total: 0.4 mg/dL (ref 0.0–1.2)
CO2: 27 mmol/L (ref 20–29)
Calcium: 9.6 mg/dL (ref 8.7–10.2)
Chloride: 101 mmol/L (ref 96–106)
Creatinine, Ser: 0.91 mg/dL (ref 0.57–1.00)
Globulin, Total: 2.6 g/dL (ref 1.5–4.5)
Glucose: 82 mg/dL (ref 70–99)
Potassium: 4.2 mmol/L (ref 3.5–5.2)
Sodium: 141 mmol/L (ref 134–144)
Total Protein: 7.1 g/dL (ref 6.0–8.5)
eGFR: 74 mL/min/{1.73_m2} (ref >59)

## 2022-08-30 ENCOUNTER — Telehealth: Payer: Self-pay

## 2022-08-30 NOTE — Telephone Encounter (Signed)
Spoke to patient she stated she needs cpap titration.Stated it has been 6 weeks and she has not heard.Advised we have a new sleep coordinator I will send message to her.

## 2022-08-31 ENCOUNTER — Other Ambulatory Visit: Payer: Self-pay

## 2022-09-02 ENCOUNTER — Other Ambulatory Visit: Payer: Self-pay

## 2022-09-02 DIAGNOSIS — E785 Hyperlipidemia, unspecified: Secondary | ICD-10-CM

## 2022-09-02 MED ORDER — ATORVASTATIN CALCIUM 20 MG PO TABS: 20.0000 mg | ORAL_TABLET | Freq: Every day | ORAL | 6 refills | Status: DC

## 2022-09-05 DIAGNOSIS — Z1389 Encounter for screening for other disorder: Secondary | ICD-10-CM | POA: Diagnosis not present

## 2022-09-05 DIAGNOSIS — Z01419 Encounter for gynecological examination (general) (routine) without abnormal findings: Secondary | ICD-10-CM | POA: Diagnosis not present

## 2022-09-05 DIAGNOSIS — Z130 Encounter for screening for diseases of the blood and blood-forming organs and certain disorders involving the immune mechanism: Secondary | ICD-10-CM | POA: Diagnosis not present

## 2022-09-05 DIAGNOSIS — Z1231 Encounter for screening mammogram for malignant neoplasm of breast: Secondary | ICD-10-CM | POA: Diagnosis not present

## 2022-09-05 NOTE — Addendum Note (Signed)
Addended by: Brunetta Genera on: 09/05/2022 03:58 PM   Modules accepted: Orders

## 2022-09-05 NOTE — Addendum Note (Signed)
Addended by: Reesa Chew on: 09/05/2022 03:57 PM   Modules accepted: Orders

## 2022-09-14 ENCOUNTER — Telehealth: Payer: Self-pay

## 2022-09-14 NOTE — Telephone Encounter (Signed)
Late entry documentation 08/31/22, Spoke with patient, notified patient of Sleep Lab being delayed in scheduling. Will reach out to Sleep Lab to check status of CPAP Titration order.

## 2022-09-23 DIAGNOSIS — M5416 Radiculopathy, lumbar region: Secondary | ICD-10-CM | POA: Diagnosis not present

## 2022-09-29 NOTE — Telephone Encounter (Signed)
Patient calling to speak with the nurse for update regarding to sleep study. She stated that she never spoke with anyone. Please advise

## 2022-10-07 ENCOUNTER — Encounter: Payer: Self-pay | Admitting: Cardiology

## 2022-10-07 ENCOUNTER — Other Ambulatory Visit: Payer: Self-pay | Admitting: Cardiology

## 2022-10-07 ENCOUNTER — Other Ambulatory Visit: Payer: Self-pay | Admitting: Physical Medicine and Rehabilitation

## 2022-10-07 DIAGNOSIS — M5416 Radiculopathy, lumbar region: Secondary | ICD-10-CM

## 2022-10-10 NOTE — Telephone Encounter (Signed)
Recommend trying a nonstatin medicine for her cholesterol.  Recommend starting Zetia 10 mg daily  [name] [name], can you look into what the issue is with her getting her CPAP titration?  Thanks, Thayer Ohm

## 2022-10-17 ENCOUNTER — Other Ambulatory Visit: Payer: Self-pay

## 2022-10-17 MED ORDER — EZETIMIBE 10 MG PO TABS: 10.0000 mg | ORAL_TABLET | Freq: Every day | ORAL | 3 refills | Status: AC

## 2022-10-18 ENCOUNTER — Encounter: Payer: Self-pay | Admitting: Cardiology

## 2022-10-18 ENCOUNTER — Telehealth: Payer: Self-pay

## 2022-10-18 NOTE — Telephone Encounter (Signed)
Patient called with concerns of not having sleep study scheduled. I gave patient phone number to [employer].

## 2022-10-19 ENCOUNTER — Telehealth: Payer: Self-pay

## 2022-10-19 ENCOUNTER — Other Ambulatory Visit: Payer: Self-pay | Admitting: Physical Medicine and Rehabilitation

## 2022-10-19 NOTE — Telephone Encounter (Signed)
**Note De-Identified  Obfuscation** Per the pts [facility] message I attempted to do CPAP PA  the [employer] web portal but was unable so I called [employer] and s/w Selena Batten P who advised me that a PA is not required per the pts plan for a CPAP (CPT code; 16109). Reference #: Soledad Gerlach 10/19/2022 8:31 am  I have transferred the pts CPAP order to the sleep lab with a message "Ready to schedule-if CPAP fails move to BIPAP". I notified the pt of this approval  her [facility] message.

## 2022-10-23 ENCOUNTER — Other Ambulatory Visit: Payer: BC Managed Care – PPO

## 2022-10-28 ENCOUNTER — Encounter: Payer: Self-pay | Admitting: Cardiology

## 2022-11-07 ENCOUNTER — Encounter (HOSPITAL_BASED_OUTPATIENT_CLINIC_OR_DEPARTMENT_OTHER): Payer: Self-pay | Admitting: Cardiology

## 2022-11-25 ENCOUNTER — Encounter (HOSPITAL_BASED_OUTPATIENT_CLINIC_OR_DEPARTMENT_OTHER): Payer: BC Managed Care – PPO | Admitting: Cardiology

## 2022-12-02 DIAGNOSIS — Z6841 Body Mass Index (BMI) 40.0 and over, adult: Secondary | ICD-10-CM | POA: Diagnosis not present

## 2022-12-02 DIAGNOSIS — G4733 Obstructive sleep apnea (adult) (pediatric): Secondary | ICD-10-CM | POA: Diagnosis not present

## 2022-12-02 DIAGNOSIS — I10 Essential (primary) hypertension: Secondary | ICD-10-CM | POA: Diagnosis not present

## 2022-12-02 DIAGNOSIS — E7800 Pure hypercholesterolemia, unspecified: Secondary | ICD-10-CM | POA: Diagnosis not present

## 2022-12-05 ENCOUNTER — Telehealth: Payer: Self-pay | Admitting: *Deleted

## 2022-12-05 NOTE — Telephone Encounter (Signed)
Patient has an appointment with Dr Louis Meckel on Friday 12/02/22 at 11:15.

## 2022-12-05 NOTE — Telephone Encounter (Signed)
Hello Mrs. Wierzbicki, Your referral to [facility] for your titration has been faxed over to fax # [phone] (referral coordinator) and to Per patient request to [phone] (sleep lab.

## 2022-12-06 ENCOUNTER — Encounter: Payer: Self-pay | Admitting: Cardiology

## 2022-12-18 NOTE — Progress Notes (Unsigned)
Cardiology Office Note:    Date:  12/20/2022   ID:  [name] [name], DOB [birth year], MRN [id number]  PCP:  Sigmund Hazel, MD  Cardiologist:  Little Ishikawa, MD  Electrophysiologist:  None   Referring MD: Sigmund Hazel, MD   Chief Complaint  Patient presents with   Coronary Artery Disease    History of Present Illness:    [name] [name] is a 58 y.o. female with a hx of breast cancer, hypertension, obesity status post lap band who presents for follow-up.  She was referred by Dr. Hyacinth Meeker for evaluation of chest pain, initially seen on 02/10/2020.  She reports she underwent radiation treatment for breast cancer in 2013.  She had lap band in 2010, lost about 110 pounds, but has gained about 70 lbs back.  She is on hydrochlorothiazide for hypertension, reports BP usually 120s over 80s.  Reports chest pain started a month ago.  Describes tightness in the center of her chest that radiates to her left shoulder/back.  Occurs about once per month.  Episodes can last from 2 to 8 minutes.  She has not noticed what brings it on.  She walks 1 day/week for 15 to 30 minutes.  Does report that when she walks fast she will have chest pain.  She denies any dyspnea.  Reports occasional lightheadedness with standing.  Denies any syncope.  No lower extremity edema.  Reports rare palpitations, occurs about twice per year where she feels like her heart is racing, and can last for 15 minutes.  Reports she does have pain in her legs when she walks.  No smoking history.  Family history includes mother died of MI in 64s, had rheumatic fever and underwent valve replacement in 62s (she is unclear of details).  Father died of CVA in 79s.  Echocardiogram 03/09/2020 showed normal biventricular function, mild LVH, no significant valvular disease.  Normal ABIs on 03/11/2020.  Coronary CTA on 03/20/2020 showed calcium score 0, nonobstructive CAD with minimal stenosis in proximal RCA.  Zio patch x 13 days in  04/2020 showed 11 episodes of SVT with longest lasting 17 beats, no significant arrhythmias.  Since last clinic visit, she reports she is doing okay.  Continues to have palpitations, has but normal rhythm when checked on monitor.  Having episodes of chest pain when heart is racing.  Denies any shortness of breath.  Denies any lightheadedness, syncope, lower extremity edema, or palpitations.  Exercise has been limited by knee pain.   Wt Readings from Last 3 Encounters:  12/19/22 261 lb (118.4 kg)  06/27/22 249 lb (112.9 kg)  04/25/22 241 lb (109.3 kg)     Past Medical History:  Diagnosis Date   Anemia    not at present   Anxiety    Arthritis    Osteoarthritis Neck and Back   Back pain    Breast cancer (HCC)    Cancer (HCC) 2013   Left breast cancer   Chest pain    Depression    DJD (degenerative joint disease)    Cervical and lumbar spin   Family history of uterine cancer    sister   GERD (gastroesophageal reflux disease)    Hot flashes    Hx of seizure disorder    Patient denies this dx - pt states "never had a seizure"   Hypertension    Hx prior to lap band surgery 10 yrs ago, lost weight, no BP issues since surgery, no medication   Joint pain  Lower extremity edema    Obesity    Personal history of radiation therapy    2017 left breast   Prediabetes    S/P radiation therapy 06/13/2011 - 07/22/11   Left Breast/ 50 Gy in 25 Fractions: Left Breast Boost Boost/ 10 Gy in 5 Fractions   Seasonal allergies    Status post biopsy 03/21/11   Breast, Left, Needle Core Biopsy, [place] - DCIS, Grade I-III with comedo Necrosis and Calcifications. ER+, PR+, Ki-67 10%, Her2- No Amplification    Past Surgical History:  Procedure Laterality Date   ABDOMINAL HYSTERECTOMY     BIOPSY BREAST  08/07/09   Breast, Right Needle core Biopsy, UOQ - Pseudoangiomatous Stromal hyperplasia   BREAST BIOPSY  03/21/11   BREAST LUMPECTOMY  04/19/11   snbx   BREAST LUMPECTOMY  05/10/11   Left Breast  Lumpectomy re-excision   BREAST REDUCTION SURGERY  02/15/2012   Procedure: MAMMARY REDUCTION  (BREAST);  Surgeon: Wayland Denis, DO;  Location: [facility];  Service: Plastics;  Laterality: Right;  RIGHT BREAST MASTOPEXY   CESAREAN SECTION  1999, 2005   x 2   COLONOSCOPY     CYSTOSCOPY N/A 03/20/2018   Procedure: CYSTOSCOPY;  Surgeon: Huel Cote, MD;  Location: [facility];  Service: Gynecology;  Laterality: N/A;   DILATION AND CURETTAGE OF UTERUS     X 1   LAPAROSCOPIC GASTRIC BANDING  01/27/2009   REDUCTION MAMMAPLASTY Right    TUBAL LIGATION      Current Medications: Current Meds  Medication Sig   Bacillus Coagulans-Inulin (PROBIOTIC-PREBIOTIC PO) Take by mouth.   celecoxib (CELEBREX) 200 MG capsule Take 200 mg by mouth as needed.   cetirizine (ZYRTEC) 10 MG tablet Take 10 mg by mouth daily.   citalopram (CELEXA) 40 MG tablet Take 1 tablet (40 mg total) by mouth daily.   diclofenac (VOLTAREN) 0.1 % ophthalmic solution    ezetimibe (ZETIA) 10 MG tablet Take 1 tablet (10 mg total) by mouth daily.   ferrous sulfate 325 (65 FE) MG EC tablet Take 325 mg by mouth daily with breakfast.   hydrochlorothiazide (HYDRODIURIL) 25 MG tablet Take 1 tablet (25 mg total) by mouth daily.   lansoprazole (PREVACID) 30 MG capsule Take 30 mg by mouth 2 (two) times a week.   losartan (COZAAR) 50 MG tablet Take 1 tablet (50 mg total) by mouth daily.   Misc Natural Products (GLUCOSAMINE CHOND CMP ADVANCED) TABS Take by mouth.   Multiple Vitamins-Minerals (MULTIVITAMIN ADULT) CHEW 1 tablet   Omega-3 Fatty Acids (FISH OIL) 500 MG CAPS Take by mouth.   propranolol (INDERAL) 20 MG tablet Take 1 tablet (20 mg total) by mouth 3 (three) times daily.   SEMAGLUTIDE PO Take by mouth daily.   topiramate (TOPAMAX) 50 MG tablet Take 1 tablet (50 mg total) by mouth 2 (two) times daily.   [DISCONTINUED] meloxicam (MOBIC) 15 MG tablet Take 15 mg by mouth daily.   [DISCONTINUED] tirzepatide (ZEPBOUND) 7.5  MG/0.5ML Pen Inject 7.5 mg into the skin once a week.     Allergies:   Prednisolone, Naltrexone-bupropion hcl er, and Phentermine hcl   Social History   Socioeconomic History   Marital status: Married    Spouse name: Not on file   Number of children: Not on file   Years of education: Not on file   Highest education level: Not on file  Occupational History   Occupation: customer service rep  Tobacco Use   Smoking status: Never  Smokeless tobacco: Never  Vaping Use   Vaping status: Never Used  Substance and Sexual Activity   Alcohol use: Yes    Alcohol/week: 2.0 - 3.0 standard drinks of alcohol    Types: 2 - 3 Glasses of wine per week   Drug use: No   Sexual activity: Not on file    Comment: Menarche age [age], G34, P2, 1 miscarriage, Parity 28 or 29, no BC  Other Topics Concern   Not on file  Social History Narrative   Menarche age 18-10, G69, P55, 1 miscarriage, Parity 17 or 30, no BC   Social Determinants of Corporate investment banker Strain: Not on file  Food Insecurity: Not on file  Transportation Needs: Not on file  Physical Activity: Not on file  Stress: Not on file  Social Connections: Not on file     Family History: The patient's family history includes Anxiety disorder in her mother; Cancer in her father; Heart disease in her mother; Obesity in her mother; Skin cancer in her father and sister; Stroke in her father and mother; Sudden death in her mother; Uterine cancer (age of onset: 62) in her sister.  ROS:   Please see the history of present illness.     All other systems reviewed and are negative.  EKGs/Labs/Other Studies Reviewed:    The following studies were reviewed today:   EKG:   06/27/2022: Sinus bradycardia, rate 58, nonspecific T wave flattening   Recent Labs: 08/26/2022: ALT 20; BUN 15; Creatinine, Ser 0.91; Potassium 4.2; Sodium 141  Recent Lipid Panel    Component Value Date/Time   CHOL 166 08/26/2022 0838   TRIG 124 08/26/2022 0838    HDL 55 08/26/2022 0838   CHOLHDL 3.0 08/26/2022 0838   LDLCALC 89 08/26/2022 0838    Physical Exam:    VS:  BP 128/88 (BP Location: Right Arm, Patient Position: Sitting, Cuff Size: Large)   Pulse (!) 55   Resp (!) 92   Ht 5\' 7"  (1.702 m)   Wt 261 lb (118.4 kg)   LMP 03/11/2018 (Exact Date)   BMI 40.88 kg/m     Wt Readings from Last 3 Encounters:  12/19/22 261 lb (118.4 kg)  06/27/22 249 lb (112.9 kg)  04/25/22 241 lb (109.3 kg)     GEN:  Well nourished, well developed in no acute distress  NECK: No JVD; No carotid bruits CARDIAC: RRR, no murmurs, rubs, gallops RESPIRATORY:  Clear to auscultation without rales, wheezing or rhonchi  ABDOMEN: Soft, non-tender, non-distended MUSCULOSKELETAL:  No edema; No deformity  SKIN: Warm and dry NEUROLOGIC:  Alert and oriented x 3 PSYCHIATRIC:  Normal affect   ASSESSMENT:    1. Coronary artery disease involving native coronary artery of native heart without angina pectoris   2. OSA (obstructive sleep apnea)   3. Palpitations   4. Essential hypertension   5. Hyperlipidemia LDL goal <70      PLAN:    CAD: Reported atypical chest pain.  Echocardiogram 03/09/2020 showed normal biventricular function, mild LVH, no significant valvular disease.  Coronary CTA on 03/20/2020 showed calcium score 0, nonobstructive CAD with minimal stenosis in proximal RCA.  Intolerance of statins or Zetia.  Palpitations: Zio patch x 13 days in 04/2020 showed 11 episodes of SVT with longest lasting 17 beats, no significant arrhythmias -Continue Kard[employer] for long term monitoring  Hypertension: On HCTZ 25 mg daily, losartan 50 mg daily.  Also takes propranolol 20 mg once per day for anxiety.  Appears controlled  Hyperlipidemia: LDL 158 on 02/16/2022.  Given nonobstructive CAD on coronary CTA as above, started rosuvastatin 10 mg daily.  LDL improved to 89 on 08/26/2022, but she reported unable to tolerate rosuvastatin due to myalgias.  Switch to atorvastatin 20  mg daily but also had myalgais.  Did not tolerate zetia.  Diet/exercise recommended  Leg pain: Normal ABIs on 03/11/2020.  OSA: Positive sleep study on 03/23/2020.  She did not start CPAP.  She reports having daytime somnolence.  Repeat sleep study 07/2022 showed moderate OSA.  Planning CPAP titration study this week  Obesity: Body mass index is 40.88 kg/m.  Previously followed with [facility].  Diet/exercise recommended  RTC in 1 year   Medication Adjustments/Labs and Tests Ordered: Current medicines are reviewed at length with the patient today.  Concerns regarding medicines are outlined above.  No orders of the defined types were placed in this encounter.  No orders of the defined types were placed in this encounter.   Patient Instructions  Medication Instructions:  Continue current medication *If you need a refill on your cardiac medications before your next appointment, please call your pharmacy*   Lab Work: none If you have labs (blood work) drawn today and your tests are completely normal, you will receive your results only by: MyChart Message (if you have MyChart) OR A paper copy in the mail If you have any lab test that is abnormal or we need to change your treatment, we will call you to review the results.   Testing/Procedures: none   Follow-Up: At [facility], you and your health needs are our priority.  As part of our continuing mission to provide you with exceptional heart care, we have created designated Provider Care Teams.  These Care Teams include your primary Cardiologist (physician) and Advanced Practice Providers (APPs -  Physician Assistants and Nurse Practitioners) who all work together to provide you with the care you need, when you need it.  We recommend signing up for the patient portal called "MyChart".  Sign up information is provided on this After Visit Summary.  MyChart is used to connect with patients for Virtual Visits  (Telemedicine).  Patients are able to view lab/test results, encounter notes, upcoming appointments, etc.  Non-urgent messages can be sent to your provider as well.   To learn more about what you can do with MyChart, go to [email or web address].    Your next appointment:   1 year(s). Call office in May 2025 to schedule  Provider:   Little Ishikawa, MD          Signed, Little Ishikawa, MD  12/20/2022 1:24 PM    [facility]

## 2022-12-19 ENCOUNTER — Encounter: Payer: Self-pay | Admitting: Cardiology

## 2022-12-19 ENCOUNTER — Ambulatory Visit: Payer: BC Managed Care – PPO | Attending: Cardiology | Admitting: Cardiology

## 2022-12-19 VITALS — BP 128/88 | HR 55 | Resp 92 | Ht 67.0 in | Wt 261.0 lb

## 2022-12-19 DIAGNOSIS — G4733 Obstructive sleep apnea (adult) (pediatric): Secondary | ICD-10-CM | POA: Diagnosis not present

## 2022-12-19 DIAGNOSIS — E785 Hyperlipidemia, unspecified: Secondary | ICD-10-CM

## 2022-12-19 DIAGNOSIS — I10 Essential (primary) hypertension: Secondary | ICD-10-CM | POA: Diagnosis not present

## 2022-12-19 DIAGNOSIS — R002 Palpitations: Secondary | ICD-10-CM

## 2022-12-19 DIAGNOSIS — I2510 Atherosclerotic heart disease of native coronary artery without angina pectoris: Secondary | ICD-10-CM | POA: Diagnosis not present

## 2022-12-19 NOTE — Patient Instructions (Signed)
Medication Instructions:  Continue current medication *If you need a refill on your cardiac medications before your next appointment, please call your pharmacy*   Lab Work: none If you have labs (blood work) drawn today and your tests are completely normal, you will receive your results only by: MyChart Message (if you have MyChart) OR A paper copy in the mail If you have any lab test that is abnormal or we need to change your treatment, we will call you to review the results.   Testing/Procedures: none   Follow-Up: At [facility], you and your health needs are our priority.  As part of our continuing mission to provide you with exceptional heart care, we have created designated Provider Care Teams.  These Care Teams include your primary Cardiologist (physician) and Advanced Practice Providers (APPs -  Physician Assistants and Nurse Practitioners) who all work together to provide you with the care you need, when you need it.  We recommend signing up for the patient portal called "MyChart".  Sign up information is provided on this After Visit Summary.  MyChart is used to connect with patients for Virtual Visits (Telemedicine).  Patients are able to view lab/test results, encounter notes, upcoming appointments, etc.  Non-urgent messages can be sent to your provider as well.   To learn more about what you can do with MyChart, go to [email or web address].    Your next appointment:   1 year(s). Call office in May 2025 to schedule  Provider:   Little Ishikawa, MD

## 2022-12-30 DIAGNOSIS — G4761 Periodic limb movement disorder: Secondary | ICD-10-CM | POA: Diagnosis not present

## 2022-12-30 DIAGNOSIS — G4736 Sleep related hypoventilation in conditions classified elsewhere: Secondary | ICD-10-CM | POA: Diagnosis not present

## 2022-12-30 DIAGNOSIS — G4733 Obstructive sleep apnea (adult) (pediatric): Secondary | ICD-10-CM | POA: Diagnosis not present

## 2023-01-05 DIAGNOSIS — M1712 Unilateral primary osteoarthritis, left knee: Secondary | ICD-10-CM | POA: Diagnosis not present

## 2023-01-23 DIAGNOSIS — Z6841 Body Mass Index (BMI) 40.0 and over, adult: Secondary | ICD-10-CM | POA: Diagnosis not present

## 2023-01-23 DIAGNOSIS — I10 Essential (primary) hypertension: Secondary | ICD-10-CM | POA: Diagnosis not present

## 2023-01-23 DIAGNOSIS — G4733 Obstructive sleep apnea (adult) (pediatric): Secondary | ICD-10-CM | POA: Diagnosis not present

## 2023-02-20 DIAGNOSIS — Z13220 Encounter for screening for lipoid disorders: Secondary | ICD-10-CM | POA: Diagnosis not present

## 2023-02-20 DIAGNOSIS — Z9884 Bariatric surgery status: Secondary | ICD-10-CM | POA: Diagnosis not present

## 2023-02-20 DIAGNOSIS — F419 Anxiety disorder, unspecified: Secondary | ICD-10-CM | POA: Diagnosis not present

## 2023-02-20 DIAGNOSIS — Z0000 Encounter for general adult medical examination without abnormal findings: Secondary | ICD-10-CM | POA: Diagnosis not present

## 2023-02-20 DIAGNOSIS — Z23 Encounter for immunization: Secondary | ICD-10-CM | POA: Diagnosis not present

## 2023-02-20 DIAGNOSIS — I10 Essential (primary) hypertension: Secondary | ICD-10-CM | POA: Diagnosis not present

## 2023-02-24 DIAGNOSIS — G4733 Obstructive sleep apnea (adult) (pediatric): Secondary | ICD-10-CM | POA: Diagnosis not present

## 2023-03-16 DIAGNOSIS — G4733 Obstructive sleep apnea (adult) (pediatric): Secondary | ICD-10-CM | POA: Diagnosis not present

## 2023-03-24 DIAGNOSIS — Z6841 Body Mass Index (BMI) 40.0 and over, adult: Secondary | ICD-10-CM | POA: Diagnosis not present

## 2023-03-24 DIAGNOSIS — I10 Essential (primary) hypertension: Secondary | ICD-10-CM | POA: Diagnosis not present

## 2023-03-24 DIAGNOSIS — G4733 Obstructive sleep apnea (adult) (pediatric): Secondary | ICD-10-CM | POA: Diagnosis not present

## 2023-03-27 DIAGNOSIS — G4733 Obstructive sleep apnea (adult) (pediatric): Secondary | ICD-10-CM | POA: Diagnosis not present

## 2023-04-24 DIAGNOSIS — M1712 Unilateral primary osteoarthritis, left knee: Secondary | ICD-10-CM | POA: Diagnosis not present

## 2023-04-24 DIAGNOSIS — Z6841 Body Mass Index (BMI) 40.0 and over, adult: Secondary | ICD-10-CM | POA: Diagnosis not present

## 2023-04-24 DIAGNOSIS — Z23 Encounter for immunization: Secondary | ICD-10-CM | POA: Diagnosis not present

## 2023-04-24 DIAGNOSIS — Z713 Dietary counseling and surveillance: Secondary | ICD-10-CM | POA: Diagnosis not present

## 2023-04-27 DIAGNOSIS — G4733 Obstructive sleep apnea (adult) (pediatric): Secondary | ICD-10-CM | POA: Diagnosis not present

## 2023-05-25 DIAGNOSIS — G4733 Obstructive sleep apnea (adult) (pediatric): Secondary | ICD-10-CM | POA: Diagnosis not present

## 2023-06-02 ENCOUNTER — Other Ambulatory Visit: Payer: Self-pay | Admitting: Cardiology

## 2023-06-22 DIAGNOSIS — M170 Bilateral primary osteoarthritis of knee: Secondary | ICD-10-CM | POA: Diagnosis not present

## 2023-06-23 DIAGNOSIS — G4733 Obstructive sleep apnea (adult) (pediatric): Secondary | ICD-10-CM | POA: Diagnosis not present

## 2023-06-23 DIAGNOSIS — F419 Anxiety disorder, unspecified: Secondary | ICD-10-CM | POA: Diagnosis not present

## 2023-06-23 DIAGNOSIS — Z6841 Body Mass Index (BMI) 40.0 and over, adult: Secondary | ICD-10-CM | POA: Diagnosis not present

## 2023-06-23 DIAGNOSIS — I10 Essential (primary) hypertension: Secondary | ICD-10-CM | POA: Diagnosis not present

## 2023-06-26 ENCOUNTER — Telehealth: Payer: Self-pay

## 2023-06-26 NOTE — Telephone Encounter (Signed)
 Spoke with patient who is agreeable to do a tele visit on 5/30 at 9 am. Med rec to be completed and consent has been done.

## 2023-06-26 NOTE — Telephone Encounter (Signed)
   Pre-operative Risk Assessment    Patient Name: [name] [name]  DOB: 29-Jun-1965 MRN: [id number]   Date of last office visit: 12/19/22 [name], MD Date of next office visit: NONE   Request for Surgical Clearance    Procedure:   LEFT KNEE ARTHROPLASTY  Date of Surgery:  Clearance 09/07/23                                Surgeon:  DR. MATTHEW OLLN Surgeon's Group or Practice Name:  [facility] Phone number:  [phone] Fax number:  [phone]  ATTN: Valinda Gault WILLS   Type of Clearance Requested:   - Medical    Type of Anesthesia:  Spinal   Additional requests/questions:    SignedCollin Deal   06/26/2023, 12:07 PM

## 2023-06-26 NOTE — Telephone Encounter (Signed)
  Patient Consent for Virtual Visit        [name] [name] has provided verbal consent on 06/26/2023 for a virtual visit (video or telephone).   CONSENT FOR VIRTUAL VISIT FOR:  [name] [name]  By participating in this virtual visit I agree to the following:  I hereby voluntarily request, consent and authorize [facility] and its employed or contracted physicians, physician assistants, nurse practitioners or other licensed health care professionals (the Practitioner), to provide me with telemedicine health care services (the "Services") as deemed necessary by the treating Practitioner. I acknowledge and consent to receive the Services by the Practitioner via telemedicine. I understand that the telemedicine visit will involve communicating with the Practitioner through live audiovisual communication technology and the disclosure of certain medical information by electronic transmission. I acknowledge that I have been given the opportunity to request an in-person assessment or other available alternative prior to the telemedicine visit and am voluntarily participating in the telemedicine visit.  I understand that I have the right to withhold or withdraw my consent to the use of telemedicine in the course of my care at any time, without affecting my right to future care or treatment, and that the Practitioner or I may terminate the telemedicine visit at any time. I understand that I have the right to inspect all information obtained and/or recorded in the course of the telemedicine visit and may receive copies of available information for a reasonable fee.  I understand that some of the potential risks of receiving the Services via telemedicine include:  Delay or interruption in medical evaluation due to technological equipment failure or disruption; Information transmitted may not be sufficient (e.g. poor resolution of images) to allow for appropriate medical decision making by  the Practitioner; and/or  In rare instances, security protocols could fail, causing a breach of personal health information.  Furthermore, I acknowledge that it is my responsibility to provide information about my medical history, conditions and care that is complete and accurate to the best of my ability. I acknowledge that Practitioner's advice, recommendations, and/or decision may be based on factors not within their control, such as incomplete or inaccurate data provided by me or distortions of diagnostic images or specimens that may result from electronic transmissions. I understand that the practice of medicine is not an exact science and that Practitioner makes no warranties or guarantees regarding treatment outcomes. I acknowledge that a copy of this consent can be made available to me via my patient portal [facility] MyChart), or I can request a printed copy by calling the office of [facility].    I understand that my insurance will be billed for this visit.   I have read or had this consent read to me. I understand the contents of this consent, which adequately explains the benefits and risks of the Services being provided via telemedicine.  I have been provided ample opportunity to ask questions regarding this consent and the Services and have had my questions answered to my satisfaction. I give my informed consent for the services to be provided through the use of telemedicine in my medical care

## 2023-06-26 NOTE — Telephone Encounter (Signed)
   Name: [name] [name]  DOB: [birth year]  MRN: [id number]  Primary Cardiologist: Wendie Hamburg, MD   Preoperative team, please contact this patient and set up a phone call appointment for further preoperative risk assessment. Please obtain consent and complete medication review. Thank you for your help.  I confirm that guidance regarding antiplatelet and oral anticoagulation therapy has been completed and, if necessary, noted below.  Patient is not on an anticoagulant or antiplatelet therapy.  I also confirmed the patient resides in the state of [place] . As per [facility] Medical Board telemedicine laws, the patient must reside in the state in which the provider is licensed.   Ava Boatman, NP 06/26/2023, 12:24 PM [facility]

## 2023-07-31 DIAGNOSIS — I10 Essential (primary) hypertension: Secondary | ICD-10-CM | POA: Diagnosis not present

## 2023-07-31 DIAGNOSIS — M1712 Unilateral primary osteoarthritis, left knee: Secondary | ICD-10-CM | POA: Diagnosis not present

## 2023-08-11 ENCOUNTER — Ambulatory Visit
Admission: RE | Admit: 2023-08-11 | Discharge: 2023-08-11 | Disposition: A | Discharge status: Home / Self Care | Source: Ambulatory Visit | Attending: Family Medicine | Admitting: Family Medicine

## 2023-08-11 ENCOUNTER — Ambulatory Visit: Attending: Cardiology | Admitting: Nurse Practitioner

## 2023-08-11 ENCOUNTER — Ambulatory Visit (INDEPENDENT_AMBULATORY_CARE_PROVIDER_SITE_OTHER)

## 2023-08-11 VITALS — BP 128/88 | HR 51 | Temp 97.8°F | Resp 19

## 2023-08-11 DIAGNOSIS — Z01810 Encounter for preprocedural cardiovascular examination: Secondary | ICD-10-CM

## 2023-08-11 DIAGNOSIS — M545 Low back pain, unspecified: Secondary | ICD-10-CM | POA: Diagnosis not present

## 2023-08-11 DIAGNOSIS — M5442 Lumbago with sciatica, left side: Secondary | ICD-10-CM

## 2023-08-11 DIAGNOSIS — M48061 Spinal stenosis, lumbar region without neurogenic claudication: Secondary | ICD-10-CM | POA: Diagnosis not present

## 2023-08-11 DIAGNOSIS — M47816 Spondylosis without myelopathy or radiculopathy, lumbar region: Secondary | ICD-10-CM | POA: Diagnosis not present

## 2023-08-11 MED ORDER — NAPROXEN 375 MG PO TABS
375.0000 mg | ORAL_TABLET | Freq: Two times a day (BID) | ORAL | 0 refills | Status: AC | PRN
Start: 2023-08-11 — End: ?

## 2023-08-11 MED ORDER — CYCLOBENZAPRINE HCL 10 MG PO TABS
10.0000 mg | ORAL_TABLET | Freq: Two times a day (BID) | ORAL | 0 refills | Status: AC | PRN
Start: 2023-08-11 — End: ?

## 2023-08-11 MED ORDER — LIDOCAINE 5 % EX PTCH
1.0000 | MEDICATED_PATCH | CUTANEOUS | 0 refills | Status: AC
Start: 2023-08-11 — End: ?

## 2023-08-11 NOTE — Progress Notes (Signed)
 Virtual Visit via Telephone Note   Because of [name] [name] co-morbid illnesses, she is at least at moderate risk for complications without adequate follow up.  This format is felt to be most appropriate for this patient at this time.  Due to technical limitations with video connection (technology), today's appointment will be conducted as an audio only telehealth visit, and [name] [name] verbally agreed to proceed in this manner.   All issues noted in this document were discussed and addressed.  No physical exam could be performed with this format.  Evaluation Performed:  Preoperative cardiovascular risk assessment _____________   Date:  08/11/2023   Patient ID:  [name] [name], DOB 1965/12/16, MRN [id number] Patient Location:  Home Provider location:   Office  Primary Care Provider:  Perley Bradley, MD Primary Cardiologist:  Wendie Hamburg, MD  Chief Complaint / Patient Profile   58 y.o. y/o female with a h/o nonobstructive CAD, PSVT, hypertension, hyperlipidemia, OSA, and obesity who is pending left knee arthroplasty on 09/07/2023 with Dr. Claiborne Crew of [facility] and presents today for telephonic preoperative cardiovascular risk assessment.  History of Present Illness    [name] [name] is a 58 y.o. female who presents via audio/video conferencing for a telehealth visit today.  Pt was last seen in cardiology clinic on 12/19/2022 by Dr. Alda Amas.  At that time [name] was doing well.  The patient is now pending procedure as outlined above. Since her last visit, she has done well from a cardiac standpoint.  She denies chest pain, palpitations, dyspnea, pnd, orthopnea, n, v, dizziness, syncope, edema, weight gain, or early satiety. All other systems reviewed and are otherwise negative except as noted above.   Past Medical History    Past Medical History:  Diagnosis Date   Anemia    not at present   Anxiety    Arthritis     Osteoarthritis Neck and Back   Back pain    Breast cancer (HCC)    Cancer (HCC) 2013   Left breast cancer   Chest pain    Depression    DJD (degenerative joint disease)    Cervical and lumbar spin   Family history of uterine cancer    sister   GERD (gastroesophageal reflux disease)    Hot flashes    Hx of seizure disorder    Patient denies this dx - pt states "never had a seizure"   Hypertension    Hx prior to lap band surgery 10 yrs ago, lost weight, no BP issues since surgery, no medication   Joint pain    Lower extremity edema    Obesity    Personal history of radiation therapy    2017 left breast   Prediabetes    S/P radiation therapy 06/13/2011 - 07/22/11   Left Breast/ 50 Gy in 25 Fractions: Left Breast Boost Boost/ 10 Gy in 5 Fractions   Seasonal allergies    Status post biopsy 03/21/11   Breast, Left, Needle Core Biopsy, Upper Central - DCIS, Grade I-III with comedo Necrosis and Calcifications. ER+, PR+, Ki-67 10%, Her2- No Amplification   Past Surgical History:  Procedure Laterality Date   ABDOMINAL HYSTERECTOMY     BIOPSY BREAST  08/07/09   Breast, Right Needle core Biopsy, UOQ - Pseudoangiomatous Stromal hyperplasia   BREAST BIOPSY  03/21/11   BREAST LUMPECTOMY  04/19/11   snbx   BREAST LUMPECTOMY  05/10/11   Left Breast Lumpectomy re-excision   BREAST REDUCTION  SURGERY  02/15/2012   Procedure: MAMMARY REDUCTION  (BREAST);  Surgeon: Marilou Showman, DO;  Location: [facility];  Service: Plastics;  Laterality: Right;  RIGHT BREAST MASTOPEXY   CESAREAN SECTION  1999, 2005   x 2   COLONOSCOPY     CYSTOSCOPY N/A 03/20/2018   Procedure: CYSTOSCOPY;  Surgeon: Rogene Claude, MD;  Location: [facility];  Service: Gynecology;  Laterality: N/A;   DILATION AND CURETTAGE OF UTERUS     X 1   LAPAROSCOPIC GASTRIC BANDING  01/27/2009   REDUCTION MAMMAPLASTY Right    TUBAL LIGATION      Allergies  Allergies  Allergen Reactions   Prednisolone Hives    Naltrexone-Bupropion Hcl Er Other (See Comments)   Phentermine Hcl Other (See Comments)    Home Medications    Prior to Admission medications   Medication Sig Start Date End Date Taking? Authorizing Provider  Bacillus Coagulans-Inulin (PROBIOTIC-PREBIOTIC PO) Take by mouth.    [provider]  celecoxib (CELEBREX) 200 MG capsule Take 200 mg by mouth as needed. 07/28/22   [provider]  cetirizine (ZYRTEC) 10 MG tablet Take 10 mg by mouth daily. 12/30/11   [provider]  citalopram  (CELEXA ) 40 MG tablet Take 1 tablet (40 mg total) by mouth daily. 09/06/18   Magrinat, Rozella Cornfield, MD  diclofenac  (VOLTAREN ) 0.1 % ophthalmic solution  02/25/20   [provider]  ezetimibe  (ZETIA ) 10 MG tablet Take 1 tablet (10 mg total) by mouth daily. 10/17/22 01/15/23  Wendie Hamburg, MD  ferrous sulfate 325 (65 FE) MG EC tablet Take 325 mg by mouth daily with breakfast.    [provider]  hydrochlorothiazide  (HYDRODIURIL ) 25 MG tablet Take 1 tablet (25 mg total) by mouth daily. 09/25/19   Magrinat, Rozella Cornfield, MD  lansoprazole  (PREVACID ) 30 MG capsule Take 30 mg by mouth 2 (two) times a week. 04/19/18   Magrinat, Rozella Cornfield, MD  losartan  (COZAAR ) 50 MG tablet TAKE 1 TABLET DAILY 06/02/23   Wendie Hamburg, MD  Misc Natural Products (GLUCOSAMINE CHOND CMP ADVANCED) TABS Take by mouth.    [provider]  Multiple Vitamins-Minerals (MULTIVITAMIN ADULT) CHEW 1 tablet    [provider]  Omega-3 Fatty Acids (FISH OIL) 500 MG CAPS Take by mouth.    [provider]  propranolol  (INDERAL ) 20 MG tablet Take 1 tablet (20 mg total) by mouth 3 (three) times daily. 03/28/22   Helane Lloyd, FNP  SEMAGLUTIDE  PO Take by mouth daily.    [provider]  topiramate  (TOPAMAX ) 50 MG tablet Take 1 tablet (50 mg total) by mouth 2 (two) times daily. 01/03/22   Helane Lloyd, FNP    Physical Exam    Vital Signs:  [name] does not have vital signs available for review today.  Given telephonic nature of communication, physical exam is limited. AAOx3. NAD. Normal affect.  Speech and respirations are unlabored.  Accessory Clinical Findings    None  Assessment & Plan    1.  Preoperative Cardiovascular Risk Assessment:  According to the Revised Cardiac Risk Index (RCRI), her Perioperative Risk of Major Cardiac Event is (%): 0.4. Her Functional Capacity in METs is: 6.27 according to the Duke Activity Status Index (DASI).Therefore, based on ACC/AHA guidelines, patient would be at acceptable risk for the planned procedure without further cardiovascular testing.   The patient was advised that if she develops new symptoms prior to surgery to contact our office to arrange for a  follow-up visit, and she verbalized understanding.  A copy of this note will be routed to requesting surgeon.  Time:   Today, I have spent 5 minutes with the patient with telehealth technology discussing medical history, symptoms, and management plan.     Jude Norton, NP  08/11/2023, 9:08 AM

## 2023-08-11 NOTE — Discharge Instructions (Addendum)
 You may start Flexeril  twice daily as needed.  Please note this medication will make you drowsy.  Do not drink alcohol or drive while on this medication.  You may start naproxen  twice daily as needed.  Do not take your Celebrex if you go to be taking the naproxen .  You may do the lidocaine  patch to the lower back.  Leave on for 12 hours and remove for 12 hours.  Lots of rest.  Follow-up with your PCP next week if symptoms or not improving.  Please go to the ER for any worsening symptoms.  Hope you feel better soon!

## 2023-08-11 NOTE — ED Provider Notes (Signed)
 [facility]    CSN: [id number] Arrival date & time: 08/11/23  1718      History   Chief Complaint Chief Complaint  Patient presents with   Back Pain    Pinched nerve in left side middle back.  Pain radiating down left hip into thigh.  This is the 3rd week of intense pain.  Have had back pain most adult life off and on but this is the longest case.  Would like to get a back xray also. - Entered by patient    HPI [name] [name] is a 58 y.o. female presents for back pain.  Patient reports 2 weeks of a persistent left lower back pain that radiates down to her left leg.  Denies any known injury or inciting event.  Denies numbness/tingling/weakness of her lower extremities, no bowel or bladder incontinence, no saddle paresthesia.  Reports a history of low back pain in the past but it has been several years.  Does report history of degenerative disc disease in her neck.  She has been taking OTC naproxen and Celebrex for her symptoms.  She has requested an x-ray of her back to see if her DDD has worsened.  No other concerns at this time.   Back Pain   Past Medical History:  Diagnosis Date   Anemia    not at present   Anxiety    Arthritis    Osteoarthritis Neck and Back   Back pain    Breast cancer (HCC)    Cancer (HCC) 2013   Left breast cancer   Chest pain    Depression    DJD (degenerative joint disease)    Cervical and lumbar spin   Family history of uterine cancer    sister   GERD (gastroesophageal reflux disease)    Hot flashes    Hx of seizure disorder    Patient denies this dx - pt states "never had a seizure"   Hypertension    Hx prior to lap band surgery 10 yrs ago, lost weight, no BP issues since surgery, no medication   Joint pain    Lower extremity edema    Obesity    Personal history of radiation therapy    2017 left breast   Prediabetes    S/P radiation therapy 06/13/2011 - 07/22/11   Left Breast/ 50 Gy in 25 Fractions: Left Breast Boost  Boost/ 10 Gy in 5 Fractions   Seasonal allergies    Status post biopsy 03/21/11   Breast, Left, Needle Core Biopsy, Upper Central - DCIS, Grade I-III with comedo Necrosis and Calcifications. ER+, PR+, Ki-67 10%, Her2- No Amplification    Patient Active Problem List   Diagnosis Date Noted   Hyperlipidemia 02/07/2022   Gastroesophageal reflux disease 03/29/2021   Hepatic steatosis 10/13/2020   Other constipation 09/21/2020   Depression 08/03/2020   Nocturnal sleep-related eating disorder 05/18/2020   GAD (generalized anxiety disorder) 05/18/2020   Class 3 severe obesity with serious comorbidity and body mass index (BMI) of 40.0 to 44.9 in adult 04/19/2018   S/P hysterectomy with oophorectomy 03/20/2018   Genetic testing 01/06/2017   Family history of uterine cancer    Hot flashes 10/29/2013   Anemia, unspecified 10/29/2013   Morbid obesity (HCC) 07/25/2013   History of laparoscopic adjustable gastric banding, 01/27/2009 05/24/2012   Malignant neoplasm of upper-outer quadrant of left breast in female, estrogen receptor positive (HCC) 04/05/2011    Past Surgical History:  Procedure Laterality Date   ABDOMINAL  HYSTERECTOMY     BIOPSY BREAST  08/07/09   Breast, Right Needle core Biopsy, UOQ - Pseudoangiomatous Stromal hyperplasia   BREAST BIOPSY  03/21/11   BREAST LUMPECTOMY  04/19/11   snbx   BREAST LUMPECTOMY  05/10/11   Left Breast Lumpectomy re-excision   BREAST REDUCTION SURGERY  02/15/2012   Procedure: MAMMARY REDUCTION  (BREAST);  Surgeon: Marilou Showman, DO;  Location: [facility];  Service: Plastics;  Laterality: Right;  RIGHT BREAST MASTOPEXY   CESAREAN SECTION  1999, 2005   x 2   COLONOSCOPY     CYSTOSCOPY N/A 03/20/2018   Procedure: CYSTOSCOPY;  Surgeon: Rogene Claude, MD;  Location: [facility];  Service: Gynecology;  Laterality: N/A;   DILATION AND CURETTAGE OF UTERUS     X 1   LAPAROSCOPIC GASTRIC BANDING  01/27/2009   REDUCTION MAMMAPLASTY Right    TUBAL  LIGATION      OB History     Gravida  3   Para  1   Term      Preterm      AB  1   Living  2      SAB      IAB      Ectopic      Multiple      Live Births               Home Medications    Prior to Admission medications   Medication Sig Start Date End Date Taking? Authorizing Provider  cyclobenzaprine (FLEXERIL) 10 MG tablet Take 1 tablet (10 mg total) by mouth 2 (two) times daily as needed for muscle spasms. 08/11/23  Yes Jalani Rominger, Jodi R, NP  lidocaine  (LIDODERM ) 5 % Place 1 patch onto the skin daily. Keep on the affected area for 12 hours and remove for 12 hours 08/11/23  Yes Nakshatra Klose, Jodi R, NP  naproxen (NAPROSYN) 375 MG tablet Take 1 tablet (375 mg total) by mouth 2 (two) times daily as needed. 08/11/23  Yes Tommye Lehenbauer, Jodi R, NP  Bacillus Coagulans-Inulin (PROBIOTIC-PREBIOTIC PO) Take by mouth.    [provider]  celecoxib (CELEBREX) 200 MG capsule Take 200 mg by mouth as needed. 07/28/22   [provider]  cetirizine (ZYRTEC) 10 MG tablet Take 10 mg by mouth daily. 12/30/11   [provider]  citalopram  (CELEXA ) 40 MG tablet Take 1 tablet (40 mg total) by mouth daily. 09/06/18   Magrinat, Rozella Cornfield, MD  diclofenac  (VOLTAREN ) 0.1 % ophthalmic solution  02/25/20   [provider]  ezetimibe  (ZETIA ) 10 MG tablet Take 1 tablet (10 mg total) by mouth daily. 10/17/22 01/15/23  Wendie Hamburg, MD  ferrous sulfate 325 (65 FE) MG EC tablet Take 325 mg by mouth daily with breakfast.    [provider]  hydrochlorothiazide  (HYDRODIURIL ) 25 MG tablet Take 1 tablet (25 mg total) by mouth daily. 09/25/19   Magrinat, Rozella Cornfield, MD  lansoprazole  (PREVACID ) 30 MG capsule Take 30 mg by mouth 2 (two) times a week. 04/19/18   Magrinat, Rozella Cornfield, MD  losartan  (COZAAR ) 50 MG tablet TAKE 1 TABLET DAILY 06/02/23   Wendie Hamburg, MD  Misc Natural Products (GLUCOSAMINE CHOND CMP ADVANCED) TABS Take by mouth.    [provider]   Multiple Vitamins-Minerals (MULTIVITAMIN ADULT) CHEW 1 tablet    [provider]  Omega-3 Fatty Acids (FISH OIL) 500 MG CAPS Take by mouth.    [provider]  propranolol  (INDERAL ) 20 MG tablet  Take 1 tablet (20 mg total) by mouth 3 (three) times daily. 03/28/22   Helane Lloyd, FNP  SEMAGLUTIDE  PO Take by mouth daily.    [provider]  topiramate  (TOPAMAX ) 50 MG tablet Take 1 tablet (50 mg total) by mouth 2 (two) times daily. 01/03/22   Helane Lloyd, FNP    Family History Family History  Problem Relation Age of Onset   Stroke Mother        Stroke - mini   Heart disease Mother    Sudden death Mother    Anxiety disorder Mother    Obesity Mother    Cancer Father        Lymphoma - passed age 46   Skin cancer Father    Stroke Father    Skin cancer Sister    Uterine cancer Sister 56    Social History Social History   Tobacco Use   Smoking status: Never   Smokeless tobacco: Never  Vaping Use   Vaping status: Never Used  Substance Use Topics   Alcohol use: Yes    Alcohol/week: 2.0 - 3.0 standard drinks of alcohol    Types: 2 - 3 Glasses of wine per week   Drug use: No     Allergies   Prednisolone, Naltrexone-bupropion hcl er, and Phentermine hcl   Review of Systems Review of Systems  Musculoskeletal:  Positive for back pain.     Physical Exam Triage Vital Signs ED Triage Vitals  Encounter Vitals Group     BP 08/11/23 1724 128/88     Systolic BP Percentile --      Diastolic BP Percentile --      Pulse Rate 08/11/23 1724 (!) 51     Resp 08/11/23 1724 19     Temp 08/11/23 1724 97.8 F (36.6 C)     Temp src --      SpO2 08/11/23 1724 98 %     Weight --      Height --      Head Circumference --      Peak Flow --      Pain Score 08/11/23 1723 10     Pain Loc --      Pain Education --      Exclude from Growth Chart --    No data found.  Updated Vital Signs BP 128/88   Pulse (!) 51   Temp 97.8 F (36.6 C)    Resp 19   LMP 03/11/2018 (Exact Date)   SpO2 98%   Visual Acuity Right Eye Distance:   Left Eye Distance:   Bilateral Distance:    Right Eye Near:   Left Eye Near:    Bilateral Near:     Physical Exam Vitals and nursing note reviewed.  Constitutional:      General: She is not in acute distress.    Appearance: Normal appearance. She is not ill-appearing.  HENT:     Head: Normocephalic and atraumatic.  Eyes:     Pupils: Pupils are equal, round, and reactive to light.  Cardiovascular:     Rate and Rhythm: Bradycardia present.  Pulmonary:     Effort: Pulmonary effort is normal.  Musculoskeletal:     Lumbar back: Tenderness present. No swelling, edema, deformity, signs of trauma, lacerations, spasms or bony tenderness. Normal range of motion. Positive left straight leg raise test. Negative right straight leg raise test. No scoliosis.       Back:     Comments: Strength 5 out  of 5 bilateral lower extremities  Skin:    General: Skin is warm and dry.  Neurological:     General: No focal deficit present.     Mental Status: She is alert and oriented to person, place, and time.     Deep Tendon Reflexes:     Reflex Scores:      Patellar reflexes are 2+ on the right side and 2+ on the left side. Psychiatric:        Mood and Affect: Mood normal.        Behavior: Behavior normal.      UC Treatments / Results  Labs (all labs ordered are listed, but only abnormal results are displayed) Labs Reviewed - No data to display  ontains abnormal data Comprehensive metabolic panel Order: [id number]  Status: Final result     Next appt: None     Dx: Coronary artery disease involving nat...   Test Result Released: Yes (seen)   4 Result Notes          Component Ref Range & Units (hover) 11 mo ago (08/26/22) 1 yr ago (09/20/21) 2 yr ago (02/10/21) 2 yr ago (02/10/21) 2 yr ago (02/10/21) 2 yr ago (10/13/20) 3 yr ago (03/27/20)  Glucose 82 80   79 R 89 CM   BUN 15 13   12  R 14 R    Creatinine, Ser 0.91 0.89   1.0 R 0.92 R   eGFR 74 76       BUN/Creatinine Ratio 16 15       Sodium 141 140   141 R 138 R   Potassium 4.2 3.9   4.2 R 4.5 R   Chloride 101 99   102 R 99 R   CO2 27 27   33 Abnormal  R 31 R   Calcium  9.6 9.5  9.9 R  9.8 R   Total Protein 7.1 7.0    7.4 R 7.5  Albumin  4.5 4.4 CM  4.4 R  3.9 R 4.7  Globulin, Total 2.6 2.6       Albumin /Globulin Ratio 1.7 1.7 R       Bilirubin Total 0.4 0.4    0.5 R 0.3  Alkaline Phosphatase 26 Low  27 Low  26 R   25 Low  R 32 Low  CM  AST 19 17 18  R   17 R 25  ALT 20 16 23  R   20 R 38 High   Resulting Agency [facility]         Narrative Performed by: Trenia Fritter Performed at:  [address] [facility] [address] [place], [place]  [id number] Lab Director: Pearlean Botts MD, Phone:  [phone]  Specimen Collected: 08/26/22 08:38 Last Resulted: 08/27/22 00:35    EKG   Radiology No results found.  Procedures Procedures (including critical care time)  Medications Ordered in UC Medications - No data to display  Initial Impression / Assessment and Plan / UC Course  I have reviewed the triage vital signs and the nursing notes.  Pertinent labs & imaging results that were available during my care of the patient were reviewed by me and considered in my medical decision making (see chart for details).     I reviewed exam and symptoms with patient.  No red flags.  Read of x-ray without any obvious fracture, will contact patient for any pertinent results based on radiology overread once available.  Unable to do steroids for  her sciatica given her allergy to prednisone.  Will do trial of naproxen, Lidoderm  patch, and Flexeril.  Discussed lots of rest and follow-up with PCP next week if symptoms do not improve.  ER precautions reviewed and patient verbalized understanding. Final Clinical Impressions(s) / UC Diagnoses   Final diagnoses:  Acute left-sided low back pain with  left-sided sciatica     Discharge Instructions      You may start Flexeril twice daily as needed.  Please note this medication will make you drowsy.  Do not drink alcohol or drive while on this medication.  You may start naproxen twice daily as needed.  Do not take your Celebrex if you go to be taking the naproxen.  You may do the lidocaine  patch to the lower back.  Leave on for 12 hours and remove for 12 hours.  Lots of rest.  Follow-up with your PCP next week if symptoms or not improving.  Please go to the ER for any worsening symptoms.  Hope you feel better soon!   ED Prescriptions     Medication Sig Dispense Auth. Provider   cyclobenzaprine (FLEXERIL) 10 MG tablet Take 1 tablet (10 mg total) by mouth 2 (two) times daily as needed for muscle spasms. 10 tablet Jayion Schneck, Jodi R, NP   lidocaine  (LIDODERM ) 5 % Place 1 patch onto the skin daily. Keep on the affected area for 12 hours and remove for 12 hours 15 patch Sharmon Cheramie, Jodi R, NP   naproxen (NAPROSYN) 375 MG tablet Take 1 tablet (375 mg total) by mouth 2 (two) times daily as needed. 14 tablet Caylen Kuwahara, Jodi R, NP      PDMP not reviewed this encounter.   Alleen Arbour, NP 08/11/23 [phone]

## 2023-08-11 NOTE — ED Triage Notes (Addendum)
 PT presents to [facility] with low back pain radiating down left side since 5/12. Pt has been taking naproxen and using ice and heat at home.

## 2023-08-12 ENCOUNTER — Ambulatory Visit: Payer: Self-pay | Admitting: Nurse Practitioner

## 2023-08-14 DIAGNOSIS — M5416 Radiculopathy, lumbar region: Secondary | ICD-10-CM | POA: Diagnosis not present

## 2023-08-15 DIAGNOSIS — M1712 Unilateral primary osteoarthritis, left knee: Secondary | ICD-10-CM | POA: Diagnosis not present

## 2023-08-17 ENCOUNTER — Other Ambulatory Visit: Payer: Self-pay | Admitting: Physical Medicine and Rehabilitation

## 2023-08-17 DIAGNOSIS — M545 Low back pain, unspecified: Secondary | ICD-10-CM

## 2023-08-18 ENCOUNTER — Ambulatory Visit
Admission: RE | Admit: 2023-08-18 | Discharge: 2023-08-18 | Disposition: A | Discharge status: Home / Self Care | Source: Ambulatory Visit | Attending: Physical Medicine and Rehabilitation | Admitting: Physical Medicine and Rehabilitation

## 2023-08-18 DIAGNOSIS — M5126 Other intervertebral disc displacement, lumbar region: Secondary | ICD-10-CM | POA: Diagnosis not present

## 2023-08-18 DIAGNOSIS — M545 Low back pain, unspecified: Secondary | ICD-10-CM

## 2023-08-18 DIAGNOSIS — M5442 Lumbago with sciatica, left side: Secondary | ICD-10-CM | POA: Diagnosis not present

## 2023-08-25 DIAGNOSIS — M5416 Radiculopathy, lumbar region: Secondary | ICD-10-CM | POA: Diagnosis not present

## 2023-09-14 DIAGNOSIS — M5416 Radiculopathy, lumbar region: Secondary | ICD-10-CM | POA: Diagnosis not present

## 2023-09-18 DIAGNOSIS — M545 Low back pain, unspecified: Secondary | ICD-10-CM | POA: Diagnosis not present

## 2023-09-28 DIAGNOSIS — M5416 Radiculopathy, lumbar region: Secondary | ICD-10-CM | POA: Diagnosis not present

## 2023-10-03 DIAGNOSIS — Z01419 Encounter for gynecological examination (general) (routine) without abnormal findings: Secondary | ICD-10-CM | POA: Diagnosis not present

## 2023-10-03 DIAGNOSIS — Z1231 Encounter for screening mammogram for malignant neoplasm of breast: Secondary | ICD-10-CM | POA: Diagnosis not present

## 2023-10-11 DIAGNOSIS — M545 Low back pain, unspecified: Secondary | ICD-10-CM | POA: Diagnosis not present

## 2023-10-12 DIAGNOSIS — Z96652 Presence of left artificial knee joint: Secondary | ICD-10-CM | POA: Diagnosis not present

## 2023-10-12 DIAGNOSIS — Z471 Aftercare following joint replacement surgery: Secondary | ICD-10-CM | POA: Diagnosis not present

## 2023-10-25 DIAGNOSIS — H524 Presbyopia: Secondary | ICD-10-CM | POA: Diagnosis not present

## 2023-11-08 DIAGNOSIS — M5416 Radiculopathy, lumbar region: Secondary | ICD-10-CM | POA: Diagnosis not present

## 2023-11-20 DIAGNOSIS — Z23 Encounter for immunization: Secondary | ICD-10-CM | POA: Diagnosis not present

## 2023-11-20 DIAGNOSIS — Z9884 Bariatric surgery status: Secondary | ICD-10-CM | POA: Diagnosis not present

## 2023-11-20 DIAGNOSIS — Z713 Dietary counseling and surveillance: Secondary | ICD-10-CM | POA: Diagnosis not present

## 2023-11-20 DIAGNOSIS — Z6837 Body mass index (BMI) 37.0-37.9, adult: Secondary | ICD-10-CM | POA: Diagnosis not present

## 2023-11-21 DIAGNOSIS — M5416 Radiculopathy, lumbar region: Secondary | ICD-10-CM | POA: Diagnosis not present

## 2023-12-25 DIAGNOSIS — E7800 Pure hypercholesterolemia, unspecified: Secondary | ICD-10-CM | POA: Diagnosis not present

## 2023-12-25 DIAGNOSIS — G4733 Obstructive sleep apnea (adult) (pediatric): Secondary | ICD-10-CM | POA: Diagnosis not present

## 2023-12-25 DIAGNOSIS — F419 Anxiety disorder, unspecified: Secondary | ICD-10-CM | POA: Diagnosis not present

## 2023-12-25 DIAGNOSIS — Z6841 Body Mass Index (BMI) 40.0 and over, adult: Secondary | ICD-10-CM | POA: Diagnosis not present

## 2023-12-25 DIAGNOSIS — I10 Essential (primary) hypertension: Secondary | ICD-10-CM | POA: Diagnosis not present

## 2024-01-18 DIAGNOSIS — M5416 Radiculopathy, lumbar region: Secondary | ICD-10-CM | POA: Diagnosis not present

## 2024-02-20 DIAGNOSIS — R059 Cough, unspecified: Secondary | ICD-10-CM | POA: Diagnosis not present

## 2024-02-20 DIAGNOSIS — J069 Acute upper respiratory infection, unspecified: Secondary | ICD-10-CM | POA: Diagnosis not present

## 2024-02-20 DIAGNOSIS — R0981 Nasal congestion: Secondary | ICD-10-CM | POA: Diagnosis not present

## 2024-03-01 DIAGNOSIS — Z13220 Encounter for screening for lipoid disorders: Secondary | ICD-10-CM | POA: Diagnosis not present

## 2024-03-01 DIAGNOSIS — Z23 Encounter for immunization: Secondary | ICD-10-CM | POA: Diagnosis not present

## 2024-03-01 DIAGNOSIS — I10 Essential (primary) hypertension: Secondary | ICD-10-CM | POA: Diagnosis not present

## 2024-03-01 DIAGNOSIS — K2100 Gastro-esophageal reflux disease with esophagitis, without bleeding: Secondary | ICD-10-CM | POA: Diagnosis not present

## 2024-03-01 DIAGNOSIS — Z131 Encounter for screening for diabetes mellitus: Secondary | ICD-10-CM | POA: Diagnosis not present

## 2024-03-01 DIAGNOSIS — Z0000 Encounter for general adult medical examination without abnormal findings: Secondary | ICD-10-CM | POA: Diagnosis not present

## 2024-03-01 DIAGNOSIS — F419 Anxiety disorder, unspecified: Secondary | ICD-10-CM | POA: Diagnosis not present

## 2024-04-10 ENCOUNTER — Other Ambulatory Visit: Payer: Self-pay | Admitting: Cardiology

## 2024-04-23 ENCOUNTER — Ambulatory Visit: Admitting: Physician Assistant
